# Patient Record
Sex: Female | Born: 1940 | Race: White | Hispanic: No | Smoking: Never smoker
Health system: Southern US, Community
[De-identification: ages and names within clinical notes are randomized; demographics above are authoritative.]

## PROBLEM LIST (undated history)

## (undated) DIAGNOSIS — Z9889 Other specified postprocedural states: Secondary | ICD-10-CM

## (undated) DIAGNOSIS — M1990 Unspecified osteoarthritis, unspecified site: Secondary | ICD-10-CM

## (undated) DIAGNOSIS — G709 Myoneural disorder, unspecified: Secondary | ICD-10-CM

## (undated) DIAGNOSIS — I480 Paroxysmal atrial fibrillation: Secondary | ICD-10-CM

## (undated) DIAGNOSIS — M5136 Other intervertebral disc degeneration, lumbar region: Secondary | ICD-10-CM

## (undated) DIAGNOSIS — M51369 Other intervertebral disc degeneration, lumbar region without mention of lumbar back pain or lower extremity pain: Secondary | ICD-10-CM

## (undated) DIAGNOSIS — I10 Essential (primary) hypertension: Secondary | ICD-10-CM

## (undated) DIAGNOSIS — R112 Nausea with vomiting, unspecified: Secondary | ICD-10-CM

## (undated) DIAGNOSIS — R7989 Other specified abnormal findings of blood chemistry: Secondary | ICD-10-CM

## (undated) HISTORY — DX: Unspecified osteoarthritis, unspecified site: M19.90

## (undated) HISTORY — PX: VEIN LIGATION AND STRIPPING: SHX2653

## (undated) HISTORY — PX: APPENDECTOMY: SHX54

## (undated) HISTORY — PX: TONSILLECTOMY: SHX5217

## (undated) HISTORY — PX: BUNIONECTOMY: SHX129

## (undated) HISTORY — PX: ABDOMINAL HYSTERECTOMY: SHX81

## (undated) HISTORY — DX: Myoneural disorder, unspecified: G70.9

## (undated) HISTORY — PX: TONSILLECTOMY: SUR1361

## (undated) HISTORY — PX: CHOLECYSTECTOMY: SHX55

## (undated) HISTORY — DX: Other specified abnormal findings of blood chemistry: R79.89

## (undated) HISTORY — DX: Essential (primary) hypertension: I10

## (undated) HISTORY — DX: Paroxysmal atrial fibrillation: I48.0

---

## 2000-10-24 ENCOUNTER — Ambulatory Visit (HOSPITAL_COMMUNITY): Admission: RE | Admit: 2000-10-24 | Discharge: 2000-10-24 | Payer: Self-pay | Admitting: Family Medicine

## 2000-10-24 ENCOUNTER — Encounter: Payer: Self-pay | Admitting: Family Medicine

## 2001-02-11 ENCOUNTER — Ambulatory Visit (HOSPITAL_BASED_OUTPATIENT_CLINIC_OR_DEPARTMENT_OTHER): Admission: RE | Admit: 2001-02-11 | Discharge: 2001-02-11 | Payer: Self-pay | Admitting: Podiatry

## 2001-09-08 ENCOUNTER — Encounter: Payer: Self-pay | Admitting: Family Medicine

## 2001-09-08 ENCOUNTER — Ambulatory Visit (HOSPITAL_COMMUNITY): Admission: RE | Admit: 2001-09-08 | Discharge: 2001-09-08 | Payer: Self-pay | Admitting: Family Medicine

## 2001-10-31 ENCOUNTER — Encounter: Payer: Self-pay | Admitting: Family Medicine

## 2001-10-31 ENCOUNTER — Ambulatory Visit (HOSPITAL_COMMUNITY): Admission: RE | Admit: 2001-10-31 | Discharge: 2001-10-31 | Payer: Self-pay | Admitting: Family Medicine

## 2002-01-21 ENCOUNTER — Encounter: Payer: Self-pay | Admitting: Otolaryngology

## 2002-01-21 ENCOUNTER — Ambulatory Visit (HOSPITAL_COMMUNITY): Admission: RE | Admit: 2002-01-21 | Discharge: 2002-01-21 | Payer: Self-pay | Admitting: Otolaryngology

## 2002-05-18 ENCOUNTER — Ambulatory Visit: Admission: RE | Admit: 2002-05-18 | Discharge: 2002-05-18 | Payer: Self-pay | Admitting: *Deleted

## 2002-05-18 ENCOUNTER — Encounter: Payer: Self-pay | Admitting: *Deleted

## 2002-11-03 ENCOUNTER — Encounter: Payer: Self-pay | Admitting: Family Medicine

## 2002-11-03 ENCOUNTER — Ambulatory Visit (HOSPITAL_COMMUNITY): Admission: RE | Admit: 2002-11-03 | Discharge: 2002-11-03 | Payer: Self-pay | Admitting: Family Medicine

## 2002-11-04 ENCOUNTER — Encounter: Payer: Self-pay | Admitting: Family Medicine

## 2002-11-04 ENCOUNTER — Ambulatory Visit (HOSPITAL_COMMUNITY): Admission: RE | Admit: 2002-11-04 | Discharge: 2002-11-04 | Payer: Self-pay | Admitting: Family Medicine

## 2003-11-15 ENCOUNTER — Ambulatory Visit (HOSPITAL_COMMUNITY): Admission: RE | Admit: 2003-11-15 | Discharge: 2003-11-15 | Payer: Self-pay | Admitting: Family Medicine

## 2004-11-15 ENCOUNTER — Ambulatory Visit (HOSPITAL_COMMUNITY): Admission: RE | Admit: 2004-11-15 | Discharge: 2004-11-15 | Payer: Self-pay | Admitting: Family Medicine

## 2005-02-16 ENCOUNTER — Encounter (INDEPENDENT_AMBULATORY_CARE_PROVIDER_SITE_OTHER): Payer: Self-pay | Admitting: Internal Medicine

## 2005-02-16 ENCOUNTER — Ambulatory Visit: Payer: Self-pay | Admitting: Internal Medicine

## 2005-02-16 ENCOUNTER — Ambulatory Visit (HOSPITAL_COMMUNITY): Admission: RE | Admit: 2005-02-16 | Discharge: 2005-02-16 | Payer: Self-pay | Admitting: Internal Medicine

## 2005-11-16 ENCOUNTER — Ambulatory Visit (HOSPITAL_COMMUNITY): Admission: RE | Admit: 2005-11-16 | Discharge: 2005-11-16 | Payer: Self-pay | Admitting: Family Medicine

## 2005-11-27 ENCOUNTER — Encounter: Admission: RE | Admit: 2005-11-27 | Discharge: 2005-11-27 | Payer: Self-pay | Admitting: Family Medicine

## 2006-11-20 ENCOUNTER — Encounter: Admission: RE | Admit: 2006-11-20 | Discharge: 2006-11-20 | Payer: Self-pay | Admitting: Family Medicine

## 2007-07-22 ENCOUNTER — Ambulatory Visit: Payer: Self-pay | Admitting: Orthopedic Surgery

## 2007-07-22 DIAGNOSIS — M722 Plantar fascial fibromatosis: Secondary | ICD-10-CM | POA: Insufficient documentation

## 2007-08-27 ENCOUNTER — Ambulatory Visit: Payer: Self-pay | Admitting: Orthopedic Surgery

## 2007-09-07 ENCOUNTER — Inpatient Hospital Stay (HOSPITAL_COMMUNITY): Admission: EM | Admit: 2007-09-07 | Discharge: 2007-09-08 | Payer: Self-pay | Admitting: Emergency Medicine

## 2007-09-07 ENCOUNTER — Ambulatory Visit: Payer: Self-pay | Admitting: Cardiology

## 2007-09-08 ENCOUNTER — Encounter (INDEPENDENT_AMBULATORY_CARE_PROVIDER_SITE_OTHER): Payer: Self-pay | Admitting: Internal Medicine

## 2007-09-11 ENCOUNTER — Encounter (HOSPITAL_COMMUNITY): Admission: RE | Admit: 2007-09-11 | Discharge: 2007-10-11 | Payer: Self-pay | Admitting: Cardiology

## 2007-09-11 ENCOUNTER — Ambulatory Visit: Payer: Self-pay | Admitting: Internal Medicine

## 2007-09-23 ENCOUNTER — Ambulatory Visit: Payer: Self-pay | Admitting: Cardiology

## 2007-10-14 ENCOUNTER — Ambulatory Visit: Payer: Self-pay | Admitting: Orthopedic Surgery

## 2007-11-21 ENCOUNTER — Encounter: Admission: RE | Admit: 2007-11-21 | Discharge: 2007-11-21 | Payer: Self-pay | Admitting: Family Medicine

## 2008-04-27 ENCOUNTER — Ambulatory Visit: Payer: Self-pay | Admitting: Cardiology

## 2008-08-02 ENCOUNTER — Ambulatory Visit: Payer: Self-pay | Admitting: Orthopedic Surgery

## 2008-10-06 ENCOUNTER — Ambulatory Visit: Payer: Self-pay | Admitting: Orthopedic Surgery

## 2008-10-13 ENCOUNTER — Encounter (HOSPITAL_COMMUNITY): Admission: RE | Admit: 2008-10-13 | Discharge: 2008-11-12 | Payer: Self-pay | Admitting: Orthopedic Surgery

## 2008-10-28 ENCOUNTER — Encounter: Payer: Self-pay | Admitting: Orthopedic Surgery

## 2008-11-08 ENCOUNTER — Ambulatory Visit: Payer: Self-pay | Admitting: Orthopedic Surgery

## 2008-11-30 ENCOUNTER — Encounter: Admission: RE | Admit: 2008-11-30 | Discharge: 2008-11-30 | Payer: Self-pay | Admitting: Family Medicine

## 2008-12-28 ENCOUNTER — Encounter (INDEPENDENT_AMBULATORY_CARE_PROVIDER_SITE_OTHER): Payer: Self-pay | Admitting: *Deleted

## 2008-12-28 LAB — CONVERTED CEMR LAB
CO2: 20 meq/L
Calcium: 9.3 mg/dL
Creatinine, Ser: 0.92 mg/dL
Glucose, Bld: 81 mg/dL
Potassium: 4.1 meq/L
Sodium: 141 meq/L

## 2009-07-15 ENCOUNTER — Encounter: Payer: Self-pay | Admitting: Orthopedic Surgery

## 2009-07-26 ENCOUNTER — Encounter (INDEPENDENT_AMBULATORY_CARE_PROVIDER_SITE_OTHER): Payer: Self-pay | Admitting: *Deleted

## 2009-07-27 DIAGNOSIS — I10 Essential (primary) hypertension: Secondary | ICD-10-CM | POA: Insufficient documentation

## 2009-07-28 ENCOUNTER — Ambulatory Visit: Payer: Self-pay | Admitting: Cardiology

## 2009-07-28 DIAGNOSIS — R609 Edema, unspecified: Secondary | ICD-10-CM | POA: Insufficient documentation

## 2009-08-17 ENCOUNTER — Ambulatory Visit: Payer: Self-pay | Admitting: Orthopedic Surgery

## 2009-08-19 ENCOUNTER — Telehealth: Payer: Self-pay | Admitting: Orthopedic Surgery

## 2009-08-22 ENCOUNTER — Ambulatory Visit (HOSPITAL_COMMUNITY): Admission: RE | Admit: 2009-08-22 | Discharge: 2009-08-22 | Payer: Self-pay | Admitting: Orthopedic Surgery

## 2009-08-23 ENCOUNTER — Telehealth: Payer: Self-pay | Admitting: Orthopedic Surgery

## 2009-08-24 ENCOUNTER — Encounter (INDEPENDENT_AMBULATORY_CARE_PROVIDER_SITE_OTHER): Payer: Self-pay | Admitting: *Deleted

## 2009-08-25 ENCOUNTER — Telehealth: Payer: Self-pay | Admitting: Orthopedic Surgery

## 2009-09-26 ENCOUNTER — Encounter: Payer: Self-pay | Admitting: Orthopedic Surgery

## 2009-10-17 ENCOUNTER — Ambulatory Visit: Payer: Self-pay | Admitting: Orthopedic Surgery

## 2009-10-17 DIAGNOSIS — S93409A Sprain of unspecified ligament of unspecified ankle, initial encounter: Secondary | ICD-10-CM | POA: Insufficient documentation

## 2009-10-17 DIAGNOSIS — S93609A Unspecified sprain of unspecified foot, initial encounter: Secondary | ICD-10-CM | POA: Insufficient documentation

## 2009-10-31 ENCOUNTER — Ambulatory Visit: Payer: Self-pay | Admitting: Orthopedic Surgery

## 2009-12-02 ENCOUNTER — Ambulatory Visit (HOSPITAL_COMMUNITY): Admission: RE | Admit: 2009-12-02 | Discharge: 2009-12-02 | Payer: Self-pay | Admitting: Family Medicine

## 2010-01-17 DIAGNOSIS — R7989 Other specified abnormal findings of blood chemistry: Secondary | ICD-10-CM | POA: Insufficient documentation

## 2010-01-17 HISTORY — DX: Other specified abnormal findings of blood chemistry: R79.89

## 2010-04-09 ENCOUNTER — Encounter: Payer: Self-pay | Admitting: Orthopedic Surgery

## 2010-04-20 NOTE — Miscellaneous (Signed)
Summary: ECHO 09/08/2007  Clinical Lists Changes  Observations: Added new observation of NKA: F (07/26/2009 14:45) Added new observation of ALLERGY REV: Done (07/26/2009 14:45) Added new observation of MEDRECON: current updated (07/26/2009 14:45) Added new observation of ECHOINTERP:  SUMMARY   -  Overall left ventricular systolic function was normal. There were         no left ventricular regional wall motion abnormalities. Left         ventricular wall thickness was mildly increased.   -  The effective orifice of mitral regurgitation by proximal         isovelocity surface area was 0.03 cm^2. The volume of mitral         regurgitation by proximal isovelocity surface area was 5 cc.   -  Left atrial size was at the upper limits of normal.   -  The estimated peak right ventricular systolic pressure was very         mildly increased.     ---------------------------------------------------------------    Prepared and Electronically Authenticated by    Wilton Bing M.D.   Confirmed 08-Sep-2007 11:50:05 (09/08/2007 14:51)      Echocardiogram  Procedure date:  09/08/2007  Findings:       SUMMARY   -  Overall left ventricular systolic function was normal. There were         no left ventricular regional wall motion abnormalities. Left         ventricular wall thickness was mildly increased.   -  The effective orifice of mitral regurgitation by proximal         isovelocity surface area was 0.03 cm^2. The volume of mitral         regurgitation by proximal isovelocity surface area was 5 cc.   -  Left atrial size was at the upper limits of normal.   -  The estimated peak right ventricular systolic pressure was very         mildly increased.     ---------------------------------------------------------------    Prepared and Electronically Authenticated by    Prince Edward Bing M.D.   Confirmed 08-Sep-2007 11:50:05   Current Medications (verified): 1)  Prinzide 10-12.5 Mg  Tabs  (Lisinopril-Hydrochlorothiazide) 2)  Allegra 180 Mg  Tabs (Fexofenadine Hcl) 3)  Flonase 50 Mcg/act  Susp (Fluticasone Propionate) 4)  Metoprolol Tartrate 5 Mg/38ml Soln (Metoprolol Tartrate) 5)  Gabapentin 100 Mg Caps (Gabapentin) .Marland Kitchen.. 1tonight   2 Tomorrow Night Then   3 Each Night 6)  Darvocet-N 100 100-650 Mg Tabs (Propoxyphene N-Apap) .Marland Kitchen.. 1 By Mouth Q 4 As Needed Pain 7)  Lyrica 50 Mg Caps (Pregabalin) .Marland Kitchen.. 1 By Mouth At Bedtime  Allergies (verified): 1)  ! Penicillin 2)  ! Sulfa 3)  ! Steroids

## 2010-04-20 NOTE — Assessment & Plan Note (Signed)
Summary: past due for 1 yr f/u per pt phone call/tg   Visit Type:  1 yr follow up  Primary Provider:  Dr. Wyvonnia Lora   History of Present Illness: [name] returns to the office as scheduled for continuing evaluation of paroxysmal atrial fibrillation and hypertension.  Since her last visit, she has done extremely well.  She denies all cardiopulmonary symptoms except for mild intermittent ankle edema.  She has noted no palpitations.  In retrospect, she wonders if her single episode of atrial fibrillation was related to a course of prednisone that she took at that time.  She has recently taken a second course without any adverse effects.  Current Medications (verified): 1)  Prinzide 10-12.5 Mg  Tabs (Lisinopril-Hydrochlorothiazide) .... .qdtab 2)  Allegra 180 Mg  Tabs (Fexofenadine Hcl) .... Take 1 Tablet By Mouth Once A Day 3)  Metoprolol Tartrate 25 Mg Tabs (Metoprolol Tartrate) .... Take One Tablet By Mouth Twice A Day 4)  Aspirin 81 Mg Tbec (Aspirin) .... Take One Tablet By Mouth Daily 5)  Super Calcium/d 600-125 Mg-Unit Tabs (Calcium-Vitamin D) .... Take 1 Tablet By Mouth Once A Day 6)  Centrum Silver  Tabs (Multiple Vitamins-Minerals) .... Take 1 Tablet By Mouth Once A Day  Allergies (verified): 1)  ! Penicillin 2)  ! Sulfa 3)  ! Steroids  Past History:  PMH, FH, and Social History reviewed and updated.  Review of Systems       The patient complains of peripheral edema.  The patient denies anorexia, weight loss, weight gain, chest pain, syncope, dyspnea on exertion, prolonged cough, headaches, and abdominal pain.    Vital Signs:  Patient profile:   70 year old female Height:      62 inches Weight:      168 pounds BMI:     30.84 O2 Sat:      93 % on Room air  Vitals Entered By: Dreama Saa, CNA (Jul 28, 2009 1:45 PM)  O2 Flow:  Room air  Physical Exam  General:   General-Well developed; no acute distress:   Neck-No JVD; no carotid bruits: Lungs-No  tachypnea, no rales; no rhonchi; no wheezes: Cardiovascular-normal PMI; normal S1 and S2: Abdomen-BS normal; soft and non-tender without masses or organomegaly:  Musculoskeletal-No deformities, no cyanosis or clubbing: Neurologic-Normal cranial nerves; symmetric strength and tone:  Skin-Warm, no significant lesions: Extremities-Nl distal pulses; 1/2 ankle edema:     Impression & Recommendations:  Problem # 1:  ATRIAL FIBRILLATION-PAROXYSMAL (ICD-427.31) No clinical evidence for recurrent arrhythmia.  Thromboembolic prophylaxis with aspirin remains appropriate therapy.  She will call or seek care should palpitations recur.  We discussed possible events, medications and beverages that might contribute to recurrence of atrial arrhythmias.  She will avoid these.  Problem # 2:  HYPERTENSION, BENIGN (ICD-401.1) Blood pressure control is excellent; current medications will be continued.  I will reassess this nice woman in one year.  Problem # 3:  PEDAL EDEMA (ICD-782.3) Patient follows a salt restricted diet for her hypertension.  Approaches to excluding even more sodium chloride from her diet were discussed.  She will also elevate legs when possible.  Use of compression stockings will be deferred.  Patient Instructions: 1)  Your physician recommends that you schedule a follow-up appointment in: 1 yr follow up 2)  Your physician has requested that you limit the intake of sodium (salt) in your diet to two grams daily. Please see [facility] handout. 3)  Leg Elevation as Needed

## 2010-04-20 NOTE — Assessment & Plan Note (Signed)
Summary: LEFT HIP HURTING AGAIN NEEDS XR/UMR/BSF   Visit Type:  Follow-up Primary Provider:  Dr. Wyvonnia Lora  CC:  left hip pain.  History of Present Illness: this 70 year old female previously treated for sciatic nerve impingement LEFT lower extremity presents back for increasing pain with radicular symptoms, reports some mild weakness in the LEFT leg as well  She did not tolerate prednisone and made her heart rate go up his results and she's been off prednisone.  She did take some Neurontin but when her doctor advised her to increase the dose she became wary of the side effects   MEDS previously: Neurontin helped some.  Is now taking Neurontin 100 and it helped for a while.  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 Penicillin 2)  ! Sulfa 3)  ! 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  Past Surgical History: Last updated: 07/27/2009 Venous ligation and resection Appendectomy Cholecystectomy Hysterectomy withoutno oophorectomy Bunionectomy-bilateral  Family History: Last updated: 07/27/2009 Father has conduction system disease requiring pacemaker      implantation Mother died at age 53 with dementia Siblings: 1 sister with a history of cardiac disease and pulmonary embolism  Social History: Last updated: 07/27/2009 Married Employment-secretary Tobacco-none Alcohol-none  Risk Factors: Caffeine Use: 0 (07/22/2007)  Risk  Factors: Smoking Status: never (07/22/2007)  Review of Systems      See HPI  Physical Exam  Skin:  intact without lesions or rashes Psych:  alert and cooperative; normal mood and affect; normal attention span and concentration   Detailed Back/Spine Exam  General:    Well-developed, well-nourished, in no acute distress; alert and oriented x 3.    Gait:    Normal heel-toe gait pattern bilaterally.    Skin:    Intact with no erythema; no scarring.    Inspection:    there is no swelling or deformity.  There is tenderness around the sciatic nerve in the LEFT gluteal region  Vascular:    dorsalis pedis and posterior tibial pulses 2+ and symmetric, capillary refill < 2 seconds, normal hair pattern, no evidence of ischemia.   Lumbosacral Exam:  Inspection-deformity:    Normal Palpation-spinal tenderness:  Normal Lying Straight Leg Raise:    Right:  negative    Left:  negative Sitting Straight Leg Raise:    Right:  negative    Left:  positive Reverse Straight Leg Raise:    Right:  negative    Left:  negative     muscle function is normal in both lower extremities, reflexes are normal.   Impression & Recommendations:  Problem # 1:  DEGENERATIVE JOINT DISEASE-LUMBOSACRAL SPINE (ICD-715.90) Assessment Deteriorated  recommend MRI lumbar spine, milligrams gabapentin at night  Orders: Est. Patient Level III (82956)  Patient Instructions: 1)  MRI L spine 2)  call with results 3)  300mg  gabapentin hs

## 2010-04-20 NOTE — Miscellaneous (Signed)
Summary: NUCLEAR STUDY 09/11/2007  Clinical Lists Changes  Observations: Added new observation of NUCLEAR NOS:   09/12/07 - DUPLICATE COPY for exam association in [facility] - No change from   original report.   Clinical Data: The patient is a 70 year old admitted with    paroxysmal atrial fibrillation and chest pain test to evaluate rule    out ischemia. The patient underwent adenosine stress testing    baseline EKG showed sinus rhythm, 61 beats per minute baseline    blood pressure 110/62.     The patient was infused with adenosine per protocol. EKG showed no    ST changes to suggest ischemia.     Radiopharmaceutical: The study was not 1-day rest stress protocol    the patient was injected with 10 mCi technetium 99 labeled Myoview    rest, 30 mCi thick [place] 99 technetium 99 labeled Myoview at    stress. The initial stress images there was thinning in the    inferior and inferolateral walls (base mid and minimally distal).    In the recovery images there is no significant change in this.     Note on review of the raw data there was breast, diaphragm and gut    activity surrounding the heart.     On gating LVEF was calculated at 77% with normal wall motion. The    inferior/inferolateral changes most likely reflect soft tissue    attenuation and probable normal perfusion, no evidence for    ischemia.     Comparison:     Findings:     IMPRESSION: (09/11/2007 14:44)      Nuclear Study  Procedure date:  09/11/2007  Findings:        09/12/07 - DUPLICATE COPY for exam association in RIS - No change from   original report.   Clinical Data: The patient is a 70 year old admitted with    paroxysmal atrial fibrillation and chest pain test to evaluate rule    out ischemia. The patient underwent adenosine stress testing    baseline EKG showed sinus rhythm, 61 beats per minute baseline    blood pressure 110/62.     The patient was infused with adenosine per protocol. EKG showed no   ST changes to suggest ischemia.     Radiopharmaceutical: The study was not 1-day rest stress protocol    the patient was injected with 10 mCi technetium 99 labeled Myoview    rest, 30 mCi thick Mission 99 technetium 99 labeled Myoview at    stress. The initial stress images there was thinning in the    inferior and inferolateral walls (base mid and minimally distal).    In the recovery images there is no significant change in this.     Note on review of the raw data there was breast, diaphragm and gut    activity surrounding the heart.     On gating LVEF was calculated at 77% with normal wall motion. The    inferior/inferolateral changes most likely reflect soft tissue    attenuation and probable normal perfusion, no evidence for    ischemia.     Comparison:     Findings:     IMPRESSION:

## 2010-04-20 NOTE — Letter (Signed)
Summary: History form  History form   Imported By: Jacklynn Ganong 10/17/2009 15:48:13  _____________________________________________________________________  External Attachment:    Type:   Image     Comment:   External Document

## 2010-04-20 NOTE — Progress Notes (Signed)
Summary: Appointment with Dr. Eduard Clos  Phone Note From Other Clinic   Caller: Referral Coordinator Reason for Call: Schedule Patient Appt Summary of Call: Patient has an appointment with Dr. Eduard Clos on 08-24-09 for evaluation and, schedule injection for next week. Initial call taken by: Waldon Reining,  August 25, 2009 2:40 PM

## 2010-04-20 NOTE — Progress Notes (Signed)
Summary: Referral to Dr. Eduard Clos.  Phone Note Outgoing Call   Call placed by: Waldon Reining,  August 23, 2009 4:23 PM Call placed to: Specialist Action Taken: Information Sent Summary of Call: I faxed a referral for this patient to Dr. Eduard Clos for ESI series at L4-5.

## 2010-04-20 NOTE — Progress Notes (Signed)
Summary: MRI appt., Dr. Romeo Apple to call patient with results.  Phone Note Outgoing Call   Call placed by: Waldon Reining,  August 19, 2009 11:30 AM Call placed to: Patient Action Taken: Appt scheduled Summary of Call: I called to give the patient her MRI appointment at [facility] on 08-22-09 at 4:30. Patient has UMR, no precert is needed. Dr. Romeo Apple will call the patient with her results.

## 2010-04-20 NOTE — Assessment & Plan Note (Signed)
Summary: 2 WK RE-CK FOOT/UMR/CAF   Visit Type:  Follow-up Referring Provider:  self Primary Provider:  Dr. Wyvonnia Lora  CC:  RECHECK right FOOT.  History of Present Illness: this is a 2 week recheck regarding the patient's foot injury.  She was placed in a Cam Walker.  DOI 10/12/09.  Meds: Prinzide, Metoprolol, ASA, Calcium, Vitamins and Ultracet given by our office.  Today is 2 week recheck after boot, ice, med and cane.  Doing alot better, stopped taking pain med a week ago, no ice needed.        Allergies: 1)  ! Penicillin 2)  ! Sulfa 3)  ! Steroids  Physical Exam  Additional Exam:  she is ambulating much better her swelling has gone down there is little tenderness around the foot and ankle primarily at the anterior talofibular ligament.  Her range of motion is normal.  Her strength is good except for eversion mild weakness noted.  Her ankle is stable.  She has normal sensation and pulses   Impression & Recommendations:  Problem # 1:  FOOT SPRAIN, RIGHT (ICD-845.10) Assessment Improved  Orders: Est. Patient Level II (60454)  Patient Instructions: 1)  Remove the brace  2)  gradually return to normal activity  3)  Please schedule a follow-up appointment as needed.

## 2010-04-20 NOTE — Letter (Signed)
Summary: Previous notes brought by the patient  Previous notes brought by the patient   Imported By: Jacklynn Ganong 09/01/2009 14:59:28  _____________________________________________________________________  External Attachment:    Type:   Image     Comment:   External Document

## 2010-04-20 NOTE — Miscellaneous (Signed)
Summary: LABS BMP,12/28/2008  Clinical Lists Changes  Observations: Added new observation of CALCIUM: 9.3 mg/dL (95/28/4132 44:01) Added new observation of CREATININE: 0.92 mg/dL (02/72/5366 44:03) Added new observation of BUN: 21 mg/dL (47/42/5956 38:75) Added new observation of BG RANDOM: 81 mg/dL (64/33/2951 88:41) Added new observation of CO2 PLSM/SER: 20 meq/L (12/28/2008 15:52) Added new observation of CL SERUM: 104 meq/L (12/28/2008 15:52) Added new observation of K SERUM: 4.1 meq/L (12/28/2008 15:52) Added new observation of NA: 141 meq/L (12/28/2008 15:52)

## 2010-04-20 NOTE — Miscellaneous (Signed)
Summary: L4-5 esi referral [place]  Clinical Lists Changes  Orders: Added new Referral order of Misc. Referral (Misc. Ref) - Signed

## 2010-04-20 NOTE — Assessment & Plan Note (Signed)
Summary: NEW PROB/RT FOOT,TWISTED/NEEDS XRAY/UMR/CAF   Vital Signs:  Patient profile:   70 year old female Height:      62 inches Weight:      160 pounds Pulse rate:   64 / minute Resp:     16 per minute  Vitals Entered By: Fuller Canada MD (October 17, 2009 1:55 PM)  Visit Type:  new problem Referring Provider:  self Primary Provider:  Dr. Wyvonnia Lora  CC:  right foot pain.  History of Present Illness: I saw [name] in the office today for a  visit.  She is an ambidextrous 70 years old woman with the complaint of:  right foot pain.  the patient was coming down a step I believe twisted her foot her ankle rolled over and she has noticed increased pain especially over the weekend.  There is also some swelling and inability to weight-bear  The pain is constant associated with throbbing aching and swelling  DOI 10/12/09.  Xrays today in our office.  Meds: Prinzide, Metoprolol, ASA, Calcium, Vitamins.      Allergies: 1)  ! Penicillin 2)  ! Sulfa 3)  ! 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 Immunology:  Complains of seasonal allergies; denies sinus problems and allergic to bee stings.  The review of systems is negative for Constitutional, Cardiovascular, Respiratory, Gastrointestinal, Genitourinary, Neurologic, Endocrine, Psychiatric, Skin, HEENT, and Hemoatologic.  Physical Exam  Additional Exam:  normal development grooming and hygiene  Normal pulses and perfusion no swelling in the vascular tree the RIGHT foot is swollen as is the ankle.  Skin is intact no rashes.  Sensation is normal  He's awake alert and oriented x3  She is walking on her forefoot no pain on the hindfoot  Tenderness and swelling surrounding ankle and foot.  Ankle range of motion passively is normal actively is diminished by 10 each direction  Muscle tone is normal  The ankle is stable.  The point of maximal tenderness is UNDER THE 5TH MTT HEAD AND THE ATFL    Impression & Recommendations:  Problem # 1:  ANKLE SPRAIN (ICD-845.00) Assessment New  data ordered x-ray interpreted x-ray  Diagnosis new problem sprained foot sprain ankle  Risk mild risk recommend bracing and ordered prescription medication Ultracet  FOOT  x-rays  NEGATIVE FOOT INCL FIULA   Orders: Est. Patient Level IV (16109)  Problem # 2:  FOOT SPRAIN, RIGHT (ICD-845.10) Assessment: New  Orders: Est. Patient Level IV (60454) Foot x-ray complete, minimum 3 views (09811)  Medications Added to Medication List This Visit: 1)  Ultracet 37.5-325 Mg Tabs (Tramadol-acetaminophen) .Marland Kitchen.. 1 q 4 as needed  Patient  Instructions: 1)  weight bear as tolerated in the boot  2)  apply ice to foot as needed  3)  take pain medication as needed  4)  should take 2 -3 weeks  5)  use cane x 1 week  6)  DX:  MOST LIKELY A SPRAIN  7)  2 WEEKS F/U  Prescriptions: ULTRACET 37.5-325 MG TABS (TRAMADOL-ACETAMINOPHEN) 1 q 4 as needed  #60 x 1   Entered and Authorized by:   Fuller Canada MD   Signed by:   Fuller Canada MD on 10/17/2009   Method used:   Print then Give to Patient   RxID:   [id number]

## 2010-06-24 ENCOUNTER — Emergency Department (HOSPITAL_COMMUNITY): Payer: 59

## 2010-06-24 ENCOUNTER — Emergency Department (HOSPITAL_COMMUNITY)
Admission: EM | Admit: 2010-06-24 | Discharge: 2010-06-24 | Disposition: A | Discharge status: Home / Self Care | Payer: 59 | Attending: Emergency Medicine | Admitting: Emergency Medicine

## 2010-06-24 DIAGNOSIS — I10 Essential (primary) hypertension: Secondary | ICD-10-CM | POA: Insufficient documentation

## 2010-06-24 DIAGNOSIS — R079 Chest pain, unspecified: Secondary | ICD-10-CM | POA: Insufficient documentation

## 2010-06-24 DIAGNOSIS — Z9889 Other specified postprocedural states: Secondary | ICD-10-CM | POA: Insufficient documentation

## 2010-06-24 DIAGNOSIS — R110 Nausea: Secondary | ICD-10-CM | POA: Insufficient documentation

## 2010-06-24 DIAGNOSIS — R1013 Epigastric pain: Secondary | ICD-10-CM | POA: Insufficient documentation

## 2010-06-24 DIAGNOSIS — Z79899 Other long term (current) drug therapy: Secondary | ICD-10-CM | POA: Insufficient documentation

## 2010-06-24 LAB — BASIC METABOLIC PANEL
CO2: 24 mEq/L (ref 19–32)
Calcium: 9.1 mg/dL (ref 8.4–10.5)
Creatinine, Ser: 0.86 mg/dL (ref 0.4–1.2)
Glucose, Bld: 117 mg/dL — ABNORMAL HIGH (ref 70–99)
Potassium: 3.3 mEq/L — ABNORMAL LOW (ref 3.5–5.1)
Sodium: 138 mEq/L (ref 135–145)

## 2010-06-24 LAB — DIFFERENTIAL
Eosinophils Absolute: 0.1 10*3/uL (ref 0.0–0.7)
Eosinophils Relative: 1 % (ref 0–5)
Lymphocytes Relative: 39 % (ref 12–46)
Monocytes Absolute: 0.8 10*3/uL (ref 0.1–1.0)
Monocytes Relative: 11 % (ref 3–12)
Neutro Abs: 3.4 10*3/uL (ref 1.7–7.7)
Neutrophils Relative %: 48 % (ref 43–77)

## 2010-06-24 LAB — POCT CARDIAC MARKERS
CKMB, poc: <1 ng/mL — ABNORMAL LOW (ref 1.0–8.0)
Myoglobin, poc: 119 ng/mL (ref 12–200)
Troponin i, poc: <0.05 ng/mL (ref 0.00–0.09)
Troponin i, poc: <0.05 ng/mL (ref 0.00–0.09)

## 2010-06-24 LAB — CBC
Hemoglobin: 13.5 g/dL (ref 12.0–15.0)
MCH: 31.2 pg (ref 26.0–34.0)
Platelets: 184 10*3/uL (ref 150–400)

## 2010-07-18 ENCOUNTER — Other Ambulatory Visit: Payer: Self-pay | Admitting: Orthopedic Surgery

## 2010-07-18 DIAGNOSIS — G8929 Other chronic pain: Secondary | ICD-10-CM

## 2010-07-24 ENCOUNTER — Telehealth: Payer: Self-pay | Admitting: Radiology

## 2010-07-24 NOTE — Telephone Encounter (Signed)
I faxed a referral for this patient to Dr. Vear Clock at [facility] for hip pain.

## 2010-08-01 NOTE — Discharge Summary (Signed)
NAMEJILLIENNE, [name]                ACCOUNT NO.:  [id number]   MEDICAL RECORD NO.:  [id number]          PATIENT TYPE:  INP   LOCATION:  A317                          FACILITY:  [facility]   PHYSICIAN:  Osvaldo Shipper, MD     DATE OF BIRTH:  08/10/1940   DATE OF ADMISSION:  09/07/2007  DATE OF DISCHARGE:  06/22/2009LH                               DISCHARGE SUMMARY   Please review H and P dictated by Dr. Luciana Axe for details regarding the  patient's presenting illness.   The patient's PMD is Dr. Margo Common in [place], [place].   DISCHARGE DIAGNOSES:  1. Paroxysmal atrial fibrillation, currently in sinus rhythm.  2. History of hypertension.   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  The patient was in her usual state of health till day before  yesterday night when she started feeling chest discomfort and  palpitations.  The patient came into the ED where she was found to be in  rapid A-Fib with a rate of 150s.  The patient was put on Cardizem drip  and some time overnight she converted to sinus rhythm.  She had to  markers, one of which showed a mildly positive troponin and the other  showed a normal troponin.  CKs were okay.  TSH has come back at 0.207;  followup labs have been added.  Magnesium and was okay.  HbA1c was fine.  Rest of the labs showed mild hypokalemia which was also corrected.  So,  the patient had an EKG which showed study A-Fib with some nonspecific ST  changes.  EKG showed sinus rhythm with no significant abnormalities.  The patient was seen by [facility] today and they have cleared  her for discharge provided her markers today are okay.  She will be set  up for an outpatient stress test sometime this week.  Her Cardizem was  switched over to beta blockers.  She will be put on low-dose aspirin.   Review rest of her medical problems are stable including  hypertension.  She was on prednisone for sciatica and she will finish with that course  this morning and so she must stop taking that at home.   On the day of discharge, she is feeling much better.  She is in better  mood this morning.  Denies any chest pain or shortness of breath.   Vital signs are all stable.  Telemetry reveals sinus rhythm.  LUNGS:  Clear.  CARDIAC:  Exam was unremarkable.  ABDOMEN:  Soft.  So, overall she is stable for discharge.   DISCHARGE MEDICATIONS:  1. Aspirin 81 mg daily.  2. Metoprolol 25 mg twice a day.  3. Continue lisinopril, Allegra, Nasonex, Centrum Silver as before.      Doses are unknown.   FOLLOWUP:  For stress test sometime this week and then follow with  cardiology as thought necessary by cardiology.  Her TSH and free T4 also  pending so that would also require followup.  I have  explained to the  patient that by the time she had the stress test, the results should  come back and then the cardiologist can go over these results with her.   DIET:  Heart healthy diet.   PHYSICAL ACTIVITY:  Avoid straining, but otherwise no restrictions.  The  patient mentioned that she is going on a flight to [facility] this  weekend and she asked me if that will be okay.  I have told her that if  she is going to have the stress test this week then obviously we would  need to make sure that the stress test results are optimal.  Otherwise,  I do not see any problems with her going for this vacation.   Total time on this encounter about less than 30 minutes.      Osvaldo Shipper, MD  Electronically Signed     GK/MEDQ  D:  09/08/2007  T:  09/08/2007  Job:  [id number]   cc:   [name]. Dietrich Pates, MD, [facility]  [address]  [place], [place] [address]   [name]  Fax: [phone]   Gardiner Barefoot, MD

## 2010-08-01 NOTE — H&P (Signed)
[name], [name] NO.:  [id number]   MEDICAL RECORD NO.:  [id number]          PATIENT TYPE:  EMS   LOCATION:  ED                            FACILITY:  [facility]   PHYSICIAN:  Gardiner Barefoot, MD    DATE OF BIRTH:  10/14/1940   DATE OF ADMISSION:  09/07/2007  DATE OF DISCHARGE:  LH                              HISTORY & PHYSICAL   PRIMARY CARE PHYSICIAN:  In [place], unassigned here.   CHIEF COMPLAINTS:  Palpitations.   HISTORY OF PRESENT ILLNESS:  This is a 70 year old female with a history  of hypertension who was lying in bed, noted to have palpitations and  chest pain.  She reports never having had this before with no history of  atrial fibrillation.  She has otherwise been feeling her normal self  with no recent illnesses or fever.  She has been, however, treated for  sciatica recently with prednisone.  The patient denies any cough or  other symptoms.   PAST MEDICAL HISTORY:  1. Hypertension.  2. History of cholecystectomy.  3. Sciatica.  4. Plantar fasciitis.   MEDICATIONS:  1. Lisinopril.  2. Centrum vitamin.  3. Prednisone taper, which she is taking down 10 mg a day now.   ALLERGIES:  SULFA and PENICILLIN.   SOCIAL HISTORY:  She denies any tobacco use, denies alcohol or drugs.   FAMILY HISTORY:  No early cardiac death.  Her father does have a  pacemaker for unknown reason and is alive and 59.   REVIEW OF SYSTEMS:  Negative except as per the history of present  illness.   PHYSICAL EXAMINATION:  VITAL SIGNS:  Temperature is 97.7, pulse is 126-  172, BP 98-115/62-72, and O2 sat is 5%.  GENERAL:  The patient is awake, alert, and oriented x3, appears in no  acute distress.  CARDIOVASCULAR:  Irregularly irregular with no murmurs, rubs, or  gallops.  Lungs:  Clear to auscultation bilaterally.  ABDOMEN:  Soft, nontender, and nondistended.  Positive bowel sounds and  no hepatosplenomegaly.  EXTREMITIES:  No edema.   LABORATORY DATA:  WBC of 10,  hemoglobin 13, platelets 165, sodium 136,  potassium 3.4, chloride 104, bicarb 20, BUN 32, creatinine 0.98, glucose  153, CK total 77, troponin 0.01, chest x-ray with right lower lobe  infiltrate.   ASSESSMENT/PLAN:  New-onset atrial fibrillation.  1. Atrial fibrillation.  We will admit the patient to telemetry unit,      rule out MI, check a TSH and continue with Cardizem drip.  Also, we      will convert to p.o. Cardizem when her arrhythmias is stabilized.  2. Right lower lobe infiltrate.  The patient is having no symptoms      including no increased white count, no fever, no cough or other      symptoms suggestive of pneumonia.  This will need to be a to be      followed as an outpatient.  If she does develop some symptoms, we      will consider treatment for community-acquired pneumonia.  3. Sciatica.  The  patient has been on prednisone taper, which she is      close completing and we will therefore continue with 5 mg      prednisone daily for the next 2 days.  4. Hypokalemia.  We will replete with potassium and IV fluids.      Gardiner Barefoot, MD  Electronically Signed     RWC/MEDQ  D:  09/07/2007  T:  09/07/2007  Job:  [id number]

## 2010-08-01 NOTE — Assessment & Plan Note (Signed)
[facility]                       [place] CARDIOLOGY OFFICE NOTE   Cross, [name]                       MRN:          [id number]  DATE:09/23/2007                            DOB:          12/14/1940    CARDIOLOGIST:  Gerrit Friends. Dietrich Pates, MD, [facility]   PRIMARY CARE PHYSICIAN:  Dr. Wyvonnia Lora.   REASON FOR VISIT:  Post-hospitalization followup.   HISTORY OF PRESENT ILLNESS:  [name] is a 70 year old female patient  whom we saw in [facility] recently with paroxysmal atrial  fibrillation as well as chest pain.  She had one abnormal troponin, but  the rest of her cardiac enzymes were normal.  We set up for an  outpatient stress test.  She had an echocardiogram performed prior to  discharge.  Of note, she converted to a normal sinus rhythm with  diltiazem.  We transitioned her over to metoprolol.   Her echocardiogram revealed normal LV size and function.  Her adenosine  Myoview study was done on September 11, 2007.  This revealed an EF of 77%  with normal wall motion.  She had inferior and inferolateral changes  most likely secondary to soft tissue attenuation.  She had probable  normal perfusion and no evidence of ischemia.   The patient is doing well.  She denies palpitations, chest pain,  shortness of breath.   PHYSICAL EXAMINATION:  GENERAL:  She is a well nourished, well  developed, in no distress.  VITAL SIGNS:  Blood pressure is 104/70, pulse 68, weight 161 pounds.  HEENT:  Normal.  NECK:  Without JVD.  CARDIAC:  S1 and S2.  Regular rate and rhythm.  LUNGS:  Clear to auscultation bilaterally.  ABDOMEN:  Soft, nontender.  EXTREMITIES:  Without edema.  Calves soft, nontender.  SKIN:  Warm and dry.  NEUROLOGIC:  She is alert and oriented x3.  Cranial nerves II-XII are  grossly intact.   IMPRESSION:  1. Paroxysmal atrial fibrillation.      a.     CHADS 2 score of 1.      b.     Aspirin therapy only.  2. Recent nonischemic  Myoview study.  3. Hypertension.  4. Low TSH   PLAN:  1. [name] returns to the office day for followup.  Overall, she is      doing well.  She will continue on metoprolol therapy as well as      aspirin therapy.  2. She had a normal T4 and T3 on follow up blood work.  I have asked      her to follow up with her primary care physician to follow her      TFTs.  I suspect she may of had sick euthyroid.  3. She will be brought back in followup in the next 6 months or sooner      p.r.n.      Tereso Newcomer, PA-C  Electronically Signed      Gerrit Friends. Dietrich Pates, MD, [facility]  Electronically Signed   SW/MedQ  DD: 09/23/2007  DT: 09/24/2007  Job #: [id number]   cc:  Wyvonnia Lora

## 2010-08-01 NOTE — Consult Note (Signed)
[name], [name]                ACCOUNT NO.:  [id number]   MEDICAL RECORD NO.:  [id number]          PATIENT TYPE:  INP   LOCATION:  A317                          FACILITY:  [facility]   PHYSICIAN:  Gerrit Friends. Dietrich Pates, MD, FACCDATE OF BIRTH:  1940-04-28   DATE OF CONSULTATION:  DATE OF DISCHARGE:                                 CONSULTATION   CARDIOLOGIST:  Gerrit Friends. Dietrich Pates, MD, [facility].   PRIMARY CARE PHYSICIAN:  Dr. Wyvonnia Lora in [place].   REFERRING PHYSICIAN:  Dr. Osvaldo Shipper, MD, of [facility] hospital  team.   REASON FOR REFERRAL:  Atrial fibrillation.   HISTORY OF PRESENT ILLNESS:  [name] is a 70 year old female patient  with a history of hypertension who developed sudden onset of  palpitations around 1 o'clock yesterday morning while lying in bed.  She  has associated left chest ache as well as tingling in her left neck and  left arm.  She denies any associated nausea, diaphoresis, or syncope.  She decided to come to the emergency room for further evaluation.  In  the emergency room, she was noted to be in atrial fibrillation with  rapid ventricular rate with a heart rate in the 150s.  She was placed on  a diltiazem drip and eventually converted back to normal sinus rhythm  overnight.  She is currently in normal sinus rhythm now.  She notes she  feels well.  Prior to this episode, she had been somewhat debilitated  with recent bunionectomy of her bilateral feet.  She had previously been  working out on a almost daily basis.  When she was working out, she  denied any exertional chest heaviness or tightness or shortness of  breath.  She has been quite busy recently with her father, who is ill,  and is moving into a nursing home.   PAST MEDICAL HISTORY:  1. Hypertension.  2. Questionable history of hyperlipidemia.  3. Allergic rhinitis.  4. Sciatica right leg - she has recently been placed on prednisone      taper.  5. Status post recent bilateral bunionectomy.  6.  Status post total abdominal hysterectomy.  7. Status post right leg vein stripping.  8. Status post laparoscopic cholecystectomy.  9. Status post tonsillectomy.   MEDICATIONS PRIOR TO ADMISSION:  1. Lisinopril.  2. Allegra.  3. Nasonex.  4. Centrum.  5. Multivitamin.  6. Prednisone taper.   ALLERGIES:  1. SULFA.  2. PENICILLIN.   SOCIAL HISTORY:  The patient lives in [place] with her husband.  Her  husband is actually a patient of Dr. Nona Dell in [place].  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  She  denies tobacco or alcohol abuse.   FAMILY HISTORY:  Insignificant for premature CAD.   REVIEW OF SYSTEMS:  Please see HPI.  She denies any fevers, chills,  weight changes, headaches, sore throat, rash, orthopnea, PND, pedal  edema, cough, syncope, near syncope, dysuria, hematuria, melena, bright  blood per rectum, skin changes or hair changes.  Rest of the review of  systems are  negative.   PHYSICAL EXAMINATION:  GENERAL:  She is a well-nourished, well-developed  female.  VITAL SIGNS:  She has blood pressure  of 116/72, pulse 67, respirations  20, temperature 97.3, oxygen saturation is 96% on room air and weight is  73.48 kg.  HEENT:  Normal.  NECK:  Without JVD.  LYMPHATIC: Without lymphadenopathy.  ENDOCRINE: Without thyromegaly.  CARDIAC:  Normal S1 and S2.  Regular rate and rhythm with a 1/6 systolic  ejection murmur, best heard at the right lower sternal border as well as  the left lower sternal border.  LUNGS:  Clear to auscultation bilaterally.  SKIN:  Without rash.  ABDOMEN:  Soft and nontender with normoactive bowel sounds.  No  organomegaly.  EXTREMITIES:  Without clubbing, cyanosis, or edema.  MUSCULOSKELETAL:  Without joint deformity.  NEUROLOGIC:  She is alert and oriented x3.  Cranial II through XII are  grossly intact.   LABORATORY DATA:  Chest x-ray showed nothing acute.  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  A CT was recommended as  indicated by the radiologist.  EKG initially revealed atrial  fibrillation with a heart rate of 151, left axis deviation, 1 mm of  downsloping ST depression and one in AVL.  Followup EKG revealed normal  sinus rhythm with a heart rate of 75, left axis deviation, and no acute  changes.  Labs; white count 10,100, hemoglobin 13.4, hematocrit 38.5,  platelet count 165,000, sodium 141, potassium 4.2, BUN 12, creatinine  0.89, glucose 108, and TSH 0.207.  Cardiac markers; point-of-care marker  #1 showed CK-MB of 3.1, troponin-I 0.47, regular cardiac enzymes, #1 CK  77, CK-MB 2.2, and troponin-I 0.01   IMPRESSION:  1. Paroxysmal atrial fibrillation with rapid ventricular rate.      a.     Currently maintaining normal sinus rhythm.  2. Chest pain associated with paroxysmal atrial fibrillation with a      singular troponin-I abnormal.  3. Abnormal chest x-ray.  4. Hypertension.  5. Low thyroid-stimulating hormone.   PLAN:  The patient was also interviewed and examined by Dr. [place] Bing.  The patient's CHADS2 score is 1.  She can be treated long-  term with aspirin therapy at this point in time.  Her TSH is depressed  and she needs to follow up with free T4 and Free T3 and repeat TSH.  Her  EKG returned to normal when she returned to normal sinus rhythm.  Her  abnormal cardiac markers are of questionable significance.  A followup  set of markers will be checked.  She should be okay for discharge to  home today.  An echocardiogram has been done and we will  follow up on  that. Her diltiazem, they changed to metoprolol.  She can have a stress  test as an outpatient to rule out ischemia.  We will see her back in  followup as an outpatient after her stress test.   Thank you very much for the consultation. We will be glad to follow up  the patient throughout the remainder of  this admission.      Tereso Newcomer, PA-C      Gerrit Friends. Dietrich Pates, MD, [facility]  Electronically Signed    SW/MEDQ  D:  09/08/2007  T:  09/09/2007  Job:  [id number]   cc:   Wyvonnia Lora  Fax: [phone]   Osvaldo Shipper, MD

## 2010-08-01 NOTE — Letter (Signed)
April 27, 2008    Dr. Wyvonnia Lora  [address]  [place], [place] [address]   RE:  [name], [name]  MRN:  [id number]  /  DOB:  [birth year]   Dear Mindy Hua:   [name] returns to the office for continued assessment and treatment  of paroxysmal atrial fibrillation.  Since her hospitalization  approximately 8 months ago, she has done quite well.  She consumed some  caffeine 1 day and felt some palpitations, but otherwise she has been  asymptomatic from a cardiac standpoint.  She had her annual evaluation  with you around that time of her hospital admission, and was told that  everything looked good.  Blood pressure control has been excellent.  She  has been exercising on a regular basis.   CURRENT MEDICATIONS:  1. Calcium with vitamin D.  2. Aspirin 81 mg daily.  3. Metoprolol 25 mg b.i.d.  4. Lisinopril/HCT 20/12.5 mg daily.  5. Multivitamin.  6. She uses fexofenadine p.r.n., but this does not include a      decongestant.   A chemistry profile in October was normal with a potassium of 3.8.  TSH  remains borderline at 0.37, but free T4 and T3 were normal.   PHYSICAL EXAMINATION:  GENERAL:  Pleasant woman in no acute distress.  VITAL SIGNS:  The weight is 162, unchanged.  Blood pressure 110/75,  heart rate 63 and regular, and respirations 14.  NECK:  No jugular venous distention; no carotid bruits.  LUNGS:  Clear.  CARDIAC:  Distant first and second heart sounds.  ABDOMEN:  Soft and nontender; no organomegaly.  EXTREMITIES:  Normal distal pulses; no edema.   EKG:  Normal sinus rhythm; delayed R-wave progression - cannot exclude  previous anteroseptal myocardial infarction; left anterior fascicular  block.  Compared with a previous tracing of September 07, 2007, axis has  shifted leftwards and poor R-wave progression is now noted.   IMPRESSION:  [name] is doing well overall.  There has been no  definite recurrence of paroxysmal atrial fibrillation.  We reviewed the  events  around her initial presentation.  She was stressed and working  hard in a warm environment.  Hopefully, with excellent control of blood  pressure and increased physical activity, her arrhythmia will not recur.  Treatment with lisinopril can also reduce the risk of atrial  fibrillation.  I will reassess this nice woman in 1 year, sooner if  symptoms recur.    Sincerely,      Gerrit Friends. Dietrich Pates, MD, [facility]  Electronically Signed    RMR/MedQ  DD: 04/27/2008  DT: 04/28/2008  Job #: [id number]

## 2010-08-04 NOTE — Op Note (Signed)
[name], [name]                ACCOUNT NO.:  [id number]   MEDICAL RECORD NO.:  [id number]          PATIENT TYPE:  AMB   LOCATION:  DAY                           FACILITY:  [facility]   PHYSICIAN:  Lionel December, M.D.    DATE OF BIRTH:  06/22/1940   DATE OF PROCEDURE:  02/16/2005  DATE OF DISCHARGE:                                 OPERATIVE REPORT   PROCEDURE:  Colonoscopy.   INDICATION:  The patient is a 70 year old Caucasian female who is here for  screening colonoscopy. Family history is negative for colorectal carcinoma.  Procedure and risks were reviewed with the patient, and informed consent was  obtained.   MEDICATIONS USED FOR CONSCIOUS SEDATION:  Demerol 75 mg IV, Versed 5 mg IV.   FINDINGS:  Procedure performed in endoscopy suite. The patient's vital signs  and O2 saturation were monitored during the procedure and remained stable.  The patient was placed in left lateral position. Rectal examination  performed. No abnormality noted on external or digital exam. Olympus  videoscope was placed in rectum and advanced under vision into sigmoid colon  and beyond. Preparation was satisfactory. Some difficulty encountered  passing the scope across from sigmoid and descending colon but further  intubation of cecum was easy. Cecal landmarks, i.e. appendiceal orifice and  ileocecal valve were well seen. Pictures taken for the record. As the scope  was withdrawn, colonic mucosa was carefully examined. There is a single  small polyp at sigmoid colon which was ablated via cold biopsy. Rectal  mucosa was normal. Scope was retroflexed to examine anorectal junction, and  a prominent anal papilla was noted. Pictures taken for the record. Endoscope  was straighten and withdrawn. The patient tolerated the procedure well.   FINAL DIAGNOSIS:  1.  Small sigmoid polyp which was ablated via cold biopsy.  2.  Prominent anal papilla. Unless she has problems or it protrudes out      often, she will  not need any intervention or banding.   RECOMMENDATIONS:  Standard instructions given. I will be contacting the  patient with biopsy results and further recommendations.      Lionel December, M.D.  Electronically Signed     NR/MEDQ  D:  02/16/2005  T:  02/16/2005  Job:  [id number]   cc:   Wyvonnia Lora  Fax: [phone]

## 2010-08-04 NOTE — Op Note (Signed)
[place]  Patient:    [name], [name] Visit Number: [id number] MRN: [id number]          Service Type: DSU Location: [facility] Attending Physician:  Severiano Gilbert Dictated by:   Eugenio Hoes Petrinitz, D.P.M. Proc. Date: 02/11/01 Admit Date:  02/11/2001                             Operative Report  DATE OF BIRTH:  April 20, 1918.  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  PROCEDURE:  Austin bunionectomy, bilateral feet, with K-wire pin fixation.  ANESTHESIA:  IV sedation with local.  HEMOSTASIS:  By ankle tourniquet.  ESTIMATED BLOOD LOSS:  Minimal.  SURGEON:  Tinnie Gens A. Petrinitz, D.P.M.  DESCRIPTION OF PROCEDURE:  The patient was brought to the OR and placed on the table in supine position.  Ankle tourniquets were placed superior malleoli bilateral feet with fish padding from contusion.  Upon achievement of sedation, local anesthesia administered.  The feet were prepped and draped in the usual aseptic manner.  The left foot was exsanguinated, Esmarch bandage, and ankle tourniquet inflated to 250 mmHg, following procedure performed:  Austin bunionectomy, left foot, with K-wire pin fixation.  Attention was drawn to the dorsum of the left foot.  A longitudinal skin incision was planned. This was centered between the extensor hallucis longus tendon and medial aspect of the foot.  This was executed with 15 blade and further sharp tissue dissection performed to the superficial deep fascial layer down to the joint capsule overlying the MPJ.  Joint capsule was then incised in a longitudinal fashion.  The periosteum was reflected with the [facility]. Further sharp tissue dissection performed on the medial side and all soft tissue dissected free from the hypertrophic osseous prominence.  This was reflected with the malleable  retractor.  Attention was then directed to the intermetatarsal space.  Blunt and sharp tissue dissection were performed down to the level of the base of the proximal phalanx.  The adductor tendon was identified.  This was incised in a coaxial fashion and dissected free from the attachment of the base of the proximal phalanx.  An incision was then performed superior to the fibular sesamoid.  All other attachments left intact.  The intermetatarsal space was flushed with copious amounts of antibiotic solution.  Attention was redirected medially.  The capsule was retracted with the malleable retractor.  The sagittal saw was utilized to resect the hypertrophic osseous prominence.  This was performed in a longitudinal fashion with great care to preserve the sagittal groove.  A V-type Austin osteotomy was then planned on the flat medial eminence.  This was apexed distal with wings exiting proximally.  This was executed from medial to lateral with the sagittal saw.  The metatarsal head was then transposed into a more corrected lateral anatomical position, impacted, and inspected.  The forefoot was loaded, and there was found to be good rectus position of the hallux with no risk of a varus.  The osteotomy was then fixated with two crossing K-wire pins driven from dorsal proximal to plantar distal and did not exit the joint space.  These were bent, cut, and rotated flush with the bone.  The remaining shelf on the medial side proximally was then resected flush with the metatarsal head.  All rough edges were rasped smooth.  The site was  flushed with copious amount of antibiotic solution, found to be free of bony spicules.  The forefoot was once again loaded and inspected, and there was found to be good anatomic position of the hallux with no risk of a varus.  This was further inspected and confirmed with the [facility] mobile x-ray unit.  Further antibiotic flush was performed, followed by capsular closure  with 3-0 Monocryl.  The deep and superficial fascial layers were closed with 4-0 Monocryl, followed by skin closure of 5-0 Monocryl in a subcuticular fashion.  The site was injected with 1 cc of dexamethasone phosphate 4 mg/ml.  Skin was prepped with benzoin and skin closure was further reinforced with Steri-Strips followed by Betadine prep, benzoin, and a dry sterile dressing.  This was reinforced with Ace bandage, and ankle tourniquet was deflated.  Capillary filling time was noted to be instantaneous in all digits of the left foot.  Attention was directed to the right foot.  The right foot was exsanguinated with Esmarch bandage and ankle tourniquet inflated to 250 mmHg.  The following procedure performed.  Procedure #2, Austin bunionectomy with K-wire pin fixation, right foot.  For all intents and purposes, the procedure on the right foot was identical to the left.  Upon completion of both procedures, the dressings were further reinforced with _____ compression, followed by Ace bandage compression dressing, and the patient was transported to the recovery room with all vital signs stable.  It was noted in recovery that capillary filling time was instantaneous in all digits, and I dispensed detailed written postoperative instructions, prescription, and return office visit four to seven days.  I advised patient and spouse to contact myself or emergency beeper service if questions or problems arise. Dictated by:   Eugenio Hoes Petrinitz, D.P.M. Attending Physician:  Severiano Gilbert DD:  02/11/01 TD:  02/11/01 Job: [id number]

## 2010-08-25 ENCOUNTER — Encounter: Payer: Self-pay | Admitting: Cardiology

## 2010-08-29 ENCOUNTER — Ambulatory Visit (INDEPENDENT_AMBULATORY_CARE_PROVIDER_SITE_OTHER): Payer: 59 | Admitting: Cardiology

## 2010-08-29 ENCOUNTER — Encounter: Payer: Self-pay | Admitting: *Deleted

## 2010-08-29 ENCOUNTER — Encounter: Payer: Self-pay | Admitting: Cardiology

## 2010-08-29 DIAGNOSIS — I4891 Unspecified atrial fibrillation: Secondary | ICD-10-CM

## 2010-08-29 DIAGNOSIS — R946 Abnormal results of thyroid function studies: Secondary | ICD-10-CM

## 2010-08-29 DIAGNOSIS — M1990 Unspecified osteoarthritis, unspecified site: Secondary | ICD-10-CM

## 2010-08-29 DIAGNOSIS — I480 Paroxysmal atrial fibrillation: Secondary | ICD-10-CM | POA: Insufficient documentation

## 2010-08-29 DIAGNOSIS — I10 Essential (primary) hypertension: Secondary | ICD-10-CM

## 2010-08-29 DIAGNOSIS — R7989 Other specified abnormal findings of blood chemistry: Secondary | ICD-10-CM

## 2010-08-29 NOTE — Progress Notes (Signed)
HPI : [name] returns to the office as scheduled for continued assessment and treatment of paroxysmal atrial fibrillation.  Since her initial episode, she has noted no palpitations nor other signs or symptoms suggestive of recurrent AF.  She was seen in the emergency department 2 months ago for localized chest discomfort in the mid substernal region.  Symptoms resolved spontaneously over the course of a few hours.  There was associated mild diaphoresis but no dyspnea nor nausea.  EKGs and cardiac markers were negative.  No specific therapy was undertaken, and symptoms have not recurred.   Current Outpatient Prescriptions on File Prior to Visit  Medication Sig Dispense Refill  . aspirin 81 MG EC tablet Take 81 mg by mouth daily.        . Calcium-Vitamin D 600-125 MG-UNIT TABS Take 1 tablet by mouth daily.        . metoprolol tartrate (LOPRESSOR) 25 MG tablet Take 25 mg by mouth 2 (two) times daily.        . Multiple Vitamins-Minerals (CENTRUM SILVER PO) Take by mouth daily.        Marland Kitchen DISCONTD: fexofenadine (ALLEGRA) 180 MG tablet Take 180 mg by mouth daily.        Marland Kitchen DISCONTD: lisinopril-hydrochlorothiazide (PRINZIDE,ZESTORETIC) 10-12.5 MG per tablet Take 1 tablet by mouth daily.        Marland Kitchen DISCONTD: traMADol-acetaminophen (ULTRACET) 37.5-325 MG per tablet Take 1 tablet by mouth every 6 (six) hours as needed.           Allergies  Allergen Reactions  . Penicillins   . Sulfonamide Derivatives       Past medical history, social history, and family history reviewed and updated.  ROS: Denies orthopnea, PND, exertional dyspnea, edema and syncope.  PHYSICAL EXAM: BP 135/81  Pulse 72  Ht 5\' 2"  (1.575 m)  Wt 166 lb (75.297 kg)  BMI 30.36 kg/m2  SpO2 92%  General-Well developed; no acute distress Body habitus-overweight Neck-No JVD; no carotid bruits Lungs-clear lung fields; resonant to percussion Cardiovascular-normal PMI; distant S1 and S2 Abdomen-normal bowel sounds; soft and non-tender  without masses or organomegaly Musculoskeletal-No deformities, no cyanosis or clubbing Neurologic-Normal cranial nerves; symmetric strength and tone Skin-Warm, no significant lesions Extremities-distal pulses intact; no edema  Rhythm Strip: Normal sinus rhythm; borderline first degree AV block  ASSESSMENT AND PLAN:

## 2010-08-29 NOTE — Assessment & Plan Note (Addendum)
No clinical recurrence of atrial fibrillation.  In the absence of any definite precipitant, I would expect her arrhythmia to recur eventually.  At the time of the event, there was concern regarding excessive physical activity, excessive heat in the environment and a course of prednisone, but none of these represent definite causes for her arrhythmia.  Laboratory studies from late 2011 were reviewed.  CBC and chemistry profile were normal.  Lipid profile was near optimal.  TSH was low at 0.10.  Free T3 and T4 were apparently checked a few years ago and verified to be normal, but a repeat study will be obtained.  In the absence of recurrent arrhythmia, annual cardiology followup will not be productive.  [name] will contact me should she have any further cardiology issues.

## 2010-08-29 NOTE — Patient Instructions (Signed)
Your physician recommends that you schedule a follow-up appointment in: as needed  

## 2010-08-29 NOTE — Assessment & Plan Note (Signed)
Blood pressure control has been excellent; current medications will be continued.  

## 2010-09-01 ENCOUNTER — Encounter: Payer: Self-pay | Admitting: Cardiology

## 2010-09-02 LAB — TSH: TSH: 0.345 u[IU]/mL — ABNORMAL LOW (ref 0.350–4.500)

## 2010-09-02 LAB — T4, FREE: Free T4: 1.19 ng/dL (ref 0.80–1.80)

## 2010-09-07 ENCOUNTER — Encounter: Payer: Self-pay | Admitting: *Deleted

## 2010-12-14 LAB — CK TOTAL AND CKMB (NOT AT ARMC)
Relative Index: INVALID
Total CK: 77

## 2010-12-14 LAB — BASIC METABOLIC PANEL
CO2: 26
Calcium: 8.7
Chloride: 104
Chloride: 111
GFR calc Af Amer: >60
GFR calc Af Amer: >60
GFR calc non Af Amer: 56 — ABNORMAL LOW
Glucose, Bld: 108 — ABNORMAL HIGH
Potassium: 3.4 — ABNORMAL LOW
Sodium: 136
Sodium: 141

## 2010-12-14 LAB — TSH
TSH: 0.163 — ABNORMAL LOW
TSH: 0.207 — ABNORMAL LOW

## 2010-12-14 LAB — CBC
HCT: 38.5
MCV: 91.4
RBC: 4.22
WBC: 10.1

## 2010-12-14 LAB — CARDIAC PANEL(CRET KIN+CKTOT+MB+TROPI)
CK, MB: 1.1
Relative Index: INVALID
Total CK: 28

## 2010-12-14 LAB — T4, FREE: Free T4: 1.61

## 2010-12-14 LAB — DIFFERENTIAL
Eosinophils Absolute: 0
Eosinophils Relative: 0
Lymphocytes Relative: 29
Lymphs Abs: 3
Monocytes Relative: 9

## 2010-12-14 LAB — POCT CARDIAC MARKERS
Myoglobin, poc: 91.7
Troponin i, poc: 0.47 — ABNORMAL HIGH

## 2010-12-14 LAB — HEMOGLOBIN A1C: Hgb A1c MFr Bld: 5.9

## 2010-12-14 LAB — TROPONIN I: Troponin I: 0.01

## 2010-12-14 LAB — MAGNESIUM: Magnesium: 1.9

## 2010-12-14 LAB — LIPID PANEL
Cholesterol: 142
HDL: 37 — ABNORMAL LOW

## 2010-12-18 ENCOUNTER — Other Ambulatory Visit (HOSPITAL_COMMUNITY): Payer: Self-pay | Admitting: Family Medicine

## 2010-12-18 DIAGNOSIS — Z139 Encounter for screening, unspecified: Secondary | ICD-10-CM

## 2010-12-26 ENCOUNTER — Ambulatory Visit (HOSPITAL_COMMUNITY)
Admission: RE | Admit: 2010-12-26 | Discharge: 2010-12-26 | Disposition: A | Discharge status: Home / Self Care | Payer: 59 | Source: Ambulatory Visit | Attending: Family Medicine | Admitting: Family Medicine

## 2010-12-26 DIAGNOSIS — Z139 Encounter for screening, unspecified: Secondary | ICD-10-CM

## 2010-12-26 DIAGNOSIS — Z1231 Encounter for screening mammogram for malignant neoplasm of breast: Secondary | ICD-10-CM | POA: Insufficient documentation

## 2010-12-28 ENCOUNTER — Ambulatory Visit (INDEPENDENT_AMBULATORY_CARE_PROVIDER_SITE_OTHER): Payer: 59 | Admitting: Otolaryngology

## 2011-01-11 ENCOUNTER — Ambulatory Visit (INDEPENDENT_AMBULATORY_CARE_PROVIDER_SITE_OTHER): Payer: 59 | Admitting: Otolaryngology

## 2011-01-11 DIAGNOSIS — R490 Dysphonia: Secondary | ICD-10-CM

## 2011-01-11 DIAGNOSIS — K219 Gastro-esophageal reflux disease without esophagitis: Secondary | ICD-10-CM

## 2011-05-15 ENCOUNTER — Ambulatory Visit (HOSPITAL_BASED_OUTPATIENT_CLINIC_OR_DEPARTMENT_OTHER): Payer: 59 | Admitting: Physical Medicine & Rehabilitation

## 2011-05-15 ENCOUNTER — Encounter: Payer: Self-pay | Admitting: Physical Medicine & Rehabilitation

## 2011-05-15 ENCOUNTER — Ambulatory Visit: Payer: 59 | Admitting: Physical Medicine & Rehabilitation

## 2011-05-15 ENCOUNTER — Encounter: Payer: 59 | Attending: Physical Medicine & Rehabilitation

## 2011-05-15 DIAGNOSIS — M545 Low back pain, unspecified: Secondary | ICD-10-CM | POA: Insufficient documentation

## 2011-05-15 DIAGNOSIS — M533 Sacrococcygeal disorders, not elsewhere classified: Secondary | ICD-10-CM

## 2011-05-15 DIAGNOSIS — G8929 Other chronic pain: Secondary | ICD-10-CM | POA: Insufficient documentation

## 2011-05-15 DIAGNOSIS — M48061 Spinal stenosis, lumbar region without neurogenic claudication: Secondary | ICD-10-CM | POA: Insufficient documentation

## 2011-05-15 DIAGNOSIS — M25559 Pain in unspecified hip: Secondary | ICD-10-CM

## 2011-05-15 MED ORDER — TRAMADOL HCL 50 MG PO TABS: 50.0000 mg | ORAL_TABLET | Freq: Three times a day (TID) | ORAL | Status: AC | PRN

## 2011-05-15 NOTE — Progress Notes (Signed)
Subjective:    Patient ID: [name], female    DOB: January 08, 1941, 71 y.o.   MRN: [id number]  HPI Two-year history of left-sided hip pain. 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 Patient has seen his medicine and rehabilitation physician in [place] and underwent 2 lumbar injections which were only partially helpful. She was later evaluated by anesthesiology pain management and underwent medial branch blocks in the lumbar area which helped back pain but did not feel helped hip pain. She has been evaluated by orthopedic surgeon. No hip x-rays have been reported. Physical therapy performed approximately 2 years ago was helpful. No recent exercise of any sort because of pain.  Pain Inventory Average Pain 10 Pain Right Now 5 My pain is sharp, tingling and aching  In the last 24 hours, has pain interfered with the following? General activity 3 Relation with others 2 What TIME of day is your pain at its worst? daytime Sleep (in general) Fair  Pain is worse with: walking and standing Pain improves with: rest Relief from Meds: 5  Mobility walk without assistance ability to climb steps?  yes do you drive?  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 Wyvonnia Lora       Review of Systems  Constitutional:       Night Sweats  HENT: Negative.   Eyes: Negative.   Respiratory: Negative.   Cardiovascular: Negative.   Gastrointestinal: Negative.   Genitourinary: Negative.   Musculoskeletal: Negative.   Skin: Negative.   Neurological: Negative.   Hematological: Negative.   Psychiatric/Behavioral: Negative.        Objective:   Physical Exam  Constitutional: She is oriented to person, place, and time. She appears well-developed and  well-nourished.  Neck: Normal range of motion.  Musculoskeletal:       Right hip: Normal.       Left hip: Normal.       Right knee: Normal.       Left knee: Normal.       Lumbar back: She exhibits decreased range of motion.       Back:       R PSIS tenderness worsens with FABER's SLR neg   Neurological: She is alert and oriented to person, place, and time. She has normal strength. No sensory deficit. Coordination normal.  Reflex Scores:      Patellar reflexes are 2+ on the right side and 2+ on the left side.      Achilles reflexes are 1+ on the right side and 1+ on the left side. Skin: Skin is warm and dry.  Psychiatric: She has a normal mood and affect. Her behavior is normal. Judgment and thought content normal.          Assessment & Plan:  1. Chronic low back, but, hip pain. Symptoms have progressed over time. It used to involve the left side and now the right side is affected even more so. Her MRI scan has been reviewed. It does show stenosis at the L4-L5 foramen left side greater than right side. This MRIs from 2 years ago and symptoms have changed. While the hip range of motion is normal. She does have a positive Faber's but this is involving mainly the right sacroiliac area. The patient has had partial relief  of her back pain with what I believe to be a radiofrequency procedure performed by Dr. Vear Clock. She's had some partial relief with what sounds to be epidural injections with Dr. Eduard Clos.  Given the diagnostic uncertainty would recommend repeat MRI as well as a PA pelvis. I will also do a diagnostic injection of her right sacroiliac area. She has some constant pain that she would like addressed and I will start her on some tramadol. We talked about exercise and I think she is able to go back to its stationary bicycling No formal physical therapy at this time. She can do her usual work activities.  Discussed with patient agree with plan. 45 minute visit half of which was  spent counseling and coordination of care. Consultation performed results 2 referring physician Dr. Margo Common

## 2011-05-15 NOTE — Patient Instructions (Signed)
Sacroiliac Joint Dysfunction The sacroiliac joint connects the lower part of the spine (the sacrum) with the bones of the pelvis. CAUSES  Sometimes, there is no obvious reason for sacroiliac joint dysfunction. Other times, it may occur   During pregnancy.   After injury, such as:   Car accidents.   Sport-related injuries.   Work-related injuries.   Due to one leg being shorter than the other.   Due to other conditions that affect the joints, such as:   Rheumatoid arthritis.   Gout.   Psoriasis.   Joint infection (septic arthritis).  SYMPTOMS  Symptoms may include:  Pain in the:   Lower back.   Buttocks.   Groin.   Thighs and legs.   Difficult sitting, standing, walking, lying, bending or lifting.  DIAGNOSIS  A number of tests may be used to help diagnose the cause of sacroiliac joint dysfunction, including:  Imaging tests to look for other causes of pain, including:   MRI.   CT scan.   Bone scan.   Diagnostic injection: During a special x-ray (called fluoroscopy), a needle is put into the sacroiliac joint. A numbing medicine is injected into the joint. If the pain is improved or stopped, the diagnosis of sacroiliac joint dysfunction is more likely.  TREATMENT  There are a number of types of treatment used for sacroiliac joint dysfunction, including:  Only take over-the-counter or prescription medicines for pain, discomfort, or fever as directed by your caregiver.   Medications to relax muscles.   Rest. Decreasing activity can help cut down on painful muscle spasms and allow the back to heal.   Application of heat or ice to the lower back may improve muscle spasms and soothe pain.   Brace. A special back brace, called a sacroiliac belt, can help support the joint while your back is healing.   Physical therapy can help teach comfortable positions and exercises to strengthen muscles that support the sacroiliac joint.   Cortisone injections. Injections  of steroid medicine into the joint can help decrease swelling and improve pain.   Hyaluronic acid injections. This chemical improves lubrication within the sacroiliac joint, thereby decreasing pain.   Radiofrequency ablation. A special needle is placed into the joint, where it burns away nerves that are carrying pain messages from the joint.   Surgery. Because pain occurs during movement of the joint, screws and plates may be installed in order to limit or prevent joint motion.  HOME CARE INSTRUCTIONS   Take all medications exactly as directed.   Follow instructions regarding both rest and physical activity, to avoid worsening the pain.   Do physical therapy exercises exactly as prescribed.  SEEK IMMEDIATE MEDICAL CARE IF:  You experience increasingly severe pain.   You develop new symptoms, such as numbness or tingling in your legs or feet.   You lose bladder or bowel control.  Document Released: 06/01/2008 Document Revised: 11/15/2010 Document Reviewed: 06/01/2008 Exit[employer] Patient Information 2012 [employer]. 

## 2011-05-16 ENCOUNTER — Telehealth: Payer: Self-pay | Admitting: Physical Medicine & Rehabilitation

## 2011-05-16 NOTE — Telephone Encounter (Signed)
Tramadol making her sick, nausea.  She checked with pharmacy and that is one of the side effects.  Can you prescribe something else?  [facility]

## 2011-05-16 NOTE — Telephone Encounter (Signed)
Please advise.  **Pt aware that it might be tomorrow before I call her back with Dr. Jodean Lima response to this. She agrees.**

## 2011-05-17 MED ORDER — NAPROXEN 375 MG PO TABS: 375.0000 mg | ORAL_TABLET | Freq: Two times a day (BID) | ORAL | Status: DC

## 2011-05-17 NOTE — Telephone Encounter (Signed)
Pt aware.

## 2011-05-17 NOTE — Telephone Encounter (Signed)
Order fpr naproxen to [facility] May hold tramadol

## 2011-05-18 ENCOUNTER — Ambulatory Visit (HOSPITAL_COMMUNITY)
Admission: RE | Admit: 2011-05-18 | Discharge: 2011-05-18 | Disposition: A | Discharge status: Home / Self Care | Payer: 59 | Source: Ambulatory Visit | Attending: Physical Medicine & Rehabilitation | Admitting: Physical Medicine & Rehabilitation

## 2011-05-18 DIAGNOSIS — M25559 Pain in unspecified hip: Secondary | ICD-10-CM | POA: Insufficient documentation

## 2011-05-18 DIAGNOSIS — M51379 Other intervertebral disc degeneration, lumbosacral region without mention of lumbar back pain or lower extremity pain: Secondary | ICD-10-CM | POA: Insufficient documentation

## 2011-05-18 DIAGNOSIS — M545 Low back pain, unspecified: Secondary | ICD-10-CM | POA: Insufficient documentation

## 2011-05-18 DIAGNOSIS — M79609 Pain in unspecified limb: Secondary | ICD-10-CM | POA: Insufficient documentation

## 2011-05-18 DIAGNOSIS — M48061 Spinal stenosis, lumbar region without neurogenic claudication: Secondary | ICD-10-CM

## 2011-05-18 DIAGNOSIS — M5137 Other intervertebral disc degeneration, lumbosacral region: Secondary | ICD-10-CM | POA: Insufficient documentation

## 2011-05-18 DIAGNOSIS — M47817 Spondylosis without myelopathy or radiculopathy, lumbosacral region: Secondary | ICD-10-CM | POA: Insufficient documentation

## 2011-05-18 DIAGNOSIS — M5126 Other intervertebral disc displacement, lumbar region: Secondary | ICD-10-CM | POA: Insufficient documentation

## 2011-05-18 DIAGNOSIS — M533 Sacrococcygeal disorders, not elsewhere classified: Secondary | ICD-10-CM

## 2011-06-04 ENCOUNTER — Ambulatory Visit (HOSPITAL_BASED_OUTPATIENT_CLINIC_OR_DEPARTMENT_OTHER): Payer: 59 | Admitting: Physical Medicine & Rehabilitation

## 2011-06-04 ENCOUNTER — Encounter: Payer: 59 | Attending: Physical Medicine & Rehabilitation

## 2011-06-04 ENCOUNTER — Encounter: Payer: Self-pay | Admitting: Physical Medicine & Rehabilitation

## 2011-06-04 VITALS — BP 127/77 | HR 80 | Resp 16 | Ht 62.0 in | Wt 162.0 lb

## 2011-06-04 DIAGNOSIS — M545 Low back pain, unspecified: Secondary | ICD-10-CM | POA: Insufficient documentation

## 2011-06-04 DIAGNOSIS — M533 Sacrococcygeal disorders, not elsewhere classified: Secondary | ICD-10-CM

## 2011-06-04 DIAGNOSIS — G8929 Other chronic pain: Secondary | ICD-10-CM | POA: Insufficient documentation

## 2011-06-04 DIAGNOSIS — M48061 Spinal stenosis, lumbar region without neurogenic claudication: Secondary | ICD-10-CM | POA: Insufficient documentation

## 2011-06-04 DIAGNOSIS — M25559 Pain in unspecified hip: Secondary | ICD-10-CM | POA: Insufficient documentation

## 2011-06-04 NOTE — Progress Notes (Signed)
Right sacroiliac injection under fluoroscopic guidance  Indication: Right Low back and buttocks pain not relieved by medication management and other conservative care.  Informed consent was obtained after describing risks and benefits of the procedure with the patient, this includes bleeding, bruising, infection, paralysis and medication side effects. The patient wishes to proceed and has given written consent. The patient was placed in a prone position. The lumbar and sacral area was marked and prepped with Betadine. A 25-gauge 1-1/2 inch needle was inserted into the skin and subcutaneous tissue and 1 mL of 1% lidocaine was injected. Then a 25-gauge 3 inch spinal needle was inserted under fluoroscopic guidance into the Right sacroiliac joint. AP and lateral images were utilized. Omnipaque 180x0.5 mL under live fluoroscopy demonstrated no intravascular uptake. Then a solution containing one ML of 40 mg per mL depomedrol and 2 ML of 2% lidocaine MPF was injected x1.5 mL. Patient tolerated the procedure well. Post procedure instructions were given. Please see post procedure form.

## 2011-06-04 NOTE — Patient Instructions (Signed)
Joint Injection Care After Refer to this sheet in the next few days. These instructions provide you with information on caring for yourself after you have had a joint injection. Your caregiver also may give you more specific instructions. Your treatment has been planned according to current medical practices, but problems sometimes occur. Call your caregiver if you have any problems or questions after your procedure. After any type of joint injection, it is not uncommon to experience:  Soreness, swelling, or bruising around the injection site.   Mild numbness, tingling, or weakness around the injection site caused by the numbing medicine used before or with the injection.  It also is possible to experience the following effects associated with the specific agent after injection:  Iodine-based contrast agents:   Allergic reaction (itching, hives, widespread redness, and swelling beyond the injection site).   Corticosteroids (These effects are rare.):   Allergic reaction.   Increased blood sugar levels (If you have diabetes and you notice that your blood sugar levels have increased, notify your caregiver).   Increased blood pressure levels.   Mood swings.   Hyaluronic acid in the use of viscosupplementation.   Temporary heat or redness.   Temporary rash and itching.   Increased fluid accumulation in the injected joint.  These effects all should resolve within a day after your procedure.  HOME CARE INSTRUCTIONS  Limit yourself to light activity the day of your procedure. Avoid lifting heavy objects, bending, stooping, or twisting.   Take prescription or over-the-counter pain medication as directed by your caregiver.   You may apply ice to your injection site to reduce pain and swelling the day of your procedure. Ice may be applied 3 to 4 times:   Put ice in a plastic bag.   Place a towel between your skin and the bag.   Leave the ice on for no longer than 15 to 20 minutes  each time.  SEEK IMMEDIATE MEDICAL CARE IF:   Pain and swelling get worse rather than better or extend beyond the injection site.   Numbness does not go away.   Blood or fluid continues to leak from the injection site.   You have chest pain.   You have swelling of your face or tongue.   You have trouble breathing or you become dizzy.   You develop a fever, chills, or severe tenderness at the injection site that last longer than 1 day.  MAKE SURE YOU:  Understand these instructions.   Watch your condition.   Get help right away if you are not doing well or if you get worse.  Document Released: 11/16/2010 Document Revised: 02/22/2011 Document Reviewed: 11/16/2010 Exit[employer] Patient Information 2012 [employer]. 

## 2011-06-04 NOTE — Progress Notes (Signed)
  PROCEDURE RECORD [facility]   Name: [name] DOB:1940/03/23 MRN: [id number]  Date:06/04/2011  Physician: Claudette Laws, MD    Nurse/CMA: Noralyn Pick, CMA  Allergies:  Allergies  Allergen Reactions  . Penicillins   . Sulfonamide Derivatives     Consent Signed: yes  Is patient diabetic? no    Pregnant: no LMP: No LMP recorded. Patient is not currently having periods (Reason: Other). (age 72-55)  Anticoagulants: no Anti-inflammatory: No Antibiotics: no  Procedure: SI Injection  Position: Prone Start Time: 11:30am  End Time: 11:32am  Fluoro Time: 7 seconds  RN/CMA Noralyn Pick, CMA Carroll, CMA    Time 11:18am 11:34am    BP 111/59 127/77    Pulse 71 80    Respirations 16 16    O2 Sat 98 98    S/S 6 6    Pain Level 5/10 0/10     D/C home with Quincy Sheehan, patient A & O X 3, D/C instructions reviewed, and sits independently.

## 2011-06-11 ENCOUNTER — Encounter: Payer: Self-pay | Admitting: Physical Medicine & Rehabilitation

## 2011-06-26 ENCOUNTER — Encounter: Payer: Self-pay | Admitting: Physical Medicine & Rehabilitation

## 2011-07-05 ENCOUNTER — Ambulatory Visit: Payer: 59 | Admitting: Physical Medicine & Rehabilitation

## 2011-07-09 ENCOUNTER — Ambulatory Visit (HOSPITAL_BASED_OUTPATIENT_CLINIC_OR_DEPARTMENT_OTHER): Payer: 59 | Admitting: Physical Medicine & Rehabilitation

## 2011-07-09 ENCOUNTER — Encounter: Payer: Self-pay | Admitting: Physical Medicine & Rehabilitation

## 2011-07-09 ENCOUNTER — Encounter: Payer: 59 | Attending: Physical Medicine & Rehabilitation

## 2011-07-09 VITALS — BP 130/70 | HR 67 | Resp 16 | Ht 62.0 in | Wt 167.6 lb

## 2011-07-09 DIAGNOSIS — M47816 Spondylosis without myelopathy or radiculopathy, lumbar region: Secondary | ICD-10-CM

## 2011-07-09 DIAGNOSIS — G8929 Other chronic pain: Secondary | ICD-10-CM | POA: Insufficient documentation

## 2011-07-09 DIAGNOSIS — M545 Low back pain, unspecified: Secondary | ICD-10-CM | POA: Insufficient documentation

## 2011-07-09 DIAGNOSIS — M25559 Pain in unspecified hip: Secondary | ICD-10-CM | POA: Insufficient documentation

## 2011-07-09 DIAGNOSIS — M47817 Spondylosis without myelopathy or radiculopathy, lumbosacral region: Secondary | ICD-10-CM

## 2011-07-09 DIAGNOSIS — M48061 Spinal stenosis, lumbar region without neurogenic claudication: Secondary | ICD-10-CM | POA: Insufficient documentation

## 2011-07-09 NOTE — Patient Instructions (Addendum)
Facet Block A facet block is an injection procedure used to numb nerves near a spinal joint (facet). The injection usually includes a medicine like Novacaine (anesthetic) and a steroid medicine (similar to cortisone). The injections are made directly into the facet joint of the back. They are used for patients with several types of neck or back pain problems (such as worsening arthritis or persistent pain after surgery) that have not been helped with anti-inflammatory medications, exercise programs, physical therapy, and other forms of pain management. Multiple injections may be needed depending on how many joints are involved.  A facet block procedure can be helpful with diagnosis as well as providing therapeutic pain relief. One of three things may happen after the procedure:  The pain does not go away. This can mean that the pain is probably not coming from blocked facet joints. This information is helpful with diagnosis.   The pain goes away and stays away for a few hours but the original pain comes back and does not get better again. This information is also helpful with diagnosis. It can mean that pain is probably coming from the joints; but the steroid was not helpful for longer term pain control.   The pain goes away after the block, then returns later that day, and then gets better again over the next few days. This can mean that the block was helpful for pain control and the steroid had a longer lasting effect.  If there is good, lasting benefit from the injections, the block may be repeated from 3 to 5 times. If there is good relief but it is only of short-term benefit, other procedures (such as radiofrequency lesioning) may be considered.  Note: The procedure cannot be performed if you have an active infection, a lesion on or near the area of injection, flu, cold, fever, very high blood pressure or if you are on blood thinners. Please make your doctor aware of any of these conditions. This is  for your safety!  LET YOUR CAREGIVER KNOW ABOUT:   Allergies.   Medications taken including herbs, eye drops, over the counter medications, and creams   Use of steroids (by mouth or creams).   Possible pregnancy, if applicable.   Previous problems with anesthetics or Novocaine.   History of blood clots.   History of bleeding or blood problems.   Previous surgery, particularly of the neck and/or back   Other health problems.  RISKS AND COMPLICATIONS These are very uncommon but include:  Bleeding.   Injury to a nerve near the injection site.   Weakness or numbness in areas controlled by nerves near the injection site.   Infection.   Pain at the site of the injection.   Temporary fluid retention in those who are prone to this problem.   Allergic reaction to anesthetics or medicines used during the procedure.  Diabetics may have a temporary increase in their blood sugar after any surgical procedure, especially if steroids are used. Stinging/burning of the numbing medicine is the most uncomfortable part of the procedure; however every person's response to any procedure is individual.  BEFORE THE PROCEDURE   Your caregiver will provide instructions about stopping any medication before the procedure.   Unless advised otherwise, if the injections are in your neck, you may take your medications as usual with a sip of water but do not eat or drink for 6 hours before the procedure.   Unless advised otherwise, you may eat, drink and take your medications as   usual on the day of the procedure (both before and after) if the injections are to be in your lower back.   There is no other specific preparation necessary unless advised otherwise.  PROCEDURE After checking your blood pressure, the procedure will be done in the x-ray (fluoroscopy) room while lying on your stomach. For procedures in the neck, an intravenous line is usually started. The back is then cleansed with an antiseptic  soap. Sterile drapes are placed in this area. The skin is numbed with a local anesthetic. This is felt as a stinging or burning sensation. Using x-ray guidance, needles are then advanced to the appropriate locations. Once the needles are in the proper location, the anesthetic and steroid is injected through the needles and the needles are removed. The skin is then cleansed and bandages are applied. Blood pressure will be checked again, and you will be discharged to leave with your ride after your caregiver says it is okay to go.  AFTER THE PROCEDURE  You may not drive for the remainder of the day after your procedure. An adult must be present to drive you home or to go with you in a taxi or on public transportation. The procedure will be canceled if you do not have a responsible adult with you! This is for your safety.  HOME CARE INSTRUCTIONS   The bandages noted above can be removed on the morning after the procedure.   Resume medications according to your caregiver's instructions.   No heat is to be used near or over the injected area(s) for the remainder of the day.   No tub bath or soaking in water (such as a pool, jacuzzi, etc.) for the remainder of the day.   Some local tenderness may be experienced for a couple of days after the injection. Using an ice pack three or four times a day will help this.   Keep track of the amount of pain relief as well as how long the pain relief lasted.  SEEK MEDICAL CARE IF:   There is drainage from the injection site.   Pain is not controlled with medications prescribed.   There is significant bleeding or swelling.  SEEK IMMEDIATE MEDICAL CARE IF:   You develop a fever of 101 F (38.3 C) or greater.   Worsening pain, swelling, and/or red streaking develops in the skin around the injection site.   Severe pain develops and cannot be controlled with medications prescribed.   You develop any headache, stiff neck, nausea, vomiting, or your eyes become  very sensitive to light.   Weakness or paralysis develops in arms or legs not present before the procedure.   You develop difficulty urinating or difficulty breathing.  Document Released: 07/25/2006 Document Revised: 02/22/2011 Document Reviewed: 07/15/2008 [employer] Patient Information 2012 [employer]. 

## 2011-07-09 NOTE — Progress Notes (Signed)

## 2011-07-09 NOTE — Progress Notes (Signed)
  PROCEDURE RECORD [facility]   Name: [name] DOB:[birth year] MRN: [id number]  Date:07/09/2011  Physician: Claudette Laws, MD    Nurse/CMA: Shanaye Rief RN  Allergies:  Allergies  Allergen Reactions  . Penicillins   . Sulfonamide Derivatives     Consent Signed: yes  Is patient diabetic? no  CBG today? n/a  Pregnant: no LMP: No LMP recorded. Patient is not currently having periods (Reason: Other). (age 72-55)  Anticoagulants: no Anti-inflammatory: no Antibiotics: no  Procedure: Bilateral Medial Branch Blocks L3-4-5 Position: Prone Start Time: 11:37 End Time: 11:45      Fluoro Time: 23 sec  RN/CMA Levens CMA Levens    Time 11:19 11:51     BP 130/70 153/78    Pulse 67 62    Respirations 16 16    O2 Sat 98% 98%    S/S 6 6    Pain Level 8   0     D/C home with husband, patient A & O X 3, D/C instructions reviewed, and sits independently.

## 2011-08-07 ENCOUNTER — Ambulatory Visit: Payer: 59 | Admitting: Physical Medicine & Rehabilitation

## 2011-08-14 ENCOUNTER — Ambulatory Visit (HOSPITAL_BASED_OUTPATIENT_CLINIC_OR_DEPARTMENT_OTHER): Payer: 59 | Admitting: Physical Medicine & Rehabilitation

## 2011-08-14 ENCOUNTER — Encounter: Payer: 59 | Attending: Physical Medicine & Rehabilitation

## 2011-08-14 ENCOUNTER — Encounter: Payer: Self-pay | Admitting: Physical Medicine & Rehabilitation

## 2011-08-14 VITALS — BP 111/61 | HR 81 | Resp 16 | Ht 62.0 in | Wt 168.0 lb

## 2011-08-14 DIAGNOSIS — G8929 Other chronic pain: Secondary | ICD-10-CM | POA: Insufficient documentation

## 2011-08-14 DIAGNOSIS — M545 Low back pain, unspecified: Secondary | ICD-10-CM | POA: Insufficient documentation

## 2011-08-14 DIAGNOSIS — M47817 Spondylosis without myelopathy or radiculopathy, lumbosacral region: Secondary | ICD-10-CM

## 2011-08-14 DIAGNOSIS — M25559 Pain in unspecified hip: Secondary | ICD-10-CM | POA: Insufficient documentation

## 2011-08-14 DIAGNOSIS — M47816 Spondylosis without myelopathy or radiculopathy, lumbar region: Secondary | ICD-10-CM

## 2011-08-14 DIAGNOSIS — M48061 Spinal stenosis, lumbar region without neurogenic claudication: Secondary | ICD-10-CM

## 2011-08-14 MED ORDER — GABAPENTIN 300 MG PO CAPS: 300.0000 mg | ORAL_CAPSULE | Freq: Every day | ORAL | Status: DC

## 2011-08-14 NOTE — Progress Notes (Signed)
Subjective:    Patient ID: [name], female    DOB: Oct 22, 1940, 71 y.o.   MRN: [id number]  HPI  Back is much better than prior to injection in April. Daytime pain only when doing dishes. Nighttime pain when she turns side to side. She does have left thigh pain in the posterior aspect. No leg weakness. Pain Inventory Average Pain 4 Pain Right Now 0 My pain is aching  In the last 24 hours, has pain interfered with the following? General activity 4 Relation with others 0 Enjoyment of life 0 What TIME of day is your pain at its worst? Evening and Nigh Sleep (in general) Fair  Pain is worse with: bending Pain improves with: rest Relief from Meds: 0  Mobility walk without assistance ability to climb steps?  yes do you drive?  yes  Function employed # of hrs/week 40  Neuro/Psych No problems in this area  Prior Studies Any changes since last visit?  no  Physicians involved in your care Any changes since last visit?  no   Family History  Problem Relation Age of Onset  . Heart disease Father   . Hypertension Father   . Diabetes Mother   . Hypertension Mother    History   Social History  . Marital Status: Married    Spouse Name: N/A    Number of Children: N/A  . Years of Education: N/A   Social History Main Topics  . Smoking status: Never Smoker   . Smokeless tobacco: Never Used   Comment: does not smoke  . Alcohol Use: Yes     social   . Drug Use: No  . Sexually Active: None   Other Topics Concern  . 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 13 yr education    Past Surgical History  Procedure Date  . Vein ligation and stripping   . Appendectomy   . Cholecystectomy   . Abdominal hysterectomy     w/o oophorectomy  . Bunionectomy     bilateral  . Tonsillectomy    Past Medical History  Diagnosis Date  . Paroxysmal atrial fibrillation     normal echiocardiogram and stress nuclear; low TSH with normal T3  and T4   . HTN (hypertension)   . 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 Low TSH level 01/2010  . Neuromuscular disorder    BP 111/61  Pulse 81  Resp 16  Ht 5\' 2"  (1.575 m)  Wt 168 lb (76.204 kg)  BMI 30.73 kg/m2  SpO2 97%      Review of Systems  Constitutional: Negative.   HENT: Negative.   Eyes: Negative.   Respiratory: Negative.   Cardiovascular: Negative.   Gastrointestinal: Negative.   Genitourinary: Negative.   Musculoskeletal: Negative.   Skin: Negative.   Neurological: Negative.   Hematological: Negative.   Psychiatric/Behavioral: Negative.        Objective:   Physical Exam  Constitutional: She is oriented to person, place, and time. She appears well-developed and well-nourished.  Musculoskeletal: She exhibits no edema and no tenderness.       Lumbar back: She exhibits pain. She exhibits normal range of motion and no tenderness.       Pain with leaning toward the left  Neurological: She is alert and oriented to person, place, and time. She displays normal reflexes. She exhibits normal muscle tone. Coordination normal.  Psychiatric: She has a normal mood and affect.          Assessment & Plan:  #1. Lumbar spondylosis improved after medial branch blocks.  2. Left lower extremity sciatic discomfort. 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 Will start gabapentin at night only. If this is not helpful consider epidural injections Return to clinic one month

## 2011-08-14 NOTE — Patient Instructions (Signed)
Please call if back pain increases again because this may be a sign that the injection is worn off We will try gabapentin at night for nerve pain.

## 2011-09-14 ENCOUNTER — Encounter: Payer: Self-pay | Admitting: Physical Medicine & Rehabilitation

## 2011-09-14 ENCOUNTER — Encounter: Payer: 59 | Attending: Physical Medicine & Rehabilitation

## 2011-09-14 ENCOUNTER — Ambulatory Visit (HOSPITAL_BASED_OUTPATIENT_CLINIC_OR_DEPARTMENT_OTHER): Payer: 59 | Admitting: Physical Medicine & Rehabilitation

## 2011-09-14 VITALS — BP 140/112 | HR 79 | Resp 16 | Ht 62.0 in | Wt 166.0 lb

## 2011-09-14 DIAGNOSIS — M48061 Spinal stenosis, lumbar region without neurogenic claudication: Secondary | ICD-10-CM | POA: Insufficient documentation

## 2011-09-14 DIAGNOSIS — M545 Low back pain, unspecified: Secondary | ICD-10-CM | POA: Insufficient documentation

## 2011-09-14 DIAGNOSIS — M47816 Spondylosis without myelopathy or radiculopathy, lumbar region: Secondary | ICD-10-CM

## 2011-09-14 DIAGNOSIS — M25559 Pain in unspecified hip: Secondary | ICD-10-CM | POA: Insufficient documentation

## 2011-09-14 DIAGNOSIS — M47817 Spondylosis without myelopathy or radiculopathy, lumbosacral region: Secondary | ICD-10-CM

## 2011-09-14 DIAGNOSIS — G8929 Other chronic pain: Secondary | ICD-10-CM | POA: Insufficient documentation

## 2011-09-14 NOTE — Progress Notes (Signed)
Subjective:    Patient ID: [name], female    DOB: [birth year], 71 y.o.   MRN: [id number]  HPI  Overall doing very well after medial branch blocks 2 months ago. Oswestry score is down to 12 Fully functional. 1 he can get back into more exercises. Discussed stationary bicycling Pain Inventory Average Pain 5 Pain Right Now 2 My pain is tingling and aching  In the last 24 hours, has pain interfered with the following? General activity 4 Relation with others 0 Enjoyment of life 4 What TIME of day is your pain at its worst? Evening and Night Sleep (in general) Fair  Pain is worse with: standing Pain improves with: rest Relief from Meds: N/A  Mobility walk without assistance ability to climb steps?  yes do you drive?  yes  Function employed # of hrs/week 40  Neuro/Psych No problems in this area  Prior Studies Any changes since last visit?  no  Physicians involved in your care Any changes since last visit?  no   Family History  Problem Relation Age of Onset  . Heart disease Father   . Hypertension Father   . Diabetes Mother   . Hypertension Mother    History   Social History  . Marital Status: Married    Spouse Name: N/A    Number of Children: N/A  . Years of Education: N/A   Social History Main Topics  . Smoking status: Never Smoker   . Smokeless tobacco: Never Used   Comment: does not smoke  . Alcohol Use: Yes     social   . Drug Use: No  . Sexually Active: None   Other Topics Concern  . 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 13 yr education    Past Surgical History  Procedure Date  . Vein ligation and stripping   . Appendectomy   . Cholecystectomy   . Abdominal hysterectomy     w/o oophorectomy  . Bunionectomy     bilateral  . Tonsillectomy    Past Medical History  Diagnosis Date  . Paroxysmal atrial fibrillation     normal echiocardiogram and stress nuclear; low TSH with normal T3 and T4    . HTN (hypertension)   . 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 Low TSH level 01/2010  . Neuromuscular disorder    BP 140/112  Pulse 79  Resp 16  Ht 5\' 2"  (1.575 m)  Wt 166 lb (75.297 kg)  BMI 30.36 kg/m2  SpO2 100%      Review of Systems  Constitutional: Negative.   HENT: Negative.   Eyes: Negative.   Respiratory: Negative.   Cardiovascular: Negative.   Gastrointestinal: Negative.   Genitourinary: Negative.   Musculoskeletal: Positive for back pain.  Skin: Negative.   Neurological: Negative.   Hematological: Negative.   Psychiatric/Behavioral: Negative.        Objective:   Physical Exam  Constitutional: She is oriented to person, place, and time. She appears well-developed and well-nourished.  HENT:  Head: Normocephalic and atraumatic.  Musculoskeletal:       Lumbar back: She exhibits normal range of motion, no tenderness, no swelling, no deformity and no pain.  Neurological: She is alert and oriented to person, place, and time. She has normal strength. Coordination and gait normal.  Psychiatric: She has a normal mood and affect.  Assessment & Plan:  #1. Lumbar spondylosis improved after medial branch blocks.  2. Left lower extremity sciatic discomfort. 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 Will start gabapentin at night only. 3. Return to clinic prn for repeat MBB

## 2011-09-14 NOTE — Patient Instructions (Signed)
Back Exercises These exercises may help you when beginning to rehabilitate your injury. Your symptoms may resolve with or without further involvement from your physician, physical therapist or athletic trainer. While completing these exercises, remember:   Restoring tissue flexibility helps normal motion to return to the joints. This allows healthier, less painful movement and activity.   An effective stretch should be held for at least 30 seconds.   A stretch should never be painful. You should only feel a gentle lengthening or release in the stretched tissue.  STRETCH - Extension, Prone on Elbows   Lie on your stomach on the floor, a bed will be too soft. Place your palms about shoulder width apart and at the height of your head.   Place your elbows under your shoulders. If this is too painful, stack pillows under your chest.   Allow your body to relax so that your hips drop lower and make contact more completely with the floor.   Hold this position for __________ seconds.   Slowly return to lying flat on the floor.  Repeat __________ times. Complete this exercise __________ times per day.  RANGE OF MOTION - Extension, Prone Press Ups   Lie on your stomach on the floor, a bed will be too soft. Place your palms about shoulder width apart and at the height of your head.   Keeping your back as relaxed as possible, slowly straighten your elbows while keeping your hips on the floor. You may adjust the placement of your hands to maximize your comfort. As you gain motion, your hands will come more underneath your shoulders.   Hold this position __________ seconds.   Slowly return to lying flat on the floor.  Repeat __________ times. Complete this exercise __________ times per day.  RANGE OF MOTION- Quadruped, Neutral Spine   Assume a hands and knees position on a firm surface. Keep your hands under your shoulders and your knees under your hips. You may place padding under your knees for  comfort.   Drop your head and point your tail bone toward the ground below you. This will round out your low back like an angry cat. Hold this position for __________ seconds.   Slowly lift your head and release your tail bone so that your back sags into a large arch, like an old horse.   Hold this position for __________ seconds.   Repeat this until you feel limber in your low back.   Now, find your "sweet spot." This will be the most comfortable position somewhere between the two previous positions. This is your neutral spine. Once you have found this position, tense your stomach muscles to support your low back.   Hold this position for __________ seconds.  Repeat __________ times. Complete this exercise __________ times per day.  STRETCH - Flexion, Single Knee to Chest   Lie on a firm bed or floor with both legs extended in front of you.   Keeping one leg in contact with the floor, bring your opposite knee to your chest. Hold your leg in place by either grabbing behind your thigh or at your knee.   Pull until you feel a gentle stretch in your low back. Hold __________ seconds.   Slowly release your grasp and repeat the exercise with the opposite side.  Repeat __________ times. Complete this exercise __________ times per day.  STRETCH - Hamstrings, Standing  Stand or sit and extend your right / left leg, placing your foot on a chair  or foot stool   Keeping a slight arch in your low back and your hips straight forward.   Lead with your chest and lean forward at the waist until you feel a gentle stretch in the back of your right / left knee or thigh. (When done correctly, this exercise requires leaning only a small distance.)   Hold this position for __________ seconds.  Repeat __________ times. Complete this stretch __________ times per day. STRENGTHENING - Deep Abdominals, Pelvic Tilt   Lie on a firm bed or floor. Keeping your legs in front of you, bend your knees so they are  both pointed toward the ceiling and your feet are flat on the floor.   Tense your lower abdominal muscles to press your low back into the floor. This motion will rotate your pelvis so that your tail bone is scooping upwards rather than pointing at your feet or into the floor.   With a gentle tension and even breathing, hold this position for __________ seconds.  Repeat __________ times. Complete this exercise __________ times per day.  STRENGTHENING - Abdominals, Crunches   Lie on a firm bed or floor. Keeping your legs in front of you, bend your knees so they are both pointed toward the ceiling and your feet are flat on the floor. Cross your arms over your chest.   Slightly tip your chin down without bending your neck.   Tense your abdominals and slowly lift your trunk high enough to just clear your shoulder blades. Lifting higher can put excessive stress on the low back and does not further strengthen your abdominal muscles.   Control your return to the starting position.  Repeat __________ times. Complete this exercise __________ times per day.  STRENGTHENING - Quadruped, Opposite UE/LE Lift   Assume a hands and knees position on a firm surface. Keep your hands under your shoulders and your knees under your hips. You may place padding under your knees for comfort.   Find your neutral spine and gently tense your abdominal muscles so that you can maintain this position. Your shoulders and hips should form a rectangle that is parallel with the floor and is not twisted.   Keeping your trunk steady, lift your right hand no higher than your shoulder and then your left leg no higher than your hip. Make sure you are not holding your breath. Hold this position __________ seconds.   Continuing to keep your abdominal muscles tense and your back steady, slowly return to your starting position. Repeat with the opposite arm and leg.  Repeat __________ times. Complete this exercise __________ times per  day. Document Released: 03/23/2005 Document Revised: 02/22/2011 Document Reviewed: 06/17/2008 [facility] Patient Information 2012 [place], [place].

## 2011-10-18 ENCOUNTER — Ambulatory Visit (INDEPENDENT_AMBULATORY_CARE_PROVIDER_SITE_OTHER): Payer: 59 | Admitting: Otolaryngology

## 2011-10-18 DIAGNOSIS — R490 Dysphonia: Secondary | ICD-10-CM

## 2011-10-18 DIAGNOSIS — K121 Other forms of stomatitis: Secondary | ICD-10-CM

## 2011-10-18 DIAGNOSIS — K1230 Oral mucositis (ulcerative), unspecified: Secondary | ICD-10-CM

## 2011-12-05 ENCOUNTER — Encounter (INDEPENDENT_AMBULATORY_CARE_PROVIDER_SITE_OTHER): Payer: Self-pay | Admitting: *Deleted

## 2011-12-05 ENCOUNTER — Ambulatory Visit (INDEPENDENT_AMBULATORY_CARE_PROVIDER_SITE_OTHER): Payer: 59 | Admitting: Internal Medicine

## 2011-12-05 ENCOUNTER — Other Ambulatory Visit (INDEPENDENT_AMBULATORY_CARE_PROVIDER_SITE_OTHER): Payer: Self-pay | Admitting: *Deleted

## 2011-12-05 ENCOUNTER — Encounter (INDEPENDENT_AMBULATORY_CARE_PROVIDER_SITE_OTHER): Payer: Self-pay | Admitting: Internal Medicine

## 2011-12-05 VITALS — BP 114/84 | HR 72 | Temp 97.6°F | Ht 62.0 in | Wt 166.3 lb

## 2011-12-05 DIAGNOSIS — K219 Gastro-esophageal reflux disease without esophagitis: Secondary | ICD-10-CM

## 2011-12-05 MED ORDER — ESOMEPRAZOLE MAGNESIUM 40 MG PO CPDR: 40.0000 mg | DELAYED_RELEASE_CAPSULE | Freq: Every day | ORAL | Status: DC

## 2011-12-05 NOTE — Patient Instructions (Addendum)
EGD. The risks and benefits such as perforation, bleeding, and infection were reviewed with the patient and is agreeable. 

## 2011-12-05 NOTE — Progress Notes (Signed)
Subjective:     Patient ID: [name], female   DOB: 27-Oct-1940, 71 y.o.   MRN: [id number]  HPIPresents today with c/o burning in her esophagus. Saw Dr. Suszanne Conners for ? Burning in her esophagus.  Laryngoscopy was normal.  She says her nose runs all the time.   Acid reflux came up into her mouth a couple of night ago. She has a hx of acid reflux in the past.  She watches she eats especially at night to avoid acid reflux. Taking Omeprazole 20mg  twice a day. Her esophagus stings at night. Appetite is good. No weight loss. No abdominal pain.  Usually has a BM about once a day. No bright red rectal bleeding melena.   Colonoscopy in 2006: FINAL DIAGNOSIS:  1. Small sigmoid polyp which was ablated via cold biopsy.  2. Prominent anal papilla. Unless she has problems or it protrudes out  often, she will not need any intervention or banding.  Review of Systems see hpi Current Outpatient Prescriptions  Medication Sig Dispense Refill  . aspirin 81 MG EC tablet Take 81 mg by mouth daily.        . Biotin 1 MG CAPS Take by mouth.      . Calcium-Vitamin D 600-125 MG-UNIT TABS Take 1 tablet by mouth daily.        Marland Kitchen lisinopril-hydrochlorothiazide (PRINZIDE,ZESTORETIC) 20-25 MG per tablet Take 1 tablet by mouth daily.        Marland Kitchen omeprazole (PRILOSEC) 20 MG capsule Take 20 mg by mouth 2 (two) times daily before a meal.      . gabapentin (NEURONTIN) 300 MG capsule Take 1 capsule (300 mg total) by mouth at bedtime.  30 capsule  1  . metoprolol tartrate (LOPRESSOR) 25 MG tablet Take 25 mg by mouth 2 (two) times daily.        . Multiple Vitamins-Minerals (CENTRUM SILVER PO) Take by mouth daily.        . Multiple Vitamins-Minerals (HAIR/SKIN/NAILS PO) Take 1 tablet by mouth daily.        . naproxen (NAPROSYN) 375 MG tablet Take 1 tablet (375 mg total) by mouth 2 (two) times daily with a meal.  60 tablet  3  . zinc gluconate 50 MG tablet Take 50 mg by mouth daily.              Objective:   Physical  Exam Filed Vitals:   12/05/11 1040  BP: 114/84  Pulse: 72  Temp: 97.6 F (36.4 C)  Height: 5\' 2"  (1.575 m)  Weight: 166 lb 4.8 oz (75.433 kg)   Alert and oriented. Skin warm and dry. Oral mucosa is moist.   . Sclera anicteric, conjunctivae is pink. Thyroid not enlarged. No cervical lymphadenopathy. Lungs clear. Heart regular rate and rhythm.  Abdomen is soft. Bowel sounds are positive. No hepatomegaly. No abdominal masses felt. No tenderness.  No edema to lower extremities       Assessment:    GERD, uncontrolled at this time.  She is presently taking Omeprazole 20mg   BID without relief of her symptoms.     Plan:    EGD. The risks and benefits such as perforation, bleeding, and infection were reviewed with the patient and is agreeable.    Aciphex samples given to patient.

## 2011-12-05 NOTE — Telephone Encounter (Signed)
This encounter was created in error - please disregard.

## 2011-12-11 ENCOUNTER — Other Ambulatory Visit (HOSPITAL_COMMUNITY): Payer: Self-pay | Admitting: Family Medicine

## 2011-12-11 DIAGNOSIS — Z139 Encounter for screening, unspecified: Secondary | ICD-10-CM

## 2011-12-12 ENCOUNTER — Other Ambulatory Visit (INDEPENDENT_AMBULATORY_CARE_PROVIDER_SITE_OTHER): Payer: Self-pay | Admitting: Internal Medicine

## 2011-12-12 NOTE — Progress Notes (Signed)
E PRSCRIBED TO [facility]

## 2011-12-24 ENCOUNTER — Encounter (HOSPITAL_COMMUNITY): Payer: Self-pay | Admitting: Pharmacy Technician

## 2011-12-26 MED ORDER — SODIUM CHLORIDE 0.45 % IV SOLN
INTRAVENOUS | Status: DC
  Administered 2011-12-27: 11:00:00 via INTRAVENOUS

## 2011-12-27 ENCOUNTER — Encounter (HOSPITAL_COMMUNITY): Payer: Self-pay | Admitting: *Deleted

## 2011-12-27 ENCOUNTER — Ambulatory Visit (HOSPITAL_COMMUNITY)
Admission: RE | Admit: 2011-12-27 | Discharge: 2011-12-27 | Disposition: A | Discharge status: Home / Self Care | Payer: 59 | Source: Ambulatory Visit | Attending: Internal Medicine | Admitting: Internal Medicine

## 2011-12-27 ENCOUNTER — Ambulatory Visit (HOSPITAL_COMMUNITY): Payer: 59

## 2011-12-27 ENCOUNTER — Encounter (HOSPITAL_COMMUNITY)
Admission: RE | Disposition: A | Discharge status: Home / Self Care | Payer: Self-pay | Source: Ambulatory Visit | Attending: Internal Medicine

## 2011-12-27 DIAGNOSIS — K219 Gastro-esophageal reflux disease without esophagitis: Secondary | ICD-10-CM | POA: Insufficient documentation

## 2011-12-27 DIAGNOSIS — K449 Diaphragmatic hernia without obstruction or gangrene: Secondary | ICD-10-CM

## 2011-12-27 DIAGNOSIS — I10 Essential (primary) hypertension: Secondary | ICD-10-CM | POA: Insufficient documentation

## 2011-12-27 HISTORY — PX: ESOPHAGOGASTRODUODENOSCOPY: SHX5428

## 2011-12-27 SURGERY — EGD (ESOPHAGOGASTRODUODENOSCOPY)
Anesthesia: Moderate Sedation

## 2011-12-27 MED ORDER — MEPERIDINE HCL 50 MG/ML IJ SOLN
INTRAMUSCULAR | Status: AC
  Filled 2011-12-27: qty 1

## 2011-12-27 MED ORDER — STERILE WATER FOR IRRIGATION IR SOLN
Status: DC | PRN
  Administered 2011-12-27: 12:00:00

## 2011-12-27 MED ORDER — MIDAZOLAM HCL 5 MG/5ML IJ SOLN
INTRAMUSCULAR | Status: DC | PRN
  Administered 2011-12-27: 1 mg via INTRAVENOUS
  Administered 2011-12-27 (×2): 2 mg via INTRAVENOUS

## 2011-12-27 MED ORDER — MEPERIDINE HCL 25 MG/ML IJ SOLN
INTRAMUSCULAR | Status: DC | PRN
  Administered 2011-12-27 (×2): 25 mg via INTRAVENOUS

## 2011-12-27 MED ORDER — MIDAZOLAM HCL 5 MG/5ML IJ SOLN
INTRAMUSCULAR | Status: AC
  Filled 2011-12-27: qty 10

## 2011-12-27 NOTE — Op Note (Signed)
EGD PROCEDURE REPORT  PATIENT:  [name]  MR#:  [id number] Birthdate:  [birth year], 71 y.o., female Endoscopist:  Dr. Malissa Hippo, MD Referred By:  Dr. Janeece Riggers Philomena Doheny, MD  Procedure Date: 12/27/2011  Procedure:   EGD.  Indications:  Patient is 71 year old Caucasian female with recent onset of GERD with atypical symptoms. She was initially evaluated by Dr. Suszanne Conners for burning in her throat but did not respond to omeprazole 20 twice a day but doing better on Esomeprazole 40 mg every morning. She is undergoing diagnostic EGD.            Informed Consent:  The risks, benefits, alternatives & imponderables which include, but are not limited to, bleeding, infection, perforation, drug reaction and potential missed lesion have been reviewed.  The potential for biopsy, lesion removal, esophageal dilation, etc. have also been discussed.  Questions have been answered.  All parties agreeable.  Please see history & physical in medical record for more information.  Medications:  Demerol 50 mg IV Versed 5 mg IV Cetacaine spray topically for oropharyngeal anesthesia  Description of procedure:  The endoscope was introduced through the mouth and advanced to the second portion of the duodenum without difficulty or limitations. The mucosal surfaces were surveyed very carefully during advancement of the scope and upon withdrawal.  Findings:  Esophagus:  Mucosa of the esophagus was normal. GE junction unremarkable. GEJ:  40 cm Hiatus:  42 cm Stomach:  Other than small amount of bile stomach was empty and distended very well with insufflation. Folds in the proximal stomach were normal. Examination mucosa at body, antrum, pyloric channel, fundus and cardia was normal. Duodenum:  Normal bulbar and post bulbar mucosa.  Therapeutic/Diagnostic Maneuvers Performed:  None   Complications:  None  Impression: Small sliding hiatal hernia otherwise normal EGD.  Recommendations:  Antireflux measures  reinforced. Continue Esomeprazole at 40 mg by mouth every morning. Can take OTC Zantac or Pepcid Mondays and she is going to eat late in the evening. Office visit in 6 months.  REHMAN,NAJEEB U  12/27/2011  11:53 AM  CC: Dr. Louie Boston, MD & Dr. Bonnetta Barry ref. provider found         Dr. Karn Pickler, MD

## 2011-12-27 NOTE — H&P (Signed)
[name] is an 71 y.o. female.   Chief Complaint: Patient is in for esophagogastroduodenoscopy. HPI: Patient is 71 year old Caucasian female presents with atypical symptoms of GERD. She's been experiencing burning in her throat. She was seen by Dr. Suszanne Conners but did not have good symptom relief with omeprazole 20 mg twice a day. He is doing better with Nexium. He denies dysphagia. She rarely experiences heartburn. She denies abdominal pain or melena.  Past Medical History  Diagnosis Date  . Paroxysmal atrial fibrillation     normal echiocardiogram and stress nuclear; low TSH with normal T3 and T4   . HTN (hypertension)   . 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 Low TSH level 01/2010  . Neuromuscular disorder     Past Surgical History  Procedure Date  . Vein ligation and stripping   . Appendectomy   . Cholecystectomy   . Abdominal hysterectomy     w/o oophorectomy  . Bunionectomy     bilateral  . Tonsillectomy   . Tonsillectomy     Family History  Problem Relation Age of Onset  . Heart disease Father   . Hypertension Father   . Diabetes Mother   . Hypertension Mother    Social History:  reports that she has never smoked. She has never used smokeless tobacco. She reports that she drinks alcohol. She reports that she does not use illicit drugs.  Allergies:  Allergies  Allergen Reactions  . Penicillins Other (See Comments)    Unknown   . Sulfonamide Derivatives Other (See Comments)    Leg Pain    Medications Prior to Admission  Medication Sig Dispense Refill  . aspirin 81 MG EC tablet Take 81 mg by mouth daily.        . Biotin 1 MG CAPS Take by mouth.      . Calcium-Vitamin D 600-125 MG-UNIT TABS Take 1 tablet by mouth daily.        Marland Kitchen esomeprazole (NEXIUM) 40 MG capsule Take 1 capsule (40 mg total) by mouth daily.  30 capsule  5  . fish oil-omega-3 fatty acids 1000 MG capsule Take 2 g by mouth daily.      Marland Kitchen  lisinopril-hydrochlorothiazide (PRINZIDE,ZESTORETIC) 20-25 MG per tablet Take 1 tablet by mouth daily.      . metoprolol tartrate (LOPRESSOR) 25 MG tablet Take 25 mg by mouth 2 (two) times daily.       . Multiple Vitamins-Minerals (CENTRUM SILVER PO) Take 1 tablet by mouth daily.       . Multiple Vitamins-Minerals (HAIR/SKIN/NAILS PO) Take 1 tablet by mouth daily.      . Multiple Vitamins-Minerals (MACULAR VITAMIN BENEFIT) TABS Take 1 tablet by mouth daily.      Marland Kitchen zinc gluconate 50 MG tablet Take 50 mg by mouth daily.          No results found for this or any previous visit (from the past 48 hour(s)). No results found.  ROS  Blood pressure 165/86, pulse 77, temperature 97.6 F (36.4 C), temperature source Oral, resp. rate 18, height 5\' 2"  (1.575 m), weight 162 lb (73.483 kg), SpO2 98.00%. Physical Exam  Constitutional: She appears well-developed and well-nourished.  HENT:  Mouth/Throat: Oropharynx is clear and moist.  Eyes: Conjunctivae normal are normal. No scleral icterus.  Neck: No thyromegaly present.  Cardiovascular: Normal rate, regular rhythm and normal heart sounds.   No murmur heard. Respiratory: Effort normal.  GI:  Soft. She exhibits no distension and no mass. There is no tenderness.  Musculoskeletal: She exhibits no edema.  Lymphadenopathy:    She has no cervical adenopathy.  Neurological: She is alert.  Skin: Skin is warm and dry.     Assessment/Plan New onset of GERD with atypical symptoms. Diagnostic EGD.  Clemmie Marxen U 12/27/2011, 11:32 AM

## 2011-12-31 ENCOUNTER — Ambulatory Visit (HOSPITAL_COMMUNITY)
Admission: RE | Admit: 2011-12-31 | Discharge: 2011-12-31 | Disposition: A | Discharge status: Home / Self Care | Payer: 59 | Source: Ambulatory Visit | Attending: Family Medicine | Admitting: Family Medicine

## 2011-12-31 DIAGNOSIS — Z139 Encounter for screening, unspecified: Secondary | ICD-10-CM

## 2011-12-31 DIAGNOSIS — Z1231 Encounter for screening mammogram for malignant neoplasm of breast: Secondary | ICD-10-CM | POA: Insufficient documentation

## 2012-01-04 ENCOUNTER — Encounter (HOSPITAL_COMMUNITY): Payer: Self-pay | Admitting: Internal Medicine

## 2012-05-15 ENCOUNTER — Ambulatory Visit (HOSPITAL_BASED_OUTPATIENT_CLINIC_OR_DEPARTMENT_OTHER): Payer: 59 | Admitting: Physical Medicine & Rehabilitation

## 2012-05-15 ENCOUNTER — Encounter: Payer: Self-pay | Admitting: Physical Medicine & Rehabilitation

## 2012-05-15 ENCOUNTER — Encounter: Payer: 59 | Attending: Physical Medicine & Rehabilitation

## 2012-05-15 VITALS — BP 120/62 | HR 68 | Resp 14 | Ht 62.0 in | Wt 164.0 lb

## 2012-05-15 DIAGNOSIS — M47817 Spondylosis without myelopathy or radiculopathy, lumbosacral region: Secondary | ICD-10-CM

## 2012-05-15 DIAGNOSIS — M545 Low back pain, unspecified: Secondary | ICD-10-CM | POA: Insufficient documentation

## 2012-05-15 NOTE — Progress Notes (Signed)
Right lumbar L3, L4 medial branch blocks and L5 dorsal ramus injection under fluoroscopic guidance  Indication: Right Lumbar pain which is not relieved by medication management or other conservative care and interfering with self-care and mobility.  Informed consent was obtained after describing risks and benefits of the procedure with the patient, this includes bleeding, bruising, infection, paralysis and medication side effects. The patient wishes to proceed and has given written consent. The patient was placed in a prone position. The lumbar area was marked and prepped with Betadine. One ML of 1% lidocaine was injected into each of 3 areas into the skin and subcutaneous tissue. Then a 22-gauge 3.5" spinal needle was inserted targeting the junction of the Right S1 superior articular process and sacral ala junction. Needle was advanced under fluoroscopic guidance. Bone contact was made. Omnipaque 180 was injected x0.5 mL demonstrating no intravascular uptake. Then a solution containing one ML of 4 mg per mL dexamethasone and 3 mL of 2% MPF lidocaine was injected x0.5 mL. Then the Right L5 superior articular process in transverse process junction was targeted. Bone contact was made. Omnipaque 180 was injected x0.5 mL demonstrating no intravascular uptake. Then a solution containing one ML of 4 mg per mL dexamethasone and 3 mL of 2% MPF lidocaine was injected x0.5 mL. Then the Right L4 superior articular process in transverse process junction was targeted. Bone contact was made. Omnipaque 180 was injected x0.5 mL demonstrating no intravascular uptake. Then a solution containing one ML of 4 mg per mL dexamethasone and 3 mL of 2% MPF lidocaine was injected x0.5 mL Patient tolerated procedure well. Post procedure instructions were given. Please refer to post procedure form. 

## 2012-05-15 NOTE — Progress Notes (Deleted)

## 2012-05-15 NOTE — Patient Instructions (Signed)
Facet Block A facet block is an injection procedure used to numb nerves near a spinal joint (facet). The injection usually includes a medicine like Novacaine (anesthetic) and a steroid medicine (similar to cortisone). The injections are made directly into the facet joint of the back. They are used for patients with several types of neck or back pain problems (such as worsening arthritis or persistent pain after surgery) that have not been helped with anti-inflammatory medications, exercise programs, physical therapy, and other forms of pain management. Multiple injections may be needed depending on how many joints are involved.  A facet block procedure can be helpful with diagnosis as well as providing therapeutic pain relief. One of three things may happen after the procedure:  The pain does not go away. This can mean that the pain is probably not coming from blocked facet joints. This information is helpful with diagnosis.  The pain goes away and stays away for a few hours but the original pain comes back and does not get better again. This information is also helpful with diagnosis. It can mean that pain is probably coming from the joints; but the steroid was not helpful for longer term pain control.  The pain goes away after the block, then returns later that day, and then gets better again over the next few days. This can mean that the block was helpful for pain control and the steroid had a longer lasting effect. If there is good, lasting benefit from the injections, the block may be repeated from 3 to 5 times. If there is good relief but it is only of short-term benefit, other procedures (such as radiofrequency lesioning) may be considered.  Note: The procedure cannot be performed if you have an active infection, a lesion on or near the area of injection, flu, cold, fever, very high blood pressure or if you are on blood thinners. Please make your doctor aware of any of these conditions. This is for  your safety!  LET YOUR CAREGIVER KNOW ABOUT:   Allergies.  Medications taken including herbs, eye drops, over the counter medications, and creams  Use of steroids (by mouth or creams).  Possible pregnancy, if applicable.  Previous problems with anesthetics or Novocaine.  History of blood clots.  History of bleeding or blood problems.  Previous surgery, particularly of the neck and/or back  Other health problems. RISKS AND COMPLICATIONS These are very uncommon but include:  Bleeding.  Injury to a nerve near the injection site.  Weakness or numbness in areas controlled by nerves near the injection site.  Infection.  Pain at the site of the injection.  Temporary fluid retention in those who are prone to this problem.  Allergic reaction to anesthetics or medicines used during the procedure. Diabetics may have a temporary increase in their blood sugar after any surgical procedure, especially if steroids are used. Stinging/burning of the numbing medicine is the most uncomfortable part of the procedure; however every person's response to any procedure is individual.  BEFORE THE PROCEDURE   Your caregiver will provide instructions about stopping any medication before the procedure.  Unless advised otherwise, if the injections are in your neck, you may take your medications as usual with a sip of water but do not eat or drink for 6 hours before the procedure.  Unless advised otherwise, you may eat, drink and take your medications as usual on the day of the procedure (both before and after) if the injections are to be in your lower back.    There is no other specific preparation necessary unless advised otherwise. PROCEDURE After checking your blood pressure, the procedure will be done in the x-ray (fluoroscopy) room while lying on your stomach. For procedures in the neck, an intravenous line is usually started. The back is then cleansed with an antiseptic soap. Sterile drapes are  placed in this area. The skin is numbed with a local anesthetic. This is felt as a stinging or burning sensation. Using x-ray guidance, needles are then advanced to the appropriate locations. Once the needles are in the proper location, the anesthetic and steroid is injected through the needles and the needles are removed. The skin is then cleansed and bandages are applied. Blood pressure will be checked again, and you will be discharged to leave with your ride after your caregiver says it is okay to go.  AFTER THE PROCEDURE  You may not drive for the remainder of the day after your procedure. An adult must be present to drive you home or to go with you in a taxi or on public transportation. The procedure will be canceled if you do not have a responsible adult with you! This is for your safety.  HOME CARE INSTRUCTIONS   The bandages noted above can be removed on the morning after the procedure.  Resume medications according to your caregiver's instructions.  No heat is to be used near or over the injected area(s) for the remainder of the day.  No tub bath or soaking in water (such as a pool, jacuzzi, etc.) for the remainder of the day.  Some local tenderness may be experienced for a couple of days after the injection. Using an ice pack three or four times a day will help this.  Keep track of the amount of pain relief as well as how long the pain relief lasted. SEEK MEDICAL CARE IF:   There is drainage from the injection site.  Pain is not controlled with medications prescribed.  There is significant bleeding or swelling. SEEK IMMEDIATE MEDICAL CARE IF:   You develop a fever of 101 F (38.3 C) or greater.  Worsening pain, swelling, and/or red streaking develops in the skin around the injection site.  Severe pain develops and cannot be controlled with medications prescribed.  You develop any headache, stiff neck, nausea, vomiting, or your eyes become very sensitive to light.  Weakness  or paralysis develops in arms or legs not present before the procedure.  You develop difficulty urinating or difficulty breathing. Document Released: 07/25/2006 Document Revised: 05/28/2011 Document Reviewed: 07/15/2008 Exit[employer] Patient Information 2013 [employer].  

## 2012-05-15 NOTE — Progress Notes (Signed)
  PROCEDURE RECORD [facility]   Name: [name] DOB:[birth year] MRN: [id number]  Date:05/15/2012  Physician: Claudette Laws, MD    Nurse/CMA: Kelli Churn, CMA/Shumaker, RN  Allergies:  Allergies  Allergen Reactions  . Penicillins Other (See Comments)    Unknown   . Sulfonamide Derivatives Other (See Comments)    Leg Pain    Consent Signed: yes  Is patient diabetic? no   Pregnant: no LMP: No LMP recorded. Patient has had a hysterectomy. (age 26-55)  Anticoagulants: no Anti-inflammatory: no Antibiotics: no  Procedure: right medial branch block  Position: Prone Start Time: 1:38  End Time: 1:43  Fluoro Time: 14 seconds  RN/CMA Levens, CMA Shumaker, RN    Time 112 1:45    BP 120/62 147/77    Pulse 70 71    Respirations 14 14    O2 Sat 96 98    S/S 6 6    Pain Level 0/10 0/10     D/C home with Husband-Arthur, patient A & O X 3, D/C instructions reviewed, and sits independently.

## 2012-06-10 ENCOUNTER — Ambulatory Visit: Payer: 59 | Admitting: Physical Medicine & Rehabilitation

## 2012-06-18 ENCOUNTER — Telehealth (INDEPENDENT_AMBULATORY_CARE_PROVIDER_SITE_OTHER): Payer: Self-pay | Admitting: Internal Medicine

## 2012-06-18 MED ORDER — ESOMEPRAZOLE MAGNESIUM 40 MG PO CPDR: 40.0000 mg | DELAYED_RELEASE_CAPSULE | Freq: Every day | ORAL | Status: DC

## 2012-06-18 NOTE — Telephone Encounter (Signed)
Rx for Nexium eprescribed to her pharmacy

## 2012-06-30 ENCOUNTER — Ambulatory Visit (INDEPENDENT_AMBULATORY_CARE_PROVIDER_SITE_OTHER): Payer: 59 | Admitting: Internal Medicine

## 2012-06-30 ENCOUNTER — Encounter (INDEPENDENT_AMBULATORY_CARE_PROVIDER_SITE_OTHER): Payer: Self-pay | Admitting: Internal Medicine

## 2012-06-30 VITALS — BP 116/80 | HR 74 | Temp 97.6°F | Resp 16 | Ht 62.0 in | Wt 162.1 lb

## 2012-06-30 DIAGNOSIS — K219 Gastro-esophageal reflux disease without esophagitis: Secondary | ICD-10-CM

## 2012-06-30 DIAGNOSIS — K589 Irritable bowel syndrome without diarrhea: Secondary | ICD-10-CM

## 2012-06-30 NOTE — Patient Instructions (Signed)
Hemoccult x1. Stool and symptom diary for one month. Continue high fiber diet. Progress report in one month

## 2012-06-30 NOTE — Progress Notes (Signed)
Presenting complaint;  Abdominal cramps and had regular bowel movements. Followup for GERD.  Subjective:  Take a [name] who is here for scheduled visit. Following her last visit in September 2013 she underwent esophagogastroduodenoscopy and was found to have small sliding hiatal hernia. She was having typical and atypical symptoms unresponsive to omeprazole. She states she has had no problems since she has been on Nexium. She wonders if she could drop the dose at some point. Today she's also complaining of change in her bowel habits. She goes from having normal bowel movements to being constipated and then passing semi-formed or loose stools. When she is constipated she may go 3 days without a bowel movement but when she has diarrhea it generally is one large bowel movement. She also complains of lower abdominal cramps and urgency and at times urgency has not followed by defecation. She tried eating oatmeal in yogurt and she believes it made her diarrhea worse. She denies melena or frank rectal bleeding. She is seen blood on tissue when she passes hard stool. She does not experience abdominal cramps daily. She has good appetite she has lost 5 pounds since her last visit. She has not taken any antibiotics recently. Her last colonoscopy was in December 2006 with removal of small polyp from sigmoid colon which was non-adenomatous. Family history is negative for CRC. She is not sure what is the portal amount of calcium she takes each day.  Current Medications: Current Outpatient Prescriptions  Medication Sig Dispense Refill  . aspirin 81 MG EC tablet Take 81 mg by mouth daily.        . Calcium-Vitamin D 600-125 MG-UNIT TABS Take 1 tablet by mouth daily.        Marland Kitchen esomeprazole (NEXIUM) 40 MG capsule Take 1 capsule (40 mg total) by mouth daily.  30 capsule  11  . fish oil-omega-3 fatty acids 1000 MG capsule Take 2 g by mouth daily.      Marland Kitchen lisinopril-hydrochlorothiazide  (PRINZIDE,ZESTORETIC) 20-25 MG per tablet Take 1 tablet by mouth daily.      . metoprolol tartrate (LOPRESSOR) 25 MG tablet Take 25 mg by mouth 2 (two) times daily.       . Multiple Vitamins-Minerals (CENTRUM SILVER PO) Take 1 tablet by mouth daily.       . Multiple Vitamins-Minerals (HAIR/SKIN/NAILS PO) Take 1 tablet by mouth daily.      . Multiple Vitamins-Minerals (MACULAR VITAMIN BENEFIT) TABS Take 1 tablet by mouth daily.      Marland Kitchen zinc gluconate 50 MG tablet Take 50 mg by mouth daily.         No current facility-administered medications for this visit.     Objective: Blood pressure 116/80, pulse 74, temperature 97.6 F (36.4 C), temperature source Oral, resp. rate 16, height 5\' 2"  (1.575 m), weight 162 lb 1.3 oz (73.519 kg). Patient is alert and in no acute distress. Conjunctiva is pink. Sclera is nonicteric Oropharyngeal mucosa is normal. No neck masses or thyromegaly noted. Cardiac exam with regular rhythm normal S1 and S2. No murmur or gallop noted. Lungs are clear to auscultation. Abdomen is full. Bowel sounds are hyperactive. Abdomen is soft and nontender without organomegaly or masses. No LE edema or clubbing noted.  Assessment:  #1. GERD. Symptoms well controlled with therapy. We'll consider dropping the dose of Nexium to 20 mg a day in future. #2. Change in bowel habits with intermittent abdominal cramps. This symptom complex appears to be typical of IBS. Her symptom  complex may have been triggered because of constipation and may be related to calcium intake. If she does not improve with therapy will need colonoscopy now instead of in 2016.    Plan:  High fiber diet. Keep oral calcium intake to 1 or 1.2 g daily. Keep symptom and stool diary for one month and send [place] a summary for review. Call if symptoms progress. Office visit in 6 months.

## 2012-11-04 ENCOUNTER — Encounter (INDEPENDENT_AMBULATORY_CARE_PROVIDER_SITE_OTHER): Payer: Self-pay

## 2012-12-19 ENCOUNTER — Ambulatory Visit (INDEPENDENT_AMBULATORY_CARE_PROVIDER_SITE_OTHER): Payer: 59 | Admitting: Internal Medicine

## 2012-12-19 ENCOUNTER — Encounter (INDEPENDENT_AMBULATORY_CARE_PROVIDER_SITE_OTHER): Payer: Self-pay | Admitting: Internal Medicine

## 2012-12-19 VITALS — BP 118/74 | HR 76 | Temp 97.4°F | Resp 18 | Ht 62.0 in | Wt 164.1 lb

## 2012-12-19 DIAGNOSIS — K589 Irritable bowel syndrome without diarrhea: Secondary | ICD-10-CM

## 2012-12-19 MED ORDER — HYOSCYAMINE SULFATE 0.125 MG SL SUBL: 0.1250 mg | SUBLINGUAL_TABLET | Freq: Four times a day (QID) | SUBLINGUAL | Status: DC | PRN

## 2012-12-19 MED ORDER — ALIGN PO CAPS: 1.0000 | ORAL_CAPSULE | Freq: Every day | ORAL | Status: DC

## 2012-12-19 NOTE — Patient Instructions (Addendum)
Align or equivalent one capsule by mouth daily. Can stop after two months if no benefit noted. Hyoscyamine sublingual 1 tablet up to 4 times a day as needed

## 2012-12-19 NOTE — Progress Notes (Signed)
Presenting complaint;  Followup for irritable bowel syndrome.  Subjective:  [name] is 72 year old Caucasian female who presents for scheduled visit. She was last seen in April 2014. At that time she presented with irregular bowel movements. She would have diarrhea normal stools as well as constipation. She also experienced lower abdominal cramps. She was felt to have irritable bowel syndrome and advised to increase dietary fiber. Her stool was guaiac negative. She did send [place] a diary for one month and on most days she had normal stools but had no cramps. She states her symptoms have progressed and she is having intermittent lower abdominal cramps. Usually cramps may last for few minutes and generally relieved with passing flatus or stool. At work she has gotten more assignments and she believes stress is making her symptoms worse. She denies melena or rectal bleeding anorexia or weight loss. Her heartburn is well controlled with PPI.  Current Medications: Current Outpatient Prescriptions  Medication Sig Dispense Refill  . aspirin 81 MG EC tablet Take 81 mg by mouth daily.        . Calcium-Vitamin D 600-125 MG-UNIT TABS Take 1 tablet by mouth daily.        Marland Kitchen esomeprazole (NEXIUM) 40 MG capsule Take 1 capsule (40 mg total) by mouth daily.  30 capsule  11  . fish oil-omega-3 fatty acids 1000 MG capsule Take 2 g by mouth daily.      Marland Kitchen lisinopril-hydrochlorothiazide (PRINZIDE,ZESTORETIC) 20-25 MG per tablet Take 1 tablet by mouth daily.      . metoprolol tartrate (LOPRESSOR) 25 MG tablet Take 25 mg by mouth 2 (two) times daily.       . Multiple Vitamins-Minerals (CENTRUM SILVER PO) Take 1 tablet by mouth daily.       . Multiple Vitamins-Minerals (HAIR/SKIN/NAILS PO) Take 1 tablet by mouth daily.      Marland Kitchen zinc gluconate 50 MG tablet Take 50 mg by mouth daily.         No current facility-administered medications for this visit.     Objective: Blood pressure 118/74, pulse 76, temperature 97.4 F  (36.3 C), temperature source Oral, resp. rate 18, height 5\' 2"  (1.575 m), weight 164 lb 1.6 oz (74.435 kg). Patient is alert and in no acute distress. Conjunctiva is pink. Sclera is nonicteric Oropharyngeal mucosa is normal. No neck masses or thyromegaly noted. Cardiac exam with regular rhythm normal S1 and S2. No murmur or gallop noted. Lungs are clear to auscultation. Abdomen is full. Bowel sounds are normal. Abdomen is soft and nontender without organomegaly or masses the  No LE edema or clubbing noted.    Assessment:  #1. Irritable bowel syndrome. She is still having intermittent abdominal cramps. She does not have a Lyme symptoms. Overall she appears to be doing better. #2. GERD. Symptoms well-controlled with therapy. She was not able to find out cost of 20 mg of Nexium daily. Will leave her on the current dose for now.  Plan:  Continue high fiber diet. I assignments sublingual 1 tablet 4 times a day when necessary. Align or equivalent one capsule by mouth daily. She will call if symptoms get worse in which case we'll proceed with colonoscopy otherwise office visit in one year.

## 2012-12-30 ENCOUNTER — Ambulatory Visit (INDEPENDENT_AMBULATORY_CARE_PROVIDER_SITE_OTHER): Payer: 59 | Admitting: Internal Medicine

## 2012-12-30 ENCOUNTER — Other Ambulatory Visit (HOSPITAL_COMMUNITY): Payer: Self-pay | Admitting: Family Medicine

## 2012-12-30 DIAGNOSIS — Z139 Encounter for screening, unspecified: Secondary | ICD-10-CM

## 2013-01-02 ENCOUNTER — Ambulatory Visit (HOSPITAL_COMMUNITY)
Admission: RE | Admit: 2013-01-02 | Discharge: 2013-01-02 | Disposition: A | Discharge status: Home / Self Care | Payer: 59 | Source: Ambulatory Visit | Attending: Family Medicine | Admitting: Family Medicine

## 2013-01-02 DIAGNOSIS — Z1231 Encounter for screening mammogram for malignant neoplasm of breast: Secondary | ICD-10-CM | POA: Insufficient documentation

## 2013-01-02 DIAGNOSIS — Z139 Encounter for screening, unspecified: Secondary | ICD-10-CM

## 2013-04-08 ENCOUNTER — Encounter (HOSPITAL_COMMUNITY): Payer: Self-pay | Admitting: Emergency Medicine

## 2013-04-08 ENCOUNTER — Emergency Department (HOSPITAL_COMMUNITY): Payer: 59

## 2013-04-08 ENCOUNTER — Emergency Department (HOSPITAL_COMMUNITY)
Admission: EM | Admit: 2013-04-08 | Discharge: 2013-04-08 | Disposition: A | Discharge status: Home / Self Care | Payer: 59 | Attending: Emergency Medicine | Admitting: Emergency Medicine

## 2013-04-08 DIAGNOSIS — W010XXA Fall on same level from slipping, tripping and stumbling without subsequent striking against object, initial encounter: Secondary | ICD-10-CM | POA: Insufficient documentation

## 2013-04-08 DIAGNOSIS — W1809XA Striking against other object with subsequent fall, initial encounter: Secondary | ICD-10-CM | POA: Insufficient documentation

## 2013-04-08 DIAGNOSIS — M47817 Spondylosis without myelopathy or radiculopathy, lumbosacral region: Secondary | ICD-10-CM | POA: Insufficient documentation

## 2013-04-08 DIAGNOSIS — I4891 Unspecified atrial fibrillation: Secondary | ICD-10-CM | POA: Insufficient documentation

## 2013-04-08 DIAGNOSIS — Z7982 Long term (current) use of aspirin: Secondary | ICD-10-CM | POA: Insufficient documentation

## 2013-04-08 DIAGNOSIS — IMO0002 Reserved for concepts with insufficient information to code with codable children: Secondary | ICD-10-CM | POA: Insufficient documentation

## 2013-04-08 DIAGNOSIS — Z880 Allergy status to penicillin: Secondary | ICD-10-CM | POA: Insufficient documentation

## 2013-04-08 DIAGNOSIS — Y9389 Activity, other specified: Secondary | ICD-10-CM | POA: Insufficient documentation

## 2013-04-08 DIAGNOSIS — W19XXXA Unspecified fall, initial encounter: Secondary | ICD-10-CM

## 2013-04-08 DIAGNOSIS — Z79899 Other long term (current) drug therapy: Secondary | ICD-10-CM | POA: Insufficient documentation

## 2013-04-08 DIAGNOSIS — I10 Essential (primary) hypertension: Secondary | ICD-10-CM | POA: Insufficient documentation

## 2013-04-08 DIAGNOSIS — Y9289 Other specified places as the place of occurrence of the external cause: Secondary | ICD-10-CM | POA: Insufficient documentation

## 2013-04-08 DIAGNOSIS — S0081XA Abrasion of other part of head, initial encounter: Secondary | ICD-10-CM

## 2013-04-08 DIAGNOSIS — Z8669 Personal history of other diseases of the nervous system and sense organs: Secondary | ICD-10-CM | POA: Insufficient documentation

## 2013-04-08 MED ORDER — BACITRACIN ZINC 500 UNIT/GM EX OINT
TOPICAL_OINTMENT | CUTANEOUS | Status: AC
  Filled 2013-04-08: qty 0.9

## 2013-04-08 NOTE — ED Provider Notes (Signed)
CSN: [id number]     Arrival date & time 04/08/13  9485 History  This chart was scribed for Carmin Muskrat, MD by Rolanda Lundborg, ED Scribe. This patient was seen in room APA18/APA18 and the patient's care was started at 7:43 AM.    Chief Complaint  Patient presents with  . Fall   The history is provided by the patient. No language interpreter was used.   HPI Comments: ARDA DAGGS is a 73 y.o. female who presents to the Emergency Department complaining of headache, right knee pain, and forehead abrasion after slipping and falling in the parking lot on the way in to work here. She reports everything was normal before the fall. She states the knee pain has resolved but the headache is worsening. She is unsure whether she lost consciousness. She denies numbness, weakness, tingling, neck pain, vision changes, CP, SOB. She is otherwise healthy. She takes baby aspirin as a prophylactic.   Past Medical History  Diagnosis Date  . Paroxysmal atrial fibrillation     normal echiocardiogram and stress nuclear; low TSH with normal T3 and T4   . HTN (hypertension)   . 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 Low TSH level 01/2010  . Neuromuscular disorder    Past Surgical History  Procedure Laterality Date  . Vein ligation and stripping    . Appendectomy    . Cholecystectomy    . Abdominal hysterectomy      w/o oophorectomy  . Bunionectomy      bilateral  . Tonsillectomy    . Tonsillectomy    . Esophagogastroduodenoscopy  12/27/2011    Procedure: ESOPHAGOGASTRODUODENOSCOPY (EGD);  Surgeon: Rogene Houston, MD;  Location: [facility];  Service: Endoscopy;  Laterality: N/A;  250   Family History  Problem Relation Age of Onset  . Heart disease Father   . Hypertension Father   . Diabetes Mother   . Hypertension Mother    History  Substance Use Topics  . Smoking status: Never Smoker   . Smokeless tobacco: Never Used     Comment: does not  smoke  . Alcohol Use: Yes     Comment: 1 glass of wine at a time just socially   OB History   Grav Para Term Preterm Abortions TAB SAB Ect Mult Living                 Review of Systems  Constitutional:       Per HPI, otherwise negative  HENT:       Per HPI, otherwise negative  Respiratory:       Per HPI, otherwise negative  Cardiovascular:       Per HPI, otherwise negative  Gastrointestinal: Negative for vomiting.  Endocrine:       Negative aside from HPI  Genitourinary:       Neg aside from HPI   Musculoskeletal:       Per HPI, otherwise negative  Skin: Negative.   Neurological: Syncope: unsure.    Allergies  Penicillins and Sulfonamide derivatives  Home Medications   Current Outpatient Rx  Name  Route  Sig  Dispense  Refill  . aspirin 81 MG EC tablet   Oral   Take 81 mg by mouth daily.           . bifidobacterium infantis (ALIGN) capsule   Oral   Take 1 capsule by mouth daily.   30 capsule  1   . Calcium-Vitamin D 600-125 MG-UNIT TABS   Oral   Take 1 tablet by mouth daily.           Marland Kitchen esomeprazole (NEXIUM) 40 MG capsule   Oral   Take 1 capsule (40 mg total) by mouth daily.   30 capsule   11   . fish oil-omega-3 fatty acids 1000 MG capsule   Oral   Take 2 g by mouth daily.         . hyoscyamine (LEVSIN SL) 0.125 MG SL tablet   Sublingual   Place 1 tablet (0.125 mg total) under the tongue 4 (four) times daily as needed for cramping.   60 tablet   2   . lisinopril-hydrochlorothiazide (PRINZIDE,ZESTORETIC) 20-25 MG per tablet   Oral   Take 1 tablet by mouth daily.         . metoprolol tartrate (LOPRESSOR) 25 MG tablet   Oral   Take 25 mg by mouth 2 (two) times daily.          . Multiple Vitamins-Minerals (CENTRUM SILVER PO)   Oral   Take 1 tablet by mouth daily.          . Multiple Vitamins-Minerals (HAIR/SKIN/NAILS PO)   Oral   Take 1 tablet by mouth daily.         Marland Kitchen zinc gluconate 50 MG tablet   Oral   Take 50 mg by  mouth daily.            BP 155/73  Pulse 71  Temp(Src) 98.4 F (36.9 C) (Oral)  Resp 16  Ht 5\' 2"  (1.575 m)  Wt 164 lb (74.39 kg)  BMI 29.99 kg/m2  SpO2 100% Physical Exam  Nursing note and vitals reviewed. Constitutional: She is oriented to person, place, and time. She appears well-developed and well-nourished. No distress.  HENT:  Head: Normocephalic and atraumatic.  Eyes: Conjunctivae and EOM are normal. Pupils are equal, round, and reactive to light.  Neck: Normal range of motion.  No midline tenderness  Cardiovascular: Normal rate and regular rhythm.   Pulmonary/Chest: Effort normal and breath sounds normal. No stridor. No respiratory distress.  Abdominal: She exhibits no distension.  Musculoskeletal: She exhibits no edema.  Neurological: She is alert and oriented to person, place, and time. No cranial nerve deficit.  Skin: Skin is warm and dry.  Psychiatric: She has a normal mood and affect.    ED Course  Procedures (including critical care time) Medications - No data to display  DIAGNOSTIC STUDIES: Oxygen Saturation is 100% on RA, normal by my interpretation.    COORDINATION OF CARE: 7:49 AM- Discussed treatment plan with pt which includes CT head. Pt agrees to plan.    Labs Review Labs Reviewed - No data to display Imaging Review No results found.  EKG Interpretation   None       MDM   1. Fall   2. Abrasion of forehead     I personally performed the services described in this documentation, which was scribed in my presence. The recorded information has been reviewed and is accurate.   Patient presents with a mechanical fall.  Patient remained hemodynamically stable here, had unremarkable evaluation, was discharged in stable condition.  Carmin Muskrat, MD 04/09/13 [phone]

## 2013-04-08 NOTE — ED Notes (Signed)
Pt fell in employee parking lot. Slipped on a rock. Abrasion to forehead and right knee. Pt denies neck or back pain or tenderness.

## 2013-04-08 NOTE — ED Notes (Signed)
Pt presents to ED after a fall that occurred in hospital parking lot. Pt states she tripped and fell. Pt hit her head on pavement but denies LOC. Pt reports pain in right knee as well. Denies neck and back pain. Pt has abrasion to right side of forehead.

## 2013-04-08 NOTE — Discharge Instructions (Signed)
As discussed, do not hesitate to return here if you have new, or concerning changes in your condition.  Otherwise he may follow up with your physician for further evaluation and management of your wound.

## 2013-04-23 ENCOUNTER — Other Ambulatory Visit (INDEPENDENT_AMBULATORY_CARE_PROVIDER_SITE_OTHER): Payer: Self-pay | Admitting: Internal Medicine

## 2013-04-23 DIAGNOSIS — K219 Gastro-esophageal reflux disease without esophagitis: Secondary | ICD-10-CM

## 2013-04-23 MED ORDER — PANTOPRAZOLE SODIUM 40 MG PO TBEC: 40.0000 mg | DELAYED_RELEASE_TABLET | Freq: Every day | ORAL | Status: DC

## 2013-04-23 NOTE — Telephone Encounter (Signed)
Rx sent 

## 2013-06-11 ENCOUNTER — Ambulatory Visit (INDEPENDENT_AMBULATORY_CARE_PROVIDER_SITE_OTHER): Payer: 59 | Admitting: Otolaryngology

## 2013-06-11 DIAGNOSIS — J310 Chronic rhinitis: Secondary | ICD-10-CM

## 2013-08-13 ENCOUNTER — Ambulatory Visit (INDEPENDENT_AMBULATORY_CARE_PROVIDER_SITE_OTHER): Payer: 59 | Admitting: Otolaryngology

## 2013-08-13 DIAGNOSIS — K219 Gastro-esophageal reflux disease without esophagitis: Secondary | ICD-10-CM

## 2013-08-13 DIAGNOSIS — R490 Dysphonia: Secondary | ICD-10-CM

## 2013-08-13 DIAGNOSIS — H903 Sensorineural hearing loss, bilateral: Secondary | ICD-10-CM

## 2013-09-01 ENCOUNTER — Encounter (INDEPENDENT_AMBULATORY_CARE_PROVIDER_SITE_OTHER): Payer: Self-pay | Admitting: *Deleted

## 2013-09-24 ENCOUNTER — Ambulatory Visit (INDEPENDENT_AMBULATORY_CARE_PROVIDER_SITE_OTHER): Payer: 59 | Admitting: Otolaryngology

## 2013-09-24 DIAGNOSIS — K219 Gastro-esophageal reflux disease without esophagitis: Secondary | ICD-10-CM

## 2013-09-24 DIAGNOSIS — R490 Dysphonia: Secondary | ICD-10-CM

## 2013-11-05 ENCOUNTER — Ambulatory Visit (INDEPENDENT_AMBULATORY_CARE_PROVIDER_SITE_OTHER): Payer: 59 | Admitting: Otolaryngology

## 2013-11-05 DIAGNOSIS — K219 Gastro-esophageal reflux disease without esophagitis: Secondary | ICD-10-CM

## 2013-11-05 DIAGNOSIS — R490 Dysphonia: Secondary | ICD-10-CM

## 2013-12-17 ENCOUNTER — Encounter: Payer: Self-pay | Admitting: Cardiology

## 2013-12-17 ENCOUNTER — Ambulatory Visit (INDEPENDENT_AMBULATORY_CARE_PROVIDER_SITE_OTHER): Payer: 59 | Admitting: Cardiology

## 2013-12-17 VITALS — BP 128/82 | HR 58 | Ht 62.0 in | Wt 165.0 lb

## 2013-12-17 DIAGNOSIS — I10 Essential (primary) hypertension: Secondary | ICD-10-CM

## 2013-12-17 DIAGNOSIS — R0789 Other chest pain: Secondary | ICD-10-CM

## 2013-12-17 NOTE — Progress Notes (Signed)
Clinical Summary Ms. Fackler is a 73 y.o.female seen today as a new patient, she was formerly seen by Dr Lattie Haw but has not seen our clinic in over 3 years. This is our first visit together, she is seen for the following medical problems.    1. Parox afib - from Dr Earline Mayotte notes appears to have been one isolated episode that occurred in the setting of high physical activity, excessive heat, and being on prednisone. She has not had symptomatic recurrence - denies any recent palpitations  2. Chest pain - episode occurred Friday after dinner. Episode while walking to kitchen, felt dizzy. Chest felt tight left chest, 5/10. Felt diaphoretic. No other symptoms. Pain lasted approx 1 hour. Not positional. Different from prior chest pain.  - No DOE. No orthopnea, no PND.   CAD risk factors: HTN, father MI in late 75s, age    40. HTN - compliant with meds  Past Medical History  Diagnosis Date  . Paroxysmal atrial fibrillation     normal echiocardiogram and stress nuclear; low TSH with normal T3 and T4   . HTN (hypertension)   . 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 Low TSH level 01/2010  . Neuromuscular disorder      Allergies  Allergen Reactions  . Penicillins Other (See Comments)    Unknown   . Sulfonamide Derivatives Other (See Comments)    Leg Pain     Current Outpatient Prescriptions  Medication Sig Dispense Refill  . aspirin 81 MG EC tablet Take 81 mg by mouth at bedtime.       . Calcium-Vitamin D 600-125 MG-UNIT TABS Take 1 tablet by mouth daily.        Marland Kitchen esomeprazole (NEXIUM) 40 MG capsule Take 1 capsule (40 mg total) by mouth daily.  30 capsule  11  . fish oil-omega-3 fatty acids 1000 MG capsule Take 2 g by mouth daily.      Marland Kitchen lisinopril-hydrochlorothiazide (PRINZIDE,ZESTORETIC) 20-25 MG per tablet Take 1 tablet by mouth daily.      . metoprolol tartrate (LOPRESSOR) 25 MG tablet Take 25 mg by mouth 2 (two) times  daily.       . Multiple Vitamins-Minerals (HAIR/SKIN/NAILS PO) Take 1 tablet by mouth daily.      . pantoprazole (PROTONIX) 40 MG tablet Take 1 tablet (40 mg total) by mouth daily.  90 tablet  4  . zinc gluconate 50 MG tablet Take 50 mg by mouth daily.         No current facility-administered medications for this visit.     Past Surgical History  Procedure Laterality Date  . Vein ligation and stripping    . Appendectomy    . Cholecystectomy    . Abdominal hysterectomy      w/o oophorectomy  . Bunionectomy      bilateral  . Tonsillectomy    . Tonsillectomy    . Esophagogastroduodenoscopy  12/27/2011    Procedure: ESOPHAGOGASTRODUODENOSCOPY (EGD);  Surgeon: Rogene Houston, MD;  Location: [facility];  Service: Endoscopy;  Laterality: N/A;  250     Allergies  Allergen Reactions  . Penicillins Other (See Comments)    Unknown   . Sulfonamide Derivatives Other (See Comments)    Leg Pain      Family History  Problem Relation Age of Onset  . Heart disease Father   . Hypertension Father   . Diabetes Mother   .  Hypertension Mother      Social History Ms. Middlesworth reports that she has never smoked. She has never used smokeless tobacco. Ms. Rubalcava reports that she drinks alcohol.   Review of Systems CONSTITUTIONAL: No weight loss, fever, chills, weakness or fatigue.  HEENT: Eyes: No visual loss, blurred vision, double vision or yellow sclerae.No hearing loss, sneezing, congestion, runny nose or sore throat.  SKIN: No rash or itching.  CARDIOVASCULAR: per HPI RESPIRATORY: No shortness of breath, cough or sputum.  GASTROINTESTINAL: No anorexia, nausea, vomiting or diarrhea. No abdominal pain or blood.  GENITOURINARY: No burning on urination, no polyuria NEUROLOGICAL: No headache, dizziness, syncope, paralysis, ataxia, numbness or tingling in the extremities. No change in bowel or bladder control.  MUSCULOSKELETAL: No muscle, back pain, joint pain or stiffness.    LYMPHATICS: No enlarged nodes. No history of splenectomy.  PSYCHIATRIC: No history of depression or anxiety.  ENDOCRINOLOGIC: No reports of sweating, cold or heat intolerance. No polyuria or polydipsia.  Marland Kitchen   Physical Examination p 58 bp 128/82 Wt 165 lbs BMI 30 Gen: resting comfortably, no acute distress HEENT: no scleral icterus, pupils equal round and reactive, no palptable cervical adenopathy,  CV: RRR, no m/r/g, no JVD, no carotid bruits Resp: Clear to auscultation bilaterally GI: abdomen is soft, non-tender, non-distended, normal bowel sounds, no hepatosplenomegaly MSK: extremities are warm, no edema.  Skin: warm, no rash Neuro:  no focal deficits Psych: appropriate affect   Diagnostic Studies  12/17/13 EKG NSR, LAFB   Assessment and Plan  1. Parox afib - from notes appears to have been an isolated symptomatic episode with identifiable cause, no recurrence of symptoms - continue to follow clinically  2. Chest pain - unclear etiology, symptoms mixed for possible cardiac cause. She does have CAD risk factors - will obtain exercise cardiolite to further evalute.   3. HTN - at goal, continue current meds    Arnoldo Lenis, M.D.

## 2013-12-17 NOTE — Patient Instructions (Signed)
Your physician recommends that you schedule a follow-up appointment in: to be determined after stress test     Your physician has requested that you have en exercise stress myoview. For further information please visit [email or web address]. Please follow instruction sheet, as given.  PLEASE HOLD METOPROLOL THE AM OF TEST        Thank you for choosing [name] !

## 2013-12-21 ENCOUNTER — Ambulatory Visit (INDEPENDENT_AMBULATORY_CARE_PROVIDER_SITE_OTHER): Payer: 59 | Admitting: Internal Medicine

## 2013-12-21 ENCOUNTER — Encounter (INDEPENDENT_AMBULATORY_CARE_PROVIDER_SITE_OTHER): Payer: Self-pay | Admitting: Internal Medicine

## 2013-12-21 VITALS — BP 124/64 | HR 64 | Temp 98.0°F | Ht 62.0 in | Wt 163.6 lb

## 2013-12-21 DIAGNOSIS — K219 Gastro-esophageal reflux disease without esophagitis: Secondary | ICD-10-CM

## 2013-12-21 NOTE — Patient Instructions (Signed)
Continue to Protonix. OV in 1 yr.

## 2013-12-21 NOTE — Progress Notes (Signed)
Subjective:     Patient ID:  [name], female   DOB: April 13, 1940, 73 y.o.   MRN: [id number]  HPI Here today for her GERD. She said last night she had acid reflux up into her esophagus. She tells me she forgot to take her Protonix yesterday. She does watch what she eats. Appetite is good. No weight loss. Acid reflux is controlled for the most part with Protonix. Working full time. She is trying to exercise daily. She goes to the gym at [facility]   12/27/2011 EGD: Impression:  Small sliding hiatal hernia otherwise normal EGD.     Review of Systems Past Medical History  Diagnosis Date  . Paroxysmal atrial fibrillation     normal echiocardiogram and stress nuclear; low TSH with normal T3 and T4   . HTN (hypertension)   . 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 Low TSH level 01/2010  . Neuromuscular disorder     Past Surgical History  Procedure Laterality Date  . Vein ligation and stripping    . Appendectomy    . Cholecystectomy    . Abdominal hysterectomy      w/o oophorectomy  . Bunionectomy      bilateral  . Tonsillectomy    . Tonsillectomy    . Esophagogastroduodenoscopy  12/27/2011    Procedure: ESOPHAGOGASTRODUODENOSCOPY (EGD);  Surgeon: Rogene Houston, MD;  Location: [facility];  Service: Endoscopy;  Laterality: N/A;  250    Allergies  Allergen Reactions  . Penicillins Other (See Comments)    Unknown   . Sulfonamide Derivatives Other (See Comments)    Leg Pain    Current Outpatient Prescriptions on File Prior to Visit  Medication Sig Dispense Refill  . aspirin 81 MG EC tablet Take 81 mg by mouth at bedtime.       . fish oil-omega-3 fatty acids 1000 MG capsule Take 1 g by mouth daily.       Marland Kitchen lisinopril-hydrochlorothiazide (PRINZIDE,ZESTORETIC) 10-12.5 MG per tablet Take 1 tablet by mouth daily.      . metoprolol tartrate (LOPRESSOR) 25 MG tablet Take 25 mg by mouth 2 (two) times daily.       . Multiple  Vitamins-Minerals (HAIR/SKIN/NAILS PO) Take 1 tablet by mouth daily.      . pantoprazole (PROTONIX) 40 MG tablet Take 1 tablet (40 mg total) by mouth daily.  90 tablet  4  . zinc gluconate 50 MG tablet Take 50 mg by mouth daily.         No current facility-administered medications on file prior to visit.        Objective:   Physical Exam   Filed Vitals:   12/21/13 0916  BP: 124/64  Pulse: 64  Temp: 98 F (36.7 C)  Height: 5\' 2"  (1.575 m)  Weight: 163 lb 9.6 oz (74.208 kg)   Alert and oriented. Skin warm and dry. Oral mucosa is moist.   . Sclera anicteric, conjunctivae is pink. Thyroid not enlarged. No cervical lymphadenopathy. Lungs clear. Heart regular rate and rhythm.  Abdomen is soft. Bowel sounds are positive. No hepatomegaly. No abdominal masses felt. No tenderness.  No edema to lower extremities.        Assessment:     GERD controlled at this time with Protonix. She seems to be doing well as long as she takes the Protonix.    Plan:     OV in 1 yr. Continue the  Protonix.

## 2013-12-22 ENCOUNTER — Encounter (HOSPITAL_COMMUNITY): Payer: 59

## 2013-12-22 ENCOUNTER — Ambulatory Visit (HOSPITAL_COMMUNITY): Payer: 59

## 2013-12-25 ENCOUNTER — Encounter (HOSPITAL_COMMUNITY)
Admission: RE | Admit: 2013-12-25 | Discharge: 2013-12-25 | Disposition: A | Discharge status: Home / Self Care | Payer: 59 | Source: Ambulatory Visit | Attending: Cardiology | Admitting: Cardiology

## 2013-12-25 ENCOUNTER — Ambulatory Visit (HOSPITAL_COMMUNITY)
Admission: RE | Admit: 2013-12-25 | Discharge: 2013-12-25 | Disposition: A | Discharge status: Home / Self Care | Payer: 59 | Source: Ambulatory Visit | Attending: Cardiology | Admitting: Cardiology

## 2013-12-25 ENCOUNTER — Encounter (HOSPITAL_COMMUNITY): Payer: Self-pay

## 2013-12-25 DIAGNOSIS — I10 Essential (primary) hypertension: Secondary | ICD-10-CM | POA: Insufficient documentation

## 2013-12-25 DIAGNOSIS — R0789 Other chest pain: Secondary | ICD-10-CM | POA: Insufficient documentation

## 2013-12-25 DIAGNOSIS — R079 Chest pain, unspecified: Secondary | ICD-10-CM

## 2013-12-25 MED ORDER — TECHNETIUM TC 99M SESTAMIBI - CARDIOLITE
10.0000 | Freq: Once | INTRAVENOUS | Status: AC | PRN
  Administered 2013-12-25: 10 via INTRAVENOUS

## 2013-12-25 MED ORDER — SODIUM CHLORIDE 0.9 % IJ SOLN
10.0000 mL | INTRAMUSCULAR | Status: DC | PRN
  Administered 2013-12-25: 10 mL via INTRAVENOUS

## 2013-12-25 MED ORDER — REGADENOSON 0.4 MG/5ML IV SOLN
INTRAVENOUS | Status: AC
  Filled 2013-12-25: qty 5

## 2013-12-25 MED ORDER — SODIUM CHLORIDE 0.9 % IJ SOLN
INTRAMUSCULAR | Status: AC
  Administered 2013-12-25: 10 mL via INTRAVENOUS
  Filled 2013-12-25: qty 10

## 2013-12-25 MED ORDER — TECHNETIUM TC 99M SESTAMIBI GENERIC - CARDIOLITE
30.0000 | Freq: Once | INTRAVENOUS | Status: AC | PRN
  Administered 2013-12-25: 30 via INTRAVENOUS

## 2013-12-25 NOTE — Progress Notes (Signed)
Stress Lab Nurses Notes - [facility]  [name] 12/25/2013 Reason for doing test: Chest Pain Type of test: Stress Myoview/Cardiolite Nurse performing test: Benay Pillow RN Nuclear Medicine Tech: Melburn Hake Echo Tech: Not Applicable MD performing test: S. McDowell/K lawrence NP Family MD: Dr. Scotty Court Test explained and consent signed: Yes.   IV started: Saline lock started in radiology Symptoms: Chest Tightness Treatment/Intervention: None Reason test stopped: protocol completed After recovery IV was: Discontinued via X-ray tech and No redness or edema Patient to return to [place]. Med at : 1105 Patient discharged: Home Patient's Condition upon discharge was: stable Comments: symptoms resolved in recovery. Peak BP 168/81, HR, 171, recovery bp 128/83, HR 93 [name] M

## 2013-12-28 ENCOUNTER — Other Ambulatory Visit (HOSPITAL_COMMUNITY): Payer: Self-pay | Admitting: Family Medicine

## 2013-12-28 DIAGNOSIS — Z1231 Encounter for screening mammogram for malignant neoplasm of breast: Secondary | ICD-10-CM

## 2013-12-31 ENCOUNTER — Ambulatory Visit (INDEPENDENT_AMBULATORY_CARE_PROVIDER_SITE_OTHER): Payer: 59 | Admitting: Otolaryngology

## 2013-12-31 DIAGNOSIS — R490 Dysphonia: Secondary | ICD-10-CM

## 2013-12-31 DIAGNOSIS — K219 Gastro-esophageal reflux disease without esophagitis: Secondary | ICD-10-CM

## 2014-01-05 ENCOUNTER — Encounter: Payer: Self-pay | Admitting: Adult Health

## 2014-01-05 ENCOUNTER — Ambulatory Visit (INDEPENDENT_AMBULATORY_CARE_PROVIDER_SITE_OTHER): Payer: 59 | Admitting: Adult Health

## 2014-01-05 VITALS — BP 124/72 | HR 75 | Ht 62.0 in | Wt 163.0 lb

## 2014-01-05 DIAGNOSIS — I480 Paroxysmal atrial fibrillation: Secondary | ICD-10-CM

## 2014-01-05 DIAGNOSIS — I10 Essential (primary) hypertension: Secondary | ICD-10-CM

## 2014-01-05 MED ORDER — LISINOPRIL-HYDROCHLOROTHIAZIDE 10-12.5 MG PO TABS: 1.0000 | ORAL_TABLET | Freq: Every day | ORAL | Status: DC

## 2014-01-05 MED ORDER — METOPROLOL TARTRATE 25 MG PO TABS: 25.0000 mg | ORAL_TABLET | Freq: Two times a day (BID) | ORAL | Status: DC

## 2014-01-05 NOTE — Progress Notes (Deleted)
Name: [name]    DOB: [birth year]  Age: 73 y.o.  MR#: [id number]       PCP:  Deloria Lair, MD      Insurance: Payor: [employer] / Plan: [employer] / Product Type: *No Product type* /   CC:    Chief Complaint  Patient presents with  . Atrial Fibrillation  . Hypertension    VS Filed Vitals:   01/05/14 1442  BP: 124/72  Pulse: 75  Height: _0  (1.575 m)  Weight: 163 lb (73.936 kg)  SpO2: 95%    Weights Current Weight  01/05/14 163 lb (73.936 kg)  12/21/13 163 lb 9.6 oz (74.208 kg)  12/17/13 165 lb (74.844 kg)    Blood Pressure  BP Readings from Last 3 Encounters:  01/05/14 124/72  12/21/13 124/64  12/17/13 128/82     Admit date:  (Not on file) Last encounter with RMR:  Visit date not found   Allergy Penicillins and Sulfonamide derivatives  Current Outpatient Prescriptions  Medication Sig Dispense Refill  . aspirin 81 MG EC tablet Take 81 mg by mouth at bedtime.       . fish oil-omega-3 fatty acids 1000 MG capsule Take 1 g by mouth daily.       Marland Kitchen lisinopril-hydrochlorothiazide (PRINZIDE,ZESTORETIC) 10-12.5 MG per tablet Take 1 tablet by mouth daily.      . metoprolol tartrate (LOPRESSOR) 25 MG tablet Take 25 mg by mouth 2 (two) times daily.       . Multiple Vitamins-Minerals (HAIR/SKIN/NAILS PO) Take 1 tablet by mouth daily.      . pantoprazole (PROTONIX) 40 MG tablet Take 1 tablet (40 mg total) by mouth daily.  90 tablet  4  . zinc gluconate 50 MG tablet Take 50 mg by mouth daily.         No current facility-administered medications for this visit.    Discontinued Meds:   There are no discontinued medications.  Patient Active Problem List   Diagnosis Date Noted  . Irritable bowel syndrome 06/30/2012  . GERD (gastroesophageal reflux disease) 12/05/2011  . Lumbar spondylosis 07/09/2011  . 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 Disorders of sacrum 05/15/2011  . Paroxysmal atrial fibrillation   . DJD  (degenerative joint disease)   . Low TSH level 01/17/2010  . Hypertension 07/27/2009  . PLANTAR FACIITIS 07/22/2007    LABS    Component Value Date/Time   NA 138 06/24/2010 0250   NA 141 12/28/2008   NA 141 09/08/2007 0552   K 3.3* 06/24/2010 0250   K 4.1 12/28/2008   K 4.2 DELTA CHECK NOTED RESULT REPEATED AND VERIFIED 09/08/2007 0552   CL 107 06/24/2010 0250   CL 104 12/28/2008   CL 111 09/08/2007 0552   CO2 24 06/24/2010 0250   CO2 20 12/28/2008   CO2 26 09/08/2007 0552   GLUCOSE 117* 06/24/2010 0250   GLUCOSE 81 12/28/2008   GLUCOSE 108* 09/08/2007 0552   BUN 18 06/24/2010 0250   BUN 21 12/28/2008   BUN 12 DELTA CHECK NOTED RESULT REPEATED AND VERIFIED 09/08/2007 0552   CREATININE 0.86 06/24/2010 0250   CREATININE 0.92 12/28/2008   CREATININE 0.89 09/08/2007 0552   CALCIUM 9.1 06/24/2010 0250   CALCIUM 9.3 12/28/2008   CALCIUM 8.7 09/08/2007 0552   GFRNONAA >60 06/24/2010 0250   GFRNONAA >60 09/08/2007 0552   GFRNONAA 56* 09/07/2007 0220   GFRAA  Value: >60  The eGFR has been calculated using the MDRD equation. This calculation has not been validated in all clinical situations. eGFR's persistently <60 mL/min signify possible Chronic Kidney Disease. 06/24/2010 0250   GFRAA  Value: >60        The eGFR has been calculated using the MDRD equation. This calculation has not been validated in all clinical 09/08/2007 0552   GFRAA  Value: >60        The eGFR has been calculated using the MDRD equation. This calculation has not been validated in all clinical 09/07/2007 0220   CMP     Component Value Date/Time   NA 138 06/24/2010 0250   K 3.3* 06/24/2010 0250   CL 107 06/24/2010 0250   CO2 24 06/24/2010 0250   GLUCOSE 117* 06/24/2010 0250   BUN 18 06/24/2010 0250   CREATININE 0.86 06/24/2010 0250   CALCIUM 9.1 06/24/2010 0250   GFRNONAA >60 06/24/2010 0250   GFRAA  Value: >60        The eGFR has been calculated using the MDRD equation. This calculation has not been validated in all clinical situations. eGFR's  persistently <60 mL/min signify possible Chronic Kidney Disease. 06/24/2010 0250       Component Value Date/Time   WBC 7.1 06/24/2010 0250   WBC 10.1 09/07/2007 0220   HGB 13.5 06/24/2010 0250   HGB 13.4 09/07/2007 0220   HCT 38.7 06/24/2010 0250   HCT 38.5 09/07/2007 0220   MCV 89.4 06/24/2010 0250   MCV 91.4 09/07/2007 0220    Lipid Panel     Component Value Date/Time   CHOL  Value: 142        ATP III CLASSIFICATION:  <200     mg/dL   Desirable  200-239  mg/dL   Borderline High  >=240    mg/dL   High 09/08/2007 0552   TRIG 215* 09/08/2007 0552   HDL 37* 09/08/2007 0552   CHOLHDL 3.8 09/08/2007 0552   VLDL 43* 09/08/2007 0552   LDLCALC  Value: 62        Total Cholesterol/HDL:CHD Risk Coronary Heart Disease Risk Table                     Men   Women  1/2 Average Risk   3.4   3.3 09/08/2007 0552    ABG No results found for this basename: phart, pco2, pco2art, po2, po2art, hco3, tco2, acidbasedef, o2sat     Lab Results  Component Value Date   TSH 0.345* 09/01/2010   BNP (last 3 results) No results found for this basename: PROBNP,  in the last 8760 hours Cardiac Panel (last 3 results) No results found for this basename: CKTOTAL, CKMB, TROPONINI, RELINDX,  in the last 72 hours  Iron/TIBC/Ferritin/ %Sat No results found for this basename: iron, tibc, ferritin, ironpctsat     EKG Orders placed in visit on 12/17/13  . EKG 12-LEAD     Prior Assessment and Plan Problem List as of 01/05/2014     Cardiovascular and Mediastinum   Hypertension   Last Assessment & Plan   08/29/2010 Office Visit Written 08/29/2010  4:35 PM by Yehuda Savannah, MD     Blood pressure control has been excellent; current medications will be continued.    Paroxysmal atrial fibrillation   Last Assessment & Plan   08/29/2010 Office Visit Edited 09/03/2010  8:18 PM by Yehuda Savannah, MD     No clinical recurrence of atrial fibrillation.  In the  absence of any definite precipitant, I would expect her arrhythmia to recur  eventually.  At the time of the event, there was concern regarding excessive physical activity, excessive heat in the environment and a course of prednisone, but none of these represent definite causes for her arrhythmia.  Laboratory studies from late 2011 were reviewed.  CBC and chemistry profile were normal.  Lipid profile was near optimal.  TSH was low at 0.10.  Free T3 and T4 were apparently checked a few years ago and verified to be normal, but a repeat study will be obtained.  In the absence of recurrent arrhythmia, annual cardiology followup will not be productive.  Kenitra will contact me should she have any further cardiology issues.      Digestive   GERD (gastroesophageal reflux disease)   Irritable bowel syndrome     Musculoskeletal and Integument   PLANTAR FACIITIS   DJD (degenerative joint disease)   Disorders of sacrum   Lumbar spondylosis     Other   Low TSH level   Spinal stenosis, lumbar region, without neurogenic claudication       Imaging: Nm Myocar Multi W/spect W/wall Motion / Ef  12/25/2013   CLINICAL DATA:  73 year old woman with history of paroxysmal atrial fibrillation, hypertension, and chest pain. This study is requested to evaluate for the presence of ischemia.  EXAM: MYOCARDIAL IMAGING WITH SPECT (REST AND EXERCISE)  GATED LEFT VENTRICULAR WALL MOTION STUDY  LEFT VENTRICULAR EJECTION FRACTION  TECHNIQUE: Standard myocardial SPECT imaging was performed after resting intravenous injection of 10 mCi [id number]. Subsequently, exercise tolerance test was performed by the patient under the supervision of the Cardiology staff. At peak-stress, 30 mCi [id number] was injected intravenously and standard myocardial SPECT imaging was performed. Quantitative gated imaging was also performed to evaluate left ventricular wall motion, and estimate left ventricular ejection fraction.  FINDINGS: Baseline tracing shows normal sinus rhythm at 80 beats per min with left  anterior fascicular block, decreased R wave progression. Patient exercised on Bruce protocol for 5 min and 4 seconds achieving a maximum work load of 7 METS. Heart rate increased from 78 beats per min up to 176 per beats per min which exceed 100 percent of the maximal age predicted heart rate. Blood pressure increased from 145/88 up to 185/105. Non limiting chest pain was reported. Patient went into atrial fibrillation during the study associated with frequent ventricular ectopy versus aberrently conducted complexes. She spontaneously converted to sinus tachycardia. No clearly diagnostic ST segment changes were noted in the setting of lead motion.  Analysis of the overall perfusion data finds adequate radiotracer uptake within the myocardium. There is significant gut uptake of radiotracer near the inferior wall at rest.  Perfusion: Moderate-sized, mild intensity, inferior/inferolateral defect that is fixed, possibly related to soft tissue attenuation.  Wall Motion: Gated imaging reveals overall normal LV wall motion.  Left Ventricular Ejection Fraction: 65 %  End diastolic volume 52 ml  End systolic volume 18 ml  IMPRESSION: 1. Probable soft tissue attenuation affecting the inferior/inferolateral wall without definitive evidence of ischemia.  2. No focal LV wall motion abnormalities.  3. Left ventricular ejection fraction 65%  4. Transient atrial fibrillation noted during exercise, associated with chest discomfort as well as PVCs versus aberrently conducted beats.  5. Low-risk stress test findings*.  *2012 Appropriate Use Criteria for Coronary Revascularization Focused Update: J Am Coll Cardiol. 201287;86(7):[id number]://[email or web address]   Electronically Signed   By: SaRozann Lesches.D.   On: 12/25/2013  15:27

## 2014-01-05 NOTE — Patient Instructions (Signed)
Your physician wants you to follow-up in: 6 months. You will receive a reminder letter in the mail two months in advance. If you don't receive a letter, please call our office to schedule the follow-up appointment.  Your physician recommends that you continue on your current medications as directed. Please refer to the Current Medication list given to you today.  I have refilled your metoprolol and lisinopril   Thank you for choosing [facility]!!

## 2014-01-05 NOTE — Progress Notes (Signed)
HPI: [name] is a 73 year old female patient of Dr. branch. That we are following for ongoing assessment and management of paroxysmal atrial fibrillation, chest pain, and hypertension. She was last seen by Dr. Harl Bowie on 12/17/2013. His meds states that paroxysmal atrial fibrillation, was an isolated event, with no identifiable cause, and no recurrence of symptoms. The patient was a goal concerning, hypertension, and she was without further complaints of chest pain. However, the patient had an exercise Cardiolite to evaluate for ischemia.  1. Probable soft tissue attenuation affecting the  inferior/inferolateral wall without definitive evidence of ischemia.  2. No focal LV wall motion abnormalities.  3. Left ventricular ejection fraction 65%  4. Transient atrial fibrillation noted during exercise, associated  with chest discomfort as well as PVCs versus aberrently conducted  beats.  5. Low-risk stress test findings.  I discussed test results with the patient's. She is asymptomatic. Reassurances given. She will continue current medication regimen. Allergies  Allergen Reactions  . Penicillins Other (See Comments)    Unknown   . Sulfonamide Derivatives Other (See Comments)    Leg Pain    Current Outpatient Prescriptions  Medication Sig Dispense Refill  . aspirin 81 MG EC tablet Take 81 mg by mouth at bedtime.       . fish oil-omega-3 fatty acids 1000 MG capsule Take 1 g by mouth daily.       Marland Kitchen lisinopril-hydrochlorothiazide (PRINZIDE,ZESTORETIC) 10-12.5 MG per tablet Take 1 tablet by mouth daily.  90 tablet  1  . metoprolol tartrate (LOPRESSOR) 25 MG tablet Take 1 tablet (25 mg total) by mouth 2 (two) times daily.  180 tablet  1  . Multiple Vitamins-Minerals (HAIR/SKIN/NAILS PO) Take 1 tablet by mouth daily.      . pantoprazole (PROTONIX) 40 MG tablet Take 1 tablet (40 mg total) by mouth daily.  90 tablet  4  . zinc gluconate 50 MG tablet Take 50 mg by mouth daily.         No  current facility-administered medications for this visit.    Past Medical History  Diagnosis Date  . Paroxysmal atrial fibrillation     normal echiocardiogram and stress nuclear; low TSH with normal T3 and T4   . HTN (hypertension)   . 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 Low TSH level 01/2010  . Neuromuscular disorder     Past Surgical History  Procedure Laterality Date  . Vein ligation and stripping    . Appendectomy    . Cholecystectomy    . Abdominal hysterectomy      w/o oophorectomy  . Bunionectomy      bilateral  . Tonsillectomy    . Tonsillectomy    . Esophagogastroduodenoscopy  12/27/2011    Procedure: ESOPHAGOGASTRODUODENOSCOPY (EGD);  Surgeon: Rogene Houston, MD;  Location: [facility];  Service: Endoscopy;  Laterality: N/A;  250    ROS: Review of systems complete and found to be negative unless listed above  PHYSICAL EXAM BP 124/72  Pulse 75  Ht 5\' 2"  (1.575 m)  Wt 163 lb (73.936 kg)  BMI 29.81 kg/m2  SpO2 95% General: Well developed, well nourished, in no acute distress Head: Eyes PERRLA, No xanthomas.   Normal cephalic and atramatic  Lungs: Clear bilaterally to auscultation and percussion. Heart: HRRR S1 S2, without MRG.  Pulses are 2+ & equal.            No carotid  bruit. No JVD.  No abdominal bruits. No femoral bruits. Abdomen: Bowel sounds are positive, abdomen soft and non-tender without masses or                  Hernia's noted. Msk:  Back normal, normal gait. Normal strength and tone for age. Extremities: No clubbing, cyanosis or edema.  DP +1 Neuro: Alert and oriented X 3. Psych:  Good affect, responds appropriately   ASSESSMENT AND PLAN

## 2014-01-05 NOTE — Assessment & Plan Note (Signed)
Currently very well controlled on medication regimen. She is given refills on lisinopril, metoprolol. We will see her again in 6 months unless she is symptomatic. And then annually after that.

## 2014-01-05 NOTE — Assessment & Plan Note (Signed)
Heart rate is currently very well controlled on metoprolol. It is regular. She is not on anticoagulation at this time. We will not start this as she remains in normal sinus rhythm. If she has recurrent atrial fibrillation, we will consider anticoagulation at that time. Stress test is reassuring.

## 2014-01-06 ENCOUNTER — Ambulatory Visit (HOSPITAL_COMMUNITY)
Admission: RE | Admit: 2014-01-06 | Discharge: 2014-01-06 | Disposition: A | Discharge status: Home / Self Care | Payer: 59 | Source: Ambulatory Visit | Attending: Family Medicine | Admitting: Family Medicine

## 2014-01-06 DIAGNOSIS — Z1231 Encounter for screening mammogram for malignant neoplasm of breast: Secondary | ICD-10-CM | POA: Insufficient documentation

## 2014-03-15 ENCOUNTER — Ambulatory Visit (HOSPITAL_COMMUNITY)
Admission: RE | Admit: 2014-03-15 | Discharge: 2014-03-15 | Disposition: A | Discharge status: Home / Self Care | Payer: 59 | Source: Ambulatory Visit | Attending: Orthopedic Surgery | Admitting: Orthopedic Surgery

## 2014-03-15 ENCOUNTER — Other Ambulatory Visit: Payer: Self-pay | Admitting: Orthopedic Surgery

## 2014-03-15 DIAGNOSIS — M4187 Other forms of scoliosis, lumbosacral region: Secondary | ICD-10-CM | POA: Diagnosis not present

## 2014-03-15 DIAGNOSIS — M25551 Pain in right hip: Secondary | ICD-10-CM | POA: Diagnosis present

## 2014-03-15 DIAGNOSIS — M25552 Pain in left hip: Secondary | ICD-10-CM

## 2014-03-15 DIAGNOSIS — M479 Spondylosis, unspecified: Secondary | ICD-10-CM | POA: Insufficient documentation

## 2014-03-15 DIAGNOSIS — M79604 Pain in right leg: Secondary | ICD-10-CM

## 2014-03-15 DIAGNOSIS — M4186 Other forms of scoliosis, lumbar region: Secondary | ICD-10-CM | POA: Diagnosis not present

## 2014-03-16 ENCOUNTER — Ambulatory Visit (INDEPENDENT_AMBULATORY_CARE_PROVIDER_SITE_OTHER): Payer: 59 | Admitting: Orthopedic Surgery

## 2014-03-16 ENCOUNTER — Encounter: Payer: Self-pay | Admitting: Orthopedic Surgery

## 2014-03-16 VITALS — BP 154/85 | Ht 62.0 in | Wt 163.0 lb

## 2014-03-16 DIAGNOSIS — M25561 Pain in right knee: Secondary | ICD-10-CM

## 2014-03-16 NOTE — Progress Notes (Signed)
Patient ID: [name], female   DOB: 02-23-1941, 73 y.o.   MRN: [id number] Patient ID: [name], female   DOB: July 09, 1940, 73 y.o.   MRN: [id number]  Chief Complaint  Patient presents with  . Hip Pain    bilateral hip pain, no known injury  . Knee Problem    Complains of pain in the front of her knee when she's goes up the steps    HPI [name] is a 73 y.o. female.  The patient has a history of spondylosis of the lumbar spine she's had an MRI in the past which show shows multilevel disc disease with degenerative scoliosis but she presents on this occasion primarily complaining of anterior knee pain and difficulty climbing the steps with pain only when climbing the steps. She does report some catching giving way and stiffness especially when she's trying to negotiate stairs. She describes her pain as stabbing and aching and it is not relieved by blue him you she has 7 out of 10 discomfort. HPI  Past Medical History  Diagnosis Date  . Paroxysmal atrial fibrillation     normal echiocardiogram and stress nuclear; low TSH with normal T3 and T4   . HTN (hypertension)   . 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 Low TSH level 01/2010  . Neuromuscular disorder     Past Surgical History  Procedure Laterality Date  . Vein ligation and stripping    . Appendectomy    . Cholecystectomy    . Abdominal hysterectomy      w/o oophorectomy  . Bunionectomy      bilateral  . Tonsillectomy    . Tonsillectomy    . Esophagogastroduodenoscopy  12/27/2011    Procedure: ESOPHAGOGASTRODUODENOSCOPY (EGD);  Surgeon: Rogene Houston, MD;  Location: [facility];  Service: Endoscopy;  Laterality: N/A;  250    Family History  Problem Relation Age of Onset  . Heart disease Father   . Hypertension Father   . Diabetes Mother   . Hypertension Mother     Social History History  Substance Use Topics  . Smoking status: Never Smoker   .  Smokeless tobacco: Never Used     Comment: does not smoke  . Alcohol Use: Yes     Comment: 1 glass of wine at a time just socially    Allergies  Allergen Reactions  . Penicillins Other (See Comments)    Unknown   . Sulfonamide Derivatives Other (See Comments)    Leg Pain    Current Outpatient Prescriptions  Medication Sig Dispense Refill  . aspirin 81 MG EC tablet Take 81 mg by mouth at bedtime.     . fish oil-omega-3 fatty acids 1000 MG capsule Take 1 g by mouth daily.     Marland Kitchen lisinopril-hydrochlorothiazide (PRINZIDE,ZESTORETIC) 10-12.5 MG per tablet Take 1 tablet by mouth daily. 90 tablet 1  . metoprolol tartrate (LOPRESSOR) 25 MG tablet Take 1 tablet (25 mg total) by mouth 2 (two) times daily. 180 tablet 1  . Multiple Vitamins-Minerals (HAIR/SKIN/NAILS PO) Take 1 tablet by mouth daily.    . pantoprazole (PROTONIX) 40 MG tablet Take 1 tablet (40 mg total) by mouth daily. 90 tablet 4  . zinc gluconate 50 MG tablet Take 50 mg by mouth daily.       No current facility-administered medications for this visit.    Review of Systems Review of Systems  Constitutional: Negative.  Cardiovascular: Positive for leg swelling.  Genitourinary: Positive for frequency.  Musculoskeletal: Positive for back pain and arthralgias.  Neurological: Negative.     Blood pressure 154/85, height 5\' 2"  (1.575 m), weight 163 lb (73.936 kg).  Physical Exam Physical Exam  Constitutional: She is oriented to person, place, and time. She appears well-developed and well-nourished. No distress.  HENT:  Head: Normocephalic.  Cardiovascular: Normal rate and intact distal pulses.   Musculoskeletal:  Right knee evaluation. Crepitance and pain from patellofemoral manipulation. No instability McMurray's negative no joint line tenderness no swelling. Full range of motion. All ligament stable motor exam normal grade 5 strength on extension skin intact. Peripheral edema noted. Pulses good. Sensation normal.   Neurological: She is alert and oriented to person, place, and time. She has normal reflexes. She displays normal reflexes. She exhibits normal muscle tone. Coordination normal.  Skin: Skin is warm and dry. No rash noted. She is not diaphoretic. No erythema. No pallor.  Psychiatric: She has a normal mood and affect. Her behavior is normal. Judgment and thought content normal.  Nursing note and vitals reviewed.  ambulatory status normal  Data Reviewed X-rays were done of her back and hips just to make sure she had no exacerbation of her disc disease her hip arthritis and her lumbar spondylosis is stable and her hip right and left show no arthritis I have independently reviewed the x-rays  Assessment    Patellofemoral pain    Plan    Right knee injection, home exercise program with straight leg raises terminal knee extensions quad sets    Procedure note right knee injection verbal consent was obtained to inject right knee joint  Timeout was completed to confirm the site of injection  The medications used were 40 mg of Depo-Medrol and 1% lidocaine 3 cc  Anesthesia was provided by ethyl chloride and the skin was prepped with alcohol.  After cleaning the skin with alcohol a 20-gauge needle was used to inject the right knee joint. There were no complications. A sterile bandage was applied.        Arther Abbott 03/16/2014, 9:13 AM

## 2014-03-16 NOTE — Patient Instructions (Signed)
Knee exercises  You have received an injection of steroids into the joint. 15% of patients will have increased pain within the 24 hours postinjection.   This is transient and will go away.   We recommend that you use ice packs on the injection site for 20 minutes every 2 hours and extra strength Tylenol 2 tablets every 8 as needed until the pain resolves.  If you continue to have pain after taking the Tylenol and using the ice please call the office for further instructions.

## 2014-05-13 ENCOUNTER — Ambulatory Visit (INDEPENDENT_AMBULATORY_CARE_PROVIDER_SITE_OTHER): Payer: 59 | Admitting: Otolaryngology

## 2014-05-13 DIAGNOSIS — K219 Gastro-esophageal reflux disease without esophagitis: Secondary | ICD-10-CM

## 2014-05-13 DIAGNOSIS — R490 Dysphonia: Secondary | ICD-10-CM

## 2014-07-14 ENCOUNTER — Other Ambulatory Visit (HOSPITAL_COMMUNITY)
Admission: RE | Admit: 2014-07-14 | Discharge: 2014-07-14 | Disposition: A | Discharge status: Home / Self Care | Payer: 59 | Source: Ambulatory Visit | Attending: Family Medicine | Admitting: Family Medicine

## 2014-07-14 DIAGNOSIS — R591 Generalized enlarged lymph nodes: Secondary | ICD-10-CM | POA: Insufficient documentation

## 2014-07-14 LAB — CBC WITH DIFFERENTIAL/PLATELET
BASOS ABS: 0.1 10*3/uL (ref 0.0–0.1)
Basophils Relative: 1 % (ref 0–1)
Eosinophils Absolute: 0 10*3/uL (ref 0.0–0.7)
Eosinophils Relative: 1 % (ref 0–5)
HEMATOCRIT: 39.4 % (ref 36.0–46.0)
HEMOGLOBIN: 13.7 g/dL (ref 12.0–15.0)
Lymphocytes Relative: 30 % (ref 12–46)
Lymphs Abs: 1.8 10*3/uL (ref 0.7–4.0)
MCH: 31 pg (ref 26.0–34.0)
MCHC: 34.8 g/dL (ref 30.0–36.0)
MCV: 89.1 fL (ref 78.0–100.0)
Monocytes Absolute: 0.6 10*3/uL (ref 0.1–1.0)
Monocytes Relative: 10 % (ref 3–12)
NEUTROS PCT: 58 % (ref 43–77)
Neutro Abs: 3.6 10*3/uL (ref 1.7–7.7)
Platelets: 187 10*3/uL (ref 150–400)
RBC: 4.42 MIL/uL (ref 3.87–5.11)
RDW: 12.9 % (ref 11.5–15.5)
WBC: 6.1 10*3/uL (ref 4.0–10.5)

## 2014-07-14 LAB — SEDIMENTATION RATE: SED RATE: 14 mm/h (ref 0–22)

## 2014-08-19 ENCOUNTER — Ambulatory Visit (INDEPENDENT_AMBULATORY_CARE_PROVIDER_SITE_OTHER): Payer: 59 | Admitting: Otolaryngology

## 2014-08-19 DIAGNOSIS — D37030 Neoplasm of uncertain behavior of the parotid salivary glands: Secondary | ICD-10-CM | POA: Diagnosis not present

## 2014-08-20 ENCOUNTER — Other Ambulatory Visit (INDEPENDENT_AMBULATORY_CARE_PROVIDER_SITE_OTHER): Payer: Self-pay | Admitting: Otolaryngology

## 2014-08-20 DIAGNOSIS — R221 Localized swelling, mass and lump, neck: Secondary | ICD-10-CM

## 2014-08-24 ENCOUNTER — Ambulatory Visit (HOSPITAL_COMMUNITY)
Admission: RE | Admit: 2014-08-24 | Discharge: 2014-08-24 | Disposition: A | Discharge status: Home / Self Care | Payer: 59 | Source: Ambulatory Visit | Attending: Otolaryngology | Admitting: Otolaryngology

## 2014-08-24 DIAGNOSIS — R221 Localized swelling, mass and lump, neck: Secondary | ICD-10-CM | POA: Insufficient documentation

## 2014-08-24 LAB — POCT I-STAT CREATININE: CREATININE: 0.8 mg/dL (ref 0.44–1.00)

## 2014-08-24 MED ORDER — IOHEXOL 300 MG/ML  SOLN
75.0000 mL | Freq: Once | INTRAMUSCULAR | Status: AC | PRN
  Administered 2014-08-24: 75 mL via INTRAVENOUS

## 2014-08-25 ENCOUNTER — Other Ambulatory Visit (INDEPENDENT_AMBULATORY_CARE_PROVIDER_SITE_OTHER): Payer: Self-pay | Admitting: Otolaryngology

## 2014-08-25 DIAGNOSIS — J9859 Other diseases of mediastinum, not elsewhere classified: Secondary | ICD-10-CM

## 2014-08-27 ENCOUNTER — Ambulatory Visit (HOSPITAL_COMMUNITY)
Admission: RE | Admit: 2014-08-27 | Discharge: 2014-08-27 | Disposition: A | Discharge status: Home / Self Care | Payer: 59 | Source: Ambulatory Visit | Attending: Otolaryngology | Admitting: Otolaryngology

## 2014-08-27 DIAGNOSIS — R221 Localized swelling, mass and lump, neck: Secondary | ICD-10-CM | POA: Diagnosis present

## 2014-08-27 DIAGNOSIS — J9859 Other diseases of mediastinum, not elsewhere classified: Secondary | ICD-10-CM

## 2014-08-27 MED ORDER — IOHEXOL 300 MG/ML  SOLN
75.0000 mL | Freq: Once | INTRAMUSCULAR | Status: AC | PRN
  Administered 2014-08-27: 75 mL via INTRAVENOUS

## 2014-09-02 ENCOUNTER — Ambulatory Visit (INDEPENDENT_AMBULATORY_CARE_PROVIDER_SITE_OTHER): Payer: 59 | Admitting: Otolaryngology

## 2014-09-02 DIAGNOSIS — D37030 Neoplasm of uncertain behavior of the parotid salivary glands: Secondary | ICD-10-CM | POA: Diagnosis not present

## 2014-09-08 ENCOUNTER — Encounter: Payer: Self-pay | Admitting: Cardiothoracic Surgery

## 2014-09-08 ENCOUNTER — Other Ambulatory Visit: Payer: Self-pay | Admitting: *Deleted

## 2014-09-08 ENCOUNTER — Institutional Professional Consult (permissible substitution) (INDEPENDENT_AMBULATORY_CARE_PROVIDER_SITE_OTHER): Payer: 59 | Admitting: Cardiothoracic Surgery

## 2014-09-08 VITALS — BP 148/90 | HR 75 | Resp 20 | Ht 62.0 in | Wt 160.0 lb

## 2014-09-08 DIAGNOSIS — R222 Localized swelling, mass and lump, trunk: Secondary | ICD-10-CM | POA: Diagnosis not present

## 2014-09-08 DIAGNOSIS — J9859 Other diseases of mediastinum, not elsewhere classified: Secondary | ICD-10-CM

## 2014-09-08 NOTE — Progress Notes (Signed)
[name]       [place] [address]             [phone]                    [facility] Medical Record #[id number] Date of Birth: 1940-08-08  Referring: Leta Baptist, MD Primary Care: Deloria Lair, MD  Chief Complaint:    Chief Complaint  Patient presents with  . Mediastinal Mass    Surgical eval, Chest CT 08/27/2014    History of Present Illness:    [name] 74 y.o. female is seen in the office  today for mediastinal mass. Patient denies any symptoms of breathing or swallowing problems. She noted none painful enlargement of preauricular nodular area on the right. CT of neck was done partially revealing a mediastinal mass. Patient has had no fever or chil, or night sweats. She notes occasional hot flashes. She has had no wt loss or gain.    Patient has had history of intermittent afib in past none recently. She was evaluated for chest pain in oct 2015_ Cardiolite stress test by Dr Harl Bowie Overall impression: positive stress test suggestive of ischemia printed but unsigened results  Current Activity/ Functional Status:  Patient is independent with mobility/ambulation, transfers, ADL's, IADL's.   Zubrod Score: At the time of surgery this patient's most appropriate activity status/level should be described as: [x]     0    Normal activity, no symptoms []     1    Restricted in physical strenuous activity but ambulatory, able to do out light work []     2    Ambulatory and capable of self care, unable to do work activities, up and about               >50 % of waking hours                              []     3    Only limited self care, in bed greater than 50% of waking hours []     4    Completely disabled, no self care, confined to bed or chair []     5    Moribund   Past Medical History  Diagnosis Date  . Paroxysmal atrial fibrillation     normal echiocardiogram and stress nuclear; low TSH with normal T3 and T4   . HTN (hypertension)   .  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 Low TSH level 01/2010  . Neuromuscular disorder     Past Surgical History  Procedure Laterality Date  . Vein ligation and stripping    . Appendectomy    . Cholecystectomy    . Abdominal hysterectomy      w/o oophorectomy  . Bunionectomy      bilateral  . Tonsillectomy    . Tonsillectomy    . Esophagogastroduodenoscopy  12/27/2011    Procedure: ESOPHAGOGASTRODUODENOSCOPY (EGD);  Surgeon: Rogene Houston, MD;  Location: [facility];  Service: Endoscopy;  Laterality: N/A;  250    Family History  Problem Relation Age of Onset  . Heart disease Father   . Hypertension Father   . Diabetes Mother   . Hypertension Mother   father died age 31 with appendicitis , Mother died with hypertension age 23 grandmother had large  goiter , one daughter is healthy  History   Social History  . Marital Status: Married    Spouse Name: N/A  . Number of Children: N/A  . Years of Education: N/A   Occupational History  . 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 Smoking status: Never Smoker   . Smokeless tobacco: Never Used     Comment: does not smoke  . Alcohol Use: Yes     Comment: 1 glass of wine at a time just socially  . Drug Use: No  . Sexual Activity: Not on file    Social History Narrative   Married, employment - Network engineer. No caffeine. 72 yr education     History  Smoking status  . Never Smoker   Smokeless tobacco  . Never Used    Comment: does not smoke    History  Alcohol Use  . Yes    Comment: 1 glass of wine at a time just socially     Allergies  Allergen Reactions  . Penicillins Other (See Comments)    Unknown   . Sulfonamide Derivatives Other (See Comments)    Leg Pain    Current Outpatient Prescriptions  Medication Sig Dispense Refill  . aspirin 81 MG EC tablet Take 81 mg by mouth at bedtime.     . fish oil-omega-3 fatty acids 1000 MG capsule  Take 1 g by mouth daily.     Marland Kitchen lisinopril-hydrochlorothiazide (PRINZIDE,ZESTORETIC) 10-12.5 MG per tablet Take 1 tablet by mouth daily. 90 tablet 1  . metoprolol tartrate (LOPRESSOR) 25 MG tablet Take 1 tablet (25 mg total) by mouth 2 (two) times daily. 180 tablet 1  . Multiple Vitamins-Minerals (HAIR/SKIN/NAILS PO) Take 1 tablet by mouth daily.    . ranitidine (ZANTAC) 150 MG tablet Take 150 mg by mouth at bedtime.    Marland Kitchen zinc gluconate 50 MG tablet Take 50 mg by mouth daily.       No current facility-administered medications for this visit.      Review of Systems:     Cardiac Review of Systems: Y or N  Chest Pain [not now  ]  Resting SOB [ n  ] Exertional SOB  [ n ]  Orthopnea [  ]   Pedal Edema [n   ]    Palpitations [n  ] Syncope  Florencio.Farrier  ]   Presyncope Florencio.Farrier   ]  General Review of Systems: [Y] = yes [  ]=no Constitional: recent weight change [  ];  Wt loss over the last 3 months [   ] anorexia [  ]; fatigue [  ]; nausea [n  ]; night sweats [ n ]; fever [  ]; or chills [  ];          Dental: poor dentition[n  ]; Last Dentist visit:   Eye : blurred vision [  ]; diplopia [   ]; vision changes [  ];  Amaurosis fugax[ n ]; Resp: cough [n  ];  wheezing[n  ];  hemoptysis[ n ]; shortness of breath[n  ]; paroxysmal nocturnal dyspnea[n  ]; dyspnea on exertion[ n ]; or orthopnea[n  ];  GI:  gallstones[  ], vomiting[  ];  dysphagia[  ]; melena[  ];  hematochezia [  ]; heartburn[  ];   Hx of  Colonoscopy[  ]; GU: kidney stones [  ]; hematuria[  ];   dysuria [  ];  nocturia[  ];  history of  obstruction [  ]; urinary frequency [  ]             Skin: rash, swelling[  ];, hair loss[  ];  peripheral edema[  ];  or itching[  ]; Musculosketetal: myalgias[  ];  joint swelling[ y ];  joint erythema[ n ];  joint pain[y  ];  back pain[  ];  Heme/Lymph: bruising[  ];  bleeding[  ];  anemia[  ];  Neuro: TIA[  ];  headaches[n  ];  stroke[ n ];  vertigo[  ];  seizures[n  ];   paresthesias[  ];  difficulty walking[n   ];  Psych:depression[n]; anxiety[ n ];  Endocrine: diabetes[ y ];  thyroid dysfunction[y  ];  Immunizations: Flu up to date [  y]; Pneumococcal up to date Blue.Reese  ];  Other:  Physical Exam: BP 148/90 mmHg  Pulse 75  Resp 20  Ht 5\' 2"  (1.575 m)  Wt 160 lb (72.576 kg)  BMI 29.26 kg/m2  SpO2 93%  PHYSICAL EXAMINATION: General appearance: alert, cooperative and appears stated age Head: Normocephalic, without obvious abnormality, atraumatic Neck: no adenopathy, no carotid bruit, no JVD, supple, symmetrical, trachea midline and thyroid not enlarged, symmetric, no tenderness/mass/nodules Lymph nodes: Cervical, supraclavicular, and axillary nodes normal. Resp: clear to auscultation bilaterally Back: symmetric, no curvature. ROM normal. No CVA tenderness. Cardio: regular rate and rhythm, S1, S2 normal, no murmur, click, rub or gallop and no rub GI: soft, non-tender; bowel sounds normal; no masses,  no organomegaly Extremities: extremities normal, atraumatic, no cyanosis or edema and Homans sign is negative, no sign of DVT Neurologic: Grossly normal Palpable dp and pt pulses  Diagnostic Studies & Laboratory data:     Recent Radiology Findings:   Ct Soft Tissue Neck W Contrast  08/24/2014   CLINICAL DATA:  Right-sided neck nodules since March 2016.  EXAM: CT NECK WITH CONTRAST  TECHNIQUE: Multidetector CT imaging of the neck was performed using the standard protocol following the bolus administration of intravenous contrast.  CONTRAST:  75 cc Omnipaque 300 intravenous  COMPARISON:  None.  FINDINGS: Pharynx and larynx: No evidence of mass or inflammatory enhancement.  Salivary glands: There is a lobulated mass within the superficial tail of the right parotid gland measuring up to 19 mm in maximal diameter. No additional salivary gland nodule. Appearance is nonspecific, but not typical of a lymph node. Skullbase foramina are symmetric.  Thyroid: Unremarkable.  Lymph nodes: No adenopathy.  Vascular:  Major cervical vessels are patent.  Limited intracranial: Negative  Visualized orbits: Unremarkable  Mastoids and visualized paranasal sinuses: Clear.  Skeleton: Unremarkable for age.  Upper chest: Lobulated mass in the superior mediastinum from the thoracic inlet extending beyond the lower margin of the scan. The mass measures at least 7 x 3.8 x 5.5 cm. Coarse calcifications are intermittently noted. The appearance suggests a thyroid nodule, although there is no continuity with the otherwise normal-appearing thyroid.  The apical lungs are clear.  IMPRESSION: 1. 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 2. Partly visualized anterior mediastinal mass, at least 7 cm. Although the appearance suggests a thyroid nodule, there is no thyroid continuity. Recommend chest CT follow-up for complete visualization.   Electronically Signed   By: Monte Fantasia M.D.   On: 08/24/2014 18:45   Ct Chest W Contrast  08/27/2014   CLINICAL DATA:  Mediastinal mass on neck CT  EXAM: CT CHEST WITH CONTRAST  TECHNIQUE: Multidetector CT imaging of the chest was  performed during intravenous contrast administration.  CONTRAST:  54mL OMNIPAQUE IOHEXOL 300 MG/ML  SOLN  COMPARISON:  CT neck dated 08/24/2014  FINDINGS: Mediastinum/Nodes: 9.6 x 3.8 x 6.1 cm lobulated, enhancing anterior mediastinal mass with scattered calcifications (series 2/ image 22; sagittal image 66). Possible low-density/macroscopic fat within the lesion (series 2/image 15). Although equivocal, a distinct fat plane appears to exits between the mass and the thyroid gland (series 4/ image 38).  Primary differential consideration is a germ cell tumor. Less likely, benign/malignant thymic neoplasm remains possible.  Exophytic thyroid nodule/neoplasm is considered unlikely. Malignant adenopathy/lymphoma is also considered unlikely.  Visualized thyroid is otherwise unremarkable.  Heart is normal in size.  No pericardial effusion.  Mild atherosclerotic  calcifications of the aortic arch.  Small mediastinal lymph nodes, including a 7 mm short axis [facility] window node and a 7 mm short axis subcarinal node. No suspicious hilar or axillary lymphadenopathy.  Lungs/Pleura: Lungs are essentially clear.  Mild centrilobular and paraseptal emphysematous changes.  No focal consolidation.  No pleural effusion or pneumothorax.  Upper abdomen: Visualized upper abdomen is notable for cholecystectomy clips.  Musculoskeletal: Mild degenerative changes of the visualized thoracolumbar spine.  IMPRESSION: 9.6 x 3.8 x 6.1 cm lobulated, enhancing anterior mediastinal mass, as described above. Primary differential consideration is a mediastinal germ cell tumor. Surgical consultation is suggested.   Electronically Signed   By: Julian Hy M.D.   On: 08/27/2014 17:55     I have independently reviewed the above radiology studies  and reviewed the findings with the patient.   Recent Lab Findings: Lab Results  Component Value Date   WBC 6.1 07/14/2014   HGB 13.7 07/14/2014   HCT 39.4 07/14/2014   PLT 187 07/14/2014   GLUCOSE 117* 06/24/2010   CHOL  09/08/2007    142        ATP III CLASSIFICATION:  <200     mg/dL   Desirable  200-239  mg/dL   Borderline High  >=240    mg/dL   High   TRIG 215* 09/08/2007   HDL 37* 09/08/2007   LDLCALC  09/08/2007    62        Total Cholesterol/HDL:CHD Risk Coronary Heart Disease Risk Table                     Men   Women  1/2 Average Risk   3.4   3.3   NA 138 06/24/2010   K 3.3* 06/24/2010   CL 107 06/24/2010   CREATININE 0.80 08/24/2014   BUN 18 06/24/2010   CO2 24 06/24/2010   TSH 0.345* 09/01/2010   HGBA1C  09/07/2007    5.9 (NOTE)   The ADA recommends the following therapeutic goals for glycemic   control related to Hgb A1C measurement:   Goal of Therapy:   < 7.0% Hgb A1C   Action Suggested:  > 8.0% Hgb A1C   Ref:  Diabetes Care, 22, Suppl. 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 Abnormal stress test- will have Dr Harl Bowie see patient for pre op cardiology clearance      I have reviewed the scans with the patient and her husband and have recommended proceeding with  surgical resection both for treatment and DX. Patient is agreeable. Will plan surgery via sternotomy after crdiac clearance completed.  I  spent 40 minutes counseling the patient face to face and 50% or more the  time was spent in counseling and coordination of care. The total time spent in the appointment was 60 minutes.  Grace Isaac MD      [address],[place] [address] Office [phone]   Beeper [phone]  09/08/2014 1:06 PM

## 2014-09-09 ENCOUNTER — Other Ambulatory Visit (HOSPITAL_COMMUNITY)
Admission: RE | Admit: 2014-09-09 | Discharge: 2014-09-09 | Disposition: A | Discharge status: Home / Self Care | Payer: 59 | Source: Ambulatory Visit | Attending: Cardiothoracic Surgery | Admitting: Cardiothoracic Surgery

## 2014-09-09 DIAGNOSIS — R222 Localized swelling, mass and lump, trunk: Secondary | ICD-10-CM | POA: Diagnosis present

## 2014-09-09 LAB — HCG, QUANTITATIVE, PREGNANCY: hCG, Beta Chain, Quant, S: 4 m[IU]/mL (ref <5)

## 2014-09-10 LAB — AFP TUMOR MARKER: AFP-Tumor Marker: 3.9 ng/mL (ref 0.0–8.3)

## 2014-09-13 ENCOUNTER — Other Ambulatory Visit: Payer: Self-pay

## 2014-09-16 ENCOUNTER — Encounter: Payer: 59 | Admitting: Cardiology

## 2014-09-17 ENCOUNTER — Ambulatory Visit (INDEPENDENT_AMBULATORY_CARE_PROVIDER_SITE_OTHER): Payer: 59 | Admitting: Cardiology

## 2014-09-17 ENCOUNTER — Encounter: Payer: Self-pay | Admitting: Cardiology

## 2014-09-17 VITALS — BP 124/78 | HR 80 | Ht 62.0 in | Wt 163.0 lb

## 2014-09-17 DIAGNOSIS — Z01810 Encounter for preprocedural cardiovascular examination: Secondary | ICD-10-CM | POA: Diagnosis not present

## 2014-09-17 DIAGNOSIS — I480 Paroxysmal atrial fibrillation: Secondary | ICD-10-CM

## 2014-09-17 NOTE — Progress Notes (Signed)
Clinical Summary [name] is a 74 y.o.female seen today for follow up of the following medical problems.   1. Parox afib - from Dr Earline Mayotte notes though to have been one isolated episode that occurred in the setting of high physical activity, excessive heat, and being on prednisone several years ago. Was followed clinically with no clear evidence of recurrence - during stress test 12/2013 episode of afib during exercise stress test - no recent palpitations  2. Mediastinal mass - followed by CT surgery - plans for surgical resection pending cardiac clearance.  - tolerates greater 4 MET easily, 4 flights of stairs regularly Past Medical History  Diagnosis Date  . Paroxysmal atrial fibrillation     normal echiocardiogram and stress nuclear; low TSH with normal T3 and T4   . HTN (hypertension)   . 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 Low TSH level 01/2010  . Neuromuscular disorder      Allergies  Allergen Reactions  . Penicillins Other (See Comments)    Unknown   . Sulfonamide Derivatives Other (See Comments)    Leg Pain     Current Outpatient Prescriptions  Medication Sig Dispense Refill  . aspirin 81 MG EC tablet Take 81 mg by mouth at bedtime.     . fish oil-omega-3 fatty acids 1000 MG capsule Take 1 g by mouth daily.     Marland Kitchen lisinopril-hydrochlorothiazide (PRINZIDE,ZESTORETIC) 10-12.5 MG per tablet Take 1 tablet by mouth daily. 90 tablet 1  . metoprolol tartrate (LOPRESSOR) 25 MG tablet Take 1 tablet (25 mg total) by mouth 2 (two) times daily. 180 tablet 1  . Multiple Vitamins-Minerals (HAIR/SKIN/NAILS PO) Take 1 tablet by mouth daily.    . ranitidine (ZANTAC) 150 MG tablet Take 150 mg by mouth at bedtime.    Marland Kitchen zinc gluconate 50 MG tablet Take 50 mg by mouth daily.       No current facility-administered medications for this visit.     Past Surgical History  Procedure Laterality Date  . Vein ligation and  stripping    . Appendectomy    . Cholecystectomy    . Abdominal hysterectomy      w/o oophorectomy  . Bunionectomy      bilateral  . Tonsillectomy    . Tonsillectomy    . Esophagogastroduodenoscopy  12/27/2011    Procedure: ESOPHAGOGASTRODUODENOSCOPY (EGD);  Surgeon: Rogene Houston, MD;  Location: [facility];  Service: Endoscopy;  Laterality: N/A;  250     Allergies  Allergen Reactions  . Penicillins Other (See Comments)    Unknown   . Sulfonamide Derivatives Other (See Comments)    Leg Pain      Family History  Problem Relation Age of Onset  . Heart disease Father   . Hypertension Father   . Diabetes Mother   . Hypertension Mother      Social History [name] reports that she has never smoked. She has never used smokeless tobacco. [name] reports that she drinks alcohol.   Review of Systems CONSTITUTIONAL: No weight loss, fever, chills, weakness or fatigue.  HEENT: Eyes: No visual loss, blurred vision, double vision or yellow sclerae.No hearing loss, sneezing, congestion, runny nose or sore throat.  SKIN: No rash or itching.  CARDIOVASCULAR: per HPI RESPIRATORY: No shortness of breath, cough or sputum.  GASTROINTESTINAL: No anorexia, nausea, vomiting or diarrhea. No abdominal pain or blood.  GENITOURINARY: No burning on  urination, no polyuria NEUROLOGICAL: No headache, dizziness, syncope, paralysis, ataxia, numbness or tingling in the extremities. No change in bowel or bladder control.  MUSCULOSKELETAL: No muscle, back pain, joint pain or stiffness.  LYMPHATICS: No enlarged nodes. No history of splenectomy.  PSYCHIATRIC: No history of depression or anxiety.  ENDOCRINOLOGIC: No reports of sweating, cold or heat intolerance. No polyuria or polydipsia.  Marland Kitchen   Physical Examination Filed Vitals:   09/17/14 1258  BP: 124/78  Pulse: 80   Filed Vitals:   09/17/14 1258  Height: 5' 2" (1.575 m)  Weight: 163 lb (73.936 kg)    Gen: resting comfortably, no  acute distress HEENT: no scleral icterus, pupils equal round and reactive, no palptable cervical adenopathy,  CV: RRR, no m/r/g, no JVD Resp: Clear to auscultation bilaterally GI: abdomen is soft, non-tender, non-distended, normal bowel sounds, no hepatosplenomegaly MSK: extremities are warm, no edema.  Skin: warm, no rash Neuro:  no focal deficits Psych: appropriate affect   Diagnostic Studies 12/2013 MPI IMPRESSION: 1. Probable soft tissue attenuation affecting the inferior/inferolateral wall without definitive evidence of ischemia.  2. No focal LV wall motion abnormalities.  3. Left ventricular ejection fraction 65%  4. Transient atrial fibrillation noted during exercise, associated with chest discomfort as well as PVCs versus aberrently conducted beats.  5. Low-risk stress test findings*.   Assessment and Plan   1. Parox afib - her afib appears to be recurrent, we discussed her indications for anticoagulation. CHADS2Vasc score is 70 (age x2, gender, HTN). Will plan on starting after her upcoming chest surgery - continue metoprolol  2. Preoperative cardiac evaluation - she is being considered for chest surgery for mediastinal mass - she tolerates greater than 4 METs on a regular basis without significant limitation - stress test 12/2013 official reports shows no ischemia - recommend proceeding with surgery as planned   F/u 4 weeks. At that time readdress anticoagulation   Arnoldo Lenis, M.D.

## 2014-09-17 NOTE — Patient Instructions (Signed)
Your physician recommends that you schedule a follow-up appointment in: 4 weeks with Dr. Harl Bowie.  Your physician recommends that you continue on your current medications as directed. Please refer to the Current Medication list given to you today.  Thank you for choosing [facility]!

## 2014-09-22 ENCOUNTER — Other Ambulatory Visit: Payer: Self-pay | Admitting: *Deleted

## 2014-09-22 DIAGNOSIS — J9859 Other diseases of mediastinum, not elsewhere classified: Secondary | ICD-10-CM

## 2014-09-28 ENCOUNTER — Encounter (INDEPENDENT_AMBULATORY_CARE_PROVIDER_SITE_OTHER): Payer: Self-pay | Admitting: *Deleted

## 2014-09-29 ENCOUNTER — Encounter (HOSPITAL_COMMUNITY)
Admission: RE | Admit: 2014-09-29 | Discharge: 2014-09-29 | Disposition: A | Discharge status: Home / Self Care | Payer: 59 | Source: Ambulatory Visit | Attending: Cardiothoracic Surgery | Admitting: Cardiothoracic Surgery

## 2014-09-29 ENCOUNTER — Encounter (HOSPITAL_COMMUNITY): Payer: Self-pay

## 2014-09-29 ENCOUNTER — Ambulatory Visit (HOSPITAL_COMMUNITY)
Admission: RE | Admit: 2014-09-29 | Discharge: 2014-09-29 | Disposition: A | Discharge status: Home / Self Care | Payer: 59 | Source: Ambulatory Visit | Attending: Cardiothoracic Surgery | Admitting: Cardiothoracic Surgery

## 2014-09-29 VITALS — BP 134/68 | HR 74 | Temp 97.9°F | Resp 20 | Ht 61.0 in | Wt 163.5 lb

## 2014-09-29 DIAGNOSIS — Z01818 Encounter for other preprocedural examination: Secondary | ICD-10-CM

## 2014-09-29 DIAGNOSIS — Z01810 Encounter for preprocedural cardiovascular examination: Secondary | ICD-10-CM

## 2014-09-29 DIAGNOSIS — J9859 Other diseases of mediastinum, not elsewhere classified: Secondary | ICD-10-CM

## 2014-09-29 DIAGNOSIS — R222 Localized swelling, mass and lump, trunk: Secondary | ICD-10-CM | POA: Insufficient documentation

## 2014-09-29 DIAGNOSIS — Z01812 Encounter for preprocedural laboratory examination: Secondary | ICD-10-CM | POA: Insufficient documentation

## 2014-09-29 HISTORY — DX: Other specified postprocedural states: Z98.890

## 2014-09-29 HISTORY — DX: Other specified postprocedural states: R11.2

## 2014-09-29 HISTORY — DX: Nausea with vomiting, unspecified: R11.2

## 2014-09-29 LAB — CBC
HCT: 38.4 % (ref 36.0–46.0)
Hemoglobin: 13.5 g/dL (ref 12.0–15.0)
MCH: 30.7 pg (ref 26.0–34.0)
MCHC: 35.2 g/dL (ref 30.0–36.0)
MCV: 87.3 fL (ref 78.0–100.0)
Platelets: 213 10*3/uL (ref 150–400)
RBC: 4.4 MIL/uL (ref 3.87–5.11)
RDW: 12.5 % (ref 11.5–15.5)
WBC: 6.1 10*3/uL (ref 4.0–10.5)

## 2014-09-29 LAB — COMPREHENSIVE METABOLIC PANEL
ALT: 21 U/L (ref 14–54)
AST: 20 U/L (ref 15–41)
Albumin: 3.6 g/dL (ref 3.5–5.0)
Alkaline Phosphatase: 62 U/L (ref 38–126)
Anion gap: 12 (ref 5–15)
BUN: 11 mg/dL (ref 6–20)
CO2: 20 mmol/L — ABNORMAL LOW (ref 22–32)
Calcium: 9.3 mg/dL (ref 8.9–10.3)
Chloride: 100 mmol/L — ABNORMAL LOW (ref 101–111)
Creatinine, Ser: 0.71 mg/dL (ref 0.44–1.00)
GFR calc Af Amer: >60 mL/min (ref >60)
GFR calc non Af Amer: >60 mL/min (ref >60)
Glucose, Bld: 101 mg/dL — ABNORMAL HIGH (ref 65–99)
Potassium: 3.4 mmol/L — ABNORMAL LOW (ref 3.5–5.1)
Sodium: 132 mmol/L — ABNORMAL LOW (ref 135–145)
Total Bilirubin: 0.7 mg/dL (ref 0.3–1.2)
Total Protein: 7.2 g/dL (ref 6.5–8.1)

## 2014-09-29 LAB — URINALYSIS, ROUTINE W REFLEX MICROSCOPIC
Bilirubin Urine: NEGATIVE
Glucose, UA: NEGATIVE mg/dL
Hgb urine dipstick: NEGATIVE
Ketones, ur: NEGATIVE mg/dL
Nitrite: NEGATIVE
Protein, ur: NEGATIVE mg/dL
Specific Gravity, Urine: 1.005 (ref 1.005–1.030)
Urobilinogen, UA: 0.2 mg/dL (ref 0.0–1.0)
pH: 6 (ref 5.0–8.0)

## 2014-09-29 LAB — PROTIME-INR
INR: 1.03 (ref 0.00–1.49)
Prothrombin Time: 13.7 seconds (ref 11.6–15.2)

## 2014-09-29 LAB — SURGICAL PCR SCREEN
MRSA, PCR: NEGATIVE
Staphylococcus aureus: NEGATIVE

## 2014-09-29 LAB — TYPE AND SCREEN
ABO/RH(D): O POS
Antibody Screen: NEGATIVE

## 2014-09-29 LAB — BLOOD GAS, ARTERIAL
Acid-base deficit: 0.8 mmol/L (ref 0.0–2.0)
Bicarbonate: 22.6 mEq/L (ref 20.0–24.0)
Drawn by: 206361
FIO2: 0.21 %
O2 Saturation: 97 %
Patient temperature: 98.6
TCO2: 23.6 mmol/L (ref 0–100)
pCO2 arterial: 32.6 mmHg — ABNORMAL LOW (ref 35.0–45.0)
pH, Arterial: 7.455 — ABNORMAL HIGH (ref 7.350–7.450)
pO2, Arterial: 88.1 mmHg (ref 80.0–100.0)

## 2014-09-29 LAB — URINE MICROSCOPIC-ADD ON

## 2014-09-29 LAB — ABO/RH: ABO/RH(D): O POS

## 2014-09-29 LAB — APTT: aPTT: 35 seconds (ref 24–37)

## 2014-09-29 NOTE — Pre-Procedure Instructions (Signed)
    [name]  09/29/2014      [facility] - [place], [place] - [place] [address] Phone: [phone] Fax: [phone]  [place], [place] - [address] [place] [address] Phone: [phone] Fax: [phone]    Your procedure is scheduled on October 01, 2014.  Report to [facility] Admitting at 5:30 A.M.  Call this number if you have problems the morning of surgery:  [phone]    [facility] 8A-4P [phone] FOR QUESTIONS PRIOR TO DAYS OF SURGERY   Remember:  Do not eat food or drink liquids after midnight.  Take these medicines the morning of surgery with A SIP OF WATER :metoprolol tartrate (LOPRESSOR)   STOP ASPIRIN, FISH OIL, HERBAL SUPPLEMENTS, ADVIL, IBUPROFEN AS OF TODAY    Do not wear jewelry, make-up or nail polish.  Do not wear lotions, powders, or perfumes.  You may wear deodorant.  Do not shave 48 hours prior to surgery.    Do not bring valuables to the hospital.  [facility] is not responsible for any belongings or valuables.  Contacts, dentures or bridgework may not be worn into surgery.  Leave your suitcase in the car.  After surgery it may be brought to your room.  For patients admitted to the hospital, discharge time will be determined by your treatment team.  Patients discharged the day of surgery will not be allowed to drive home.   Name and phone number of your driver:    Special instructions:  "PREPARING FOR SURGERY"  Please read over the following fact sheets that you were given. Pain Booklet, Coughing and Deep Breathing, Blood Transfusion Information and Surgical Site Infection Prevention

## 2014-09-30 MED ORDER — VANCOMYCIN HCL IN DEXTROSE 1-5 GM/200ML-% IV SOLN
1000.0000 mg | INTRAVENOUS | Status: AC
  Administered 2014-10-01: 1000 mg via INTRAVENOUS
  Filled 2014-09-30 (×2): qty 200

## 2014-10-01 ENCOUNTER — Inpatient Hospital Stay (HOSPITAL_COMMUNITY): Payer: 59

## 2014-10-01 ENCOUNTER — Inpatient Hospital Stay (HOSPITAL_COMMUNITY): Payer: 59 | Admitting: Certified Registered Nurse Anesthetist

## 2014-10-01 ENCOUNTER — Encounter (HOSPITAL_COMMUNITY): Payer: Self-pay | Admitting: Anesthesiology

## 2014-10-01 ENCOUNTER — Inpatient Hospital Stay (HOSPITAL_COMMUNITY)
Admission: RE | Admit: 2014-10-01 | Discharge: 2014-10-04 | DRG: 168 | Disposition: A | Discharge status: Home / Self Care | Payer: 59 | Source: Ambulatory Visit | Attending: Cardiothoracic Surgery | Admitting: Cardiothoracic Surgery

## 2014-10-01 ENCOUNTER — Encounter (HOSPITAL_COMMUNITY)
Admission: RE | Disposition: A | Discharge status: Home / Self Care | Payer: Self-pay | Source: Ambulatory Visit | Attending: Cardiothoracic Surgery

## 2014-10-01 DIAGNOSIS — I10 Essential (primary) hypertension: Secondary | ICD-10-CM | POA: Diagnosis present

## 2014-10-01 DIAGNOSIS — Z880 Allergy status to penicillin: Secondary | ICD-10-CM | POA: Diagnosis not present

## 2014-10-01 DIAGNOSIS — J9811 Atelectasis: Secondary | ICD-10-CM | POA: Diagnosis not present

## 2014-10-01 DIAGNOSIS — Z01810 Encounter for preprocedural cardiovascular examination: Secondary | ICD-10-CM

## 2014-10-01 DIAGNOSIS — R222 Localized swelling, mass and lump, trunk: Secondary | ICD-10-CM | POA: Diagnosis present

## 2014-10-01 DIAGNOSIS — Z01811 Encounter for preprocedural respiratory examination: Secondary | ICD-10-CM | POA: Diagnosis not present

## 2014-10-01 DIAGNOSIS — J90 Pleural effusion, not elsewhere classified: Secondary | ICD-10-CM | POA: Diagnosis not present

## 2014-10-01 DIAGNOSIS — Z882 Allergy status to sulfonamides status: Secondary | ICD-10-CM | POA: Diagnosis not present

## 2014-10-01 DIAGNOSIS — Z8249 Family history of ischemic heart disease and other diseases of the circulatory system: Secondary | ICD-10-CM | POA: Diagnosis not present

## 2014-10-01 DIAGNOSIS — Z452 Encounter for adjustment and management of vascular access device: Secondary | ICD-10-CM | POA: Diagnosis not present

## 2014-10-01 DIAGNOSIS — Z9889 Other specified postprocedural states: Secondary | ICD-10-CM | POA: Diagnosis not present

## 2014-10-01 DIAGNOSIS — J9859 Other diseases of mediastinum, not elsewhere classified: Secondary | ICD-10-CM | POA: Diagnosis present

## 2014-10-01 DIAGNOSIS — D383 Neoplasm of uncertain behavior of mediastinum: Secondary | ICD-10-CM | POA: Diagnosis not present

## 2014-10-01 DIAGNOSIS — I480 Paroxysmal atrial fibrillation: Secondary | ICD-10-CM | POA: Diagnosis present

## 2014-10-01 DIAGNOSIS — Z951 Presence of aortocoronary bypass graft: Secondary | ICD-10-CM | POA: Diagnosis not present

## 2014-10-01 DIAGNOSIS — I517 Cardiomegaly: Secondary | ICD-10-CM | POA: Diagnosis not present

## 2014-10-01 DIAGNOSIS — Z01812 Encounter for preprocedural laboratory examination: Secondary | ICD-10-CM

## 2014-10-01 DIAGNOSIS — J985 Diseases of mediastinum, not elsewhere classified: Secondary | ICD-10-CM | POA: Diagnosis not present

## 2014-10-01 DIAGNOSIS — Z4889 Encounter for other specified surgical aftercare: Secondary | ICD-10-CM | POA: Diagnosis not present

## 2014-10-01 HISTORY — PX: RESECTION OF MEDIASTINAL MASS: SHX6497

## 2014-10-01 HISTORY — PX: STERNOTOMY: SHX1057

## 2014-10-01 LAB — GLUCOSE, CAPILLARY
Glucose-Capillary: 190 mg/dL — ABNORMAL HIGH (ref 65–99)
Glucose-Capillary: 183 mg/dL — ABNORMAL HIGH (ref 65–99)

## 2014-10-01 SURGERY — EXCISION, MASS, MEDIASTINUM
Anesthesia: General | Site: Chest

## 2014-10-01 MED ORDER — TRAMADOL HCL 50 MG PO TABS: 50.0000 mg | ORAL_TABLET | Freq: Four times a day (QID) | ORAL | Status: DC | PRN

## 2014-10-01 MED ORDER — HYDROMORPHONE HCL 1 MG/ML IJ SOLN
0.5000 mg | INTRAMUSCULAR | Status: DC | PRN
  Administered 2014-10-01 (×2): 0.5 mg via INTRAVENOUS

## 2014-10-01 MED ORDER — METOPROLOL TARTRATE 25 MG PO TABS
25.0000 mg | ORAL_TABLET | Freq: Two times a day (BID) | ORAL | Status: DC
  Administered 2014-10-01 – 2014-10-04 (×6): 25 mg via ORAL
  Filled 2014-10-01 (×7): qty 1

## 2014-10-01 MED ORDER — DEXAMETHASONE SODIUM PHOSPHATE 4 MG/ML IJ SOLN
INTRAMUSCULAR | Status: DC | PRN
  Administered 2014-10-01: 4 mg via INTRAVENOUS

## 2014-10-01 MED ORDER — SODIUM CHLORIDE 0.9 % IJ SOLN
INTRAMUSCULAR | Status: AC
  Filled 2014-10-01: qty 10

## 2014-10-01 MED ORDER — VANCOMYCIN HCL IN DEXTROSE 1-5 GM/200ML-% IV SOLN
1000.0000 mg | Freq: Two times a day (BID) | INTRAVENOUS | Status: AC
  Administered 2014-10-01: 1000 mg via INTRAVENOUS
  Filled 2014-10-01: qty 200

## 2014-10-01 MED ORDER — 0.9 % SODIUM CHLORIDE (POUR BTL) OPTIME
TOPICAL | Status: DC | PRN
  Administered 2014-10-01: 1000 mL

## 2014-10-01 MED ORDER — PROPOFOL 10 MG/ML IV BOLUS
INTRAVENOUS | Status: AC
  Filled 2014-10-01: qty 20

## 2014-10-01 MED ORDER — ONDANSETRON HCL 4 MG/2ML IJ SOLN
4.0000 mg | Freq: Four times a day (QID) | INTRAMUSCULAR | Status: DC | PRN
  Administered 2014-10-02 – 2014-10-04 (×3): 4 mg via INTRAVENOUS
  Filled 2014-10-01 (×4): qty 2

## 2014-10-01 MED ORDER — HYDROCHLOROTHIAZIDE 12.5 MG PO CAPS
12.5000 mg | ORAL_CAPSULE | Freq: Every day | ORAL | Status: DC
  Administered 2014-10-02 – 2014-10-04 (×3): 12.5 mg via ORAL
  Filled 2014-10-01 (×3): qty 1

## 2014-10-01 MED ORDER — OXYCODONE HCL 5 MG PO TABS: 5.0000 mg | ORAL_TABLET | ORAL | Status: DC | PRN

## 2014-10-01 MED ORDER — LEVALBUTEROL HCL 0.63 MG/3ML IN NEBU
0.6300 mg | INHALATION_SOLUTION | Freq: Four times a day (QID) | RESPIRATORY_TRACT | Status: DC
  Administered 2014-10-01 – 2014-10-02 (×3): 0.63 mg via RESPIRATORY_TRACT
  Filled 2014-10-01 (×7): qty 3

## 2014-10-01 MED ORDER — NEOSTIGMINE METHYLSULFATE 10 MG/10ML IV SOLN
INTRAVENOUS | Status: DC | PRN
  Administered 2014-10-01: 4 mg via INTRAVENOUS

## 2014-10-01 MED ORDER — FENTANYL CITRATE (PF) 250 MCG/5ML IJ SOLN
INTRAMUSCULAR | Status: AC
  Filled 2014-10-01: qty 5

## 2014-10-01 MED ORDER — ETOMIDATE 2 MG/ML IV SOLN
INTRAVENOUS | Status: AC
  Filled 2014-10-01: qty 10

## 2014-10-01 MED ORDER — FENTANYL CITRATE (PF) 250 MCG/5ML IJ SOLN
INTRAMUSCULAR | Status: DC | PRN
  Administered 2014-10-01: 100 ug via INTRAVENOUS
  Administered 2014-10-01 (×4): 50 ug via INTRAVENOUS
  Administered 2014-10-01: 100 ug via INTRAVENOUS
  Administered 2014-10-01 (×2): 50 ug via INTRAVENOUS

## 2014-10-01 MED ORDER — EPHEDRINE SULFATE 50 MG/ML IJ SOLN
INTRAMUSCULAR | Status: AC
  Filled 2014-10-01: qty 1

## 2014-10-01 MED ORDER — DEXTROSE-NACL 5-0.45 % IV SOLN
INTRAVENOUS | Status: DC
  Administered 2014-10-01: 13:00:00 via INTRAVENOUS

## 2014-10-01 MED ORDER — ACETAMINOPHEN 160 MG/5ML PO SOLN
1000.0000 mg | Freq: Four times a day (QID) | ORAL | Status: DC
  Filled 2014-10-01: qty 40

## 2014-10-01 MED ORDER — ONDANSETRON HCL 4 MG/2ML IJ SOLN: 4.0000 mg | Freq: Once | INTRAMUSCULAR | Status: DC | PRN

## 2014-10-01 MED ORDER — INSULIN ASPART 100 UNIT/ML ~~LOC~~ SOLN
0.0000 [IU] | Freq: Three times a day (TID) | SUBCUTANEOUS | Status: DC
  Administered 2014-10-01 (×2): 4 [IU] via SUBCUTANEOUS
  Administered 2014-10-02 (×4): 2 [IU] via SUBCUTANEOUS

## 2014-10-01 MED ORDER — ACETAMINOPHEN 500 MG PO TABS
1000.0000 mg | ORAL_TABLET | Freq: Four times a day (QID) | ORAL | Status: DC
  Administered 2014-10-02 – 2014-10-04 (×8): 1000 mg via ORAL
  Filled 2014-10-01 (×16): qty 2

## 2014-10-01 MED ORDER — LISINOPRIL 10 MG PO TABS
10.0000 mg | ORAL_TABLET | Freq: Every day | ORAL | Status: DC
  Administered 2014-10-02 – 2014-10-04 (×3): 10 mg via ORAL
  Filled 2014-10-01 (×3): qty 1

## 2014-10-01 MED ORDER — PHENYLEPHRINE HCL 10 MG/ML IJ SOLN
10.0000 mg | INTRAVENOUS | Status: DC | PRN
  Administered 2014-10-01: 10 ug/min via INTRAVENOUS

## 2014-10-01 MED ORDER — GLYCOPYRROLATE 0.2 MG/ML IJ SOLN
INTRAMUSCULAR | Status: DC | PRN
  Administered 2014-10-01: 0.6 mg via INTRAVENOUS

## 2014-10-01 MED ORDER — FENTANYL 10 MCG/ML IV SOLN
INTRAVENOUS | Status: DC
  Administered 2014-10-01: 60 ug via INTRAVENOUS
  Administered 2014-10-01: 11:00:00 via INTRAVENOUS
  Administered 2014-10-01: 20 ug via INTRAVENOUS
  Administered 2014-10-02: 0 ug via INTRAVENOUS
  Administered 2014-10-02: 40 ug via INTRAVENOUS
  Administered 2014-10-02: 10 ug via INTRAVENOUS
  Administered 2014-10-02: 60 ug via INTRAVENOUS
  Administered 2014-10-03: 0 ug via INTRAVENOUS
  Filled 2014-10-01: qty 50

## 2014-10-01 MED ORDER — LISINOPRIL-HYDROCHLOROTHIAZIDE 10-12.5 MG PO TABS: 1.0000 | ORAL_TABLET | Freq: Every day | ORAL | Status: DC

## 2014-10-01 MED ORDER — FAMOTIDINE 20 MG PO TABS
20.0000 mg | ORAL_TABLET | Freq: Every day | ORAL | Status: DC
  Administered 2014-10-01 – 2014-10-03 (×3): 20 mg via ORAL
  Filled 2014-10-01 (×4): qty 1

## 2014-10-01 MED ORDER — GLYCOPYRROLATE 0.2 MG/ML IJ SOLN
INTRAMUSCULAR | Status: AC
  Filled 2014-10-01: qty 3

## 2014-10-01 MED ORDER — STERILE WATER FOR INJECTION IJ SOLN
INTRAMUSCULAR | Status: AC
  Filled 2014-10-01: qty 10

## 2014-10-01 MED ORDER — LIDOCAINE HCL (CARDIAC) 20 MG/ML IV SOLN
INTRAVENOUS | Status: AC
  Filled 2014-10-01: qty 10

## 2014-10-01 MED ORDER — SODIUM CHLORIDE 0.9 % IJ SOLN
OROMUCOSAL | Status: DC | PRN
  Administered 2014-10-01 (×3): 4 mL via TOPICAL

## 2014-10-01 MED ORDER — MIDAZOLAM HCL 2 MG/2ML IJ SOLN
INTRAMUSCULAR | Status: AC
  Filled 2014-10-01: qty 2

## 2014-10-01 MED ORDER — BISACODYL 5 MG PO TBEC
10.0000 mg | DELAYED_RELEASE_TABLET | Freq: Every day | ORAL | Status: DC
  Administered 2014-10-02 – 2014-10-04 (×3): 10 mg via ORAL
  Filled 2014-10-01 (×3): qty 2

## 2014-10-01 MED ORDER — MIDAZOLAM HCL 5 MG/5ML IJ SOLN
INTRAMUSCULAR | Status: DC | PRN
  Administered 2014-10-01 (×2): 1 mg via INTRAVENOUS

## 2014-10-01 MED ORDER — LIDOCAINE HCL (CARDIAC) 20 MG/ML IV SOLN
INTRAVENOUS | Status: DC | PRN
  Administered 2014-10-01: 50 mg via INTRAVENOUS

## 2014-10-01 MED ORDER — HYDROMORPHONE HCL 1 MG/ML IJ SOLN
INTRAMUSCULAR | Status: AC
  Filled 2014-10-01: qty 1

## 2014-10-01 MED ORDER — NEOSTIGMINE METHYLSULFATE 10 MG/10ML IV SOLN
INTRAVENOUS | Status: AC
  Filled 2014-10-01: qty 1

## 2014-10-01 MED ORDER — ROCURONIUM BROMIDE 50 MG/5ML IV SOLN
INTRAVENOUS | Status: AC
  Filled 2014-10-01: qty 2

## 2014-10-01 MED ORDER — ONDANSETRON HCL 4 MG/2ML IJ SOLN
INTRAMUSCULAR | Status: DC | PRN
  Administered 2014-10-01: 4 mg via INTRAVENOUS

## 2014-10-01 MED ORDER — PROPOFOL 10 MG/ML IV BOLUS
INTRAVENOUS | Status: DC | PRN
  Administered 2014-10-01: 200 mg via INTRAVENOUS

## 2014-10-01 MED ORDER — ROCURONIUM BROMIDE 100 MG/10ML IV SOLN
INTRAVENOUS | Status: DC | PRN
  Administered 2014-10-01: 30 mg via INTRAVENOUS
  Administered 2014-10-01: 20 mg via INTRAVENOUS

## 2014-10-01 MED ORDER — SODIUM CHLORIDE 0.9 % IJ SOLN: 9.0000 mL | INTRAMUSCULAR | Status: DC | PRN

## 2014-10-01 MED ORDER — PHENYLEPHRINE 40 MCG/ML (10ML) SYRINGE FOR IV PUSH (FOR BLOOD PRESSURE SUPPORT)
PREFILLED_SYRINGE | INTRAVENOUS | Status: AC
  Filled 2014-10-01: qty 10

## 2014-10-01 MED ORDER — DIPHENHYDRAMINE HCL 12.5 MG/5ML PO ELIX
12.5000 mg | ORAL_SOLUTION | Freq: Four times a day (QID) | ORAL | Status: DC | PRN
  Filled 2014-10-01: qty 5

## 2014-10-01 MED ORDER — DIPHENHYDRAMINE HCL 50 MG/ML IJ SOLN: 12.5000 mg | Freq: Four times a day (QID) | INTRAMUSCULAR | Status: DC | PRN

## 2014-10-01 MED ORDER — ONDANSETRON HCL 4 MG/2ML IJ SOLN: 4.0000 mg | Freq: Four times a day (QID) | INTRAMUSCULAR | Status: DC | PRN

## 2014-10-01 MED ORDER — POTASSIUM CHLORIDE 10 MEQ/50ML IV SOLN
10.0000 meq | Freq: Every day | INTRAVENOUS | Status: DC | PRN
  Filled 2014-10-01: qty 50

## 2014-10-01 MED ORDER — LACTATED RINGERS IV SOLN
INTRAVENOUS | Status: DC | PRN
  Administered 2014-10-01: 08:00:00 via INTRAVENOUS

## 2014-10-01 MED ORDER — NALOXONE HCL 0.4 MG/ML IJ SOLN: 0.4000 mg | INTRAMUSCULAR | Status: DC | PRN

## 2014-10-01 MED ORDER — HYDRALAZINE HCL 20 MG/ML IJ SOLN
5.0000 mg | INTRAMUSCULAR | Status: DC | PRN
  Administered 2014-10-01: 5 mg via INTRAVENOUS
  Filled 2014-10-01: qty 1

## 2014-10-01 MED ORDER — ZINC SULFATE 220 (50 ZN) MG PO CAPS
220.0000 mg | ORAL_CAPSULE | Freq: Every day | ORAL | Status: DC
  Administered 2014-10-02 – 2014-10-04 (×3): 220 mg via ORAL
  Filled 2014-10-01 (×4): qty 1

## 2014-10-01 MED ORDER — ONDANSETRON HCL 4 MG/2ML IJ SOLN
INTRAMUSCULAR | Status: AC
  Filled 2014-10-01: qty 2

## 2014-10-01 MED ORDER — SENNOSIDES-DOCUSATE SODIUM 8.6-50 MG PO TABS
1.0000 | ORAL_TABLET | Freq: Every day | ORAL | Status: DC
  Administered 2014-10-01: 1 via ORAL
  Filled 2014-10-01 (×4): qty 1

## 2014-10-01 MED ORDER — ASPIRIN EC 81 MG PO TBEC
81.0000 mg | DELAYED_RELEASE_TABLET | Freq: Every day | ORAL | Status: DC
Start: 2014-10-02 — End: 2014-10-04
  Administered 2014-10-02 – 2014-10-03 (×2): 81 mg via ORAL
  Filled 2014-10-01 (×3): qty 1

## 2014-10-01 MED ORDER — METOCLOPRAMIDE HCL 5 MG/ML IJ SOLN
10.0000 mg | Freq: Four times a day (QID) | INTRAMUSCULAR | Status: AC
  Administered 2014-10-01 – 2014-10-02 (×4): 10 mg via INTRAVENOUS
  Filled 2014-10-01 (×4): qty 2

## 2014-10-01 MED ORDER — SUCCINYLCHOLINE CHLORIDE 20 MG/ML IJ SOLN
INTRAMUSCULAR | Status: DC | PRN
  Administered 2014-10-01: 80 mg via INTRAVENOUS

## 2014-10-01 SURGICAL SUPPLY — 61 items
BAG URIMETER BARDEX IC 350 (UROLOGICAL SUPPLIES) ×3 IMPLANT
BATTERY PACK STR FOR DRIVER (MISCELLANEOUS) ×1 IMPLANT
BLADE OSCILLATING /SAGITTAL (BLADE) ×1 IMPLANT
BLADE SURG 11 STRL SS (BLADE) IMPLANT
CANISTER SUCTION 2500CC (MISCELLANEOUS) ×3 IMPLANT
CATH FOLEY 2WAY SLVR  5CC 16FR (CATHETERS) ×1
CATH FOLEY 2WAY SLVR 5CC 16FR (CATHETERS) ×2 IMPLANT
CATH THORACIC 28FR (CATHETERS) IMPLANT
CATH THORACIC 28FR RT ANG (CATHETERS) IMPLANT
CLIP TI MEDIUM 24 (CLIP) ×1 IMPLANT
CLIP TI WIDE RED SMALL 24 (CLIP) ×1 IMPLANT
CONT SPEC 4OZ CLIKSEAL STRL BL (MISCELLANEOUS) ×3 IMPLANT
COVER SURGICAL LIGHT HANDLE (MISCELLANEOUS) ×6 IMPLANT
DRAIN CHANNEL 10M FLAT 3/4 FLT (DRAIN) ×1 IMPLANT
DRAPE LAPAROSCOPIC ABDOMINAL (DRAPES) ×3 IMPLANT
DRILL STERNAL 1.5X10MM (DRILL) ×1 IMPLANT
DRSG AQUACEL AG ADV 3.5X10 (GAUZE/BANDAGES/DRESSINGS) ×1 IMPLANT
DRSG AQUACEL AG ADV 3.5X14 (GAUZE/BANDAGES/DRESSINGS) ×3 IMPLANT
ELECT BLADE 4.0 EZ CLEAN MEGAD (MISCELLANEOUS) ×3
ELECT REM PT RETURN 9FT ADLT (ELECTROSURGICAL) ×3
ELECTRODE BLDE 4.0 EZ CLN MEGD (MISCELLANEOUS) ×2 IMPLANT
ELECTRODE REM PT RTRN 9FT ADLT (ELECTROSURGICAL) ×2 IMPLANT
EVACUATOR SILICONE 100CC (DRAIN) ×1 IMPLANT
GAUZE SPONGE 4X4 12PLY STRL (GAUZE/BANDAGES/DRESSINGS) ×3 IMPLANT
GEL ULTRASOUND 20GR AQUASONIC (MISCELLANEOUS) ×3 IMPLANT
GLOVE BIO SURGEON STRL SZ 6.5 (GLOVE) ×8 IMPLANT
GLOVE BIOGEL PI IND STRL 6.5 (GLOVE) IMPLANT
GLOVE BIOGEL PI IND STRL 7.0 (GLOVE) IMPLANT
GLOVE BIOGEL PI INDICATOR 6.5 (GLOVE) ×5
GLOVE BIOGEL PI INDICATOR 7.0 (GLOVE) ×3
GOWN STRL REUS W/ TWL LRG LVL3 (GOWN DISPOSABLE) ×4 IMPLANT
GOWN STRL REUS W/TWL LRG LVL3 (GOWN DISPOSABLE) ×6
HEMOSTAT POWDER SURGIFOAM 1G (HEMOSTASIS) ×9 IMPLANT
KIT BASIN OR (CUSTOM PROCEDURE TRAY) ×3 IMPLANT
KIT ROOM TURNOVER OR (KITS) ×3 IMPLANT
KIT SUCTION CATH 14FR (SUCTIONS) IMPLANT
NS IRRIG 1000ML POUR BTL (IV SOLUTION) ×6 IMPLANT
PACK CHEST (CUSTOM PROCEDURE TRAY) ×3 IMPLANT
PAD ARMBOARD 7.5X6 YLW CONV (MISCELLANEOUS) ×6 IMPLANT
PLATE 10H STERNAL LOCK WIDE (Plate) ×1 IMPLANT
PLATE LOCKING STUNAL 8HOLE (Plate) ×2 IMPLANT
SCREW SELF DRILL 12MM 3.MM (Screw) ×16 IMPLANT
SPONGE GAUZE 4X4 12PLY STER LF (GAUZE/BANDAGES/DRESSINGS) ×1 IMPLANT
SUT PROLENE 4 0 RB 1 (SUTURE)
SUT PROLENE 4-0 RB1 .5 CRCL 36 (SUTURE) IMPLANT
SUT SILK  1 MH (SUTURE) ×2
SUT SILK 1 MH (SUTURE) IMPLANT
SUT SILK 2 0 SH CR/8 (SUTURE) ×3 IMPLANT
SUT SILK 3 0 (SUTURE) ×3
SUT SILK 3-0 18XBRD TIE 12 (SUTURE) IMPLANT
SUT STEEL 6MS V (SUTURE) ×4 IMPLANT
SUT VIC AB 1 CT1 18XCR BRD 8 (SUTURE) IMPLANT
SUT VIC AB 1 CT1 8-18 (SUTURE)
SUT VIC AB 1 CTX 18 (SUTURE) ×6 IMPLANT
SUT VIC AB 2-0 CTX 36 (SUTURE) ×6 IMPLANT
SUT VIC AB 3-0 X1 27 (SUTURE) ×6 IMPLANT
SYSTEM SAHARA CHEST DRAIN RE-I (WOUND CARE) ×3 IMPLANT
TAPE CLOTH SURG 4X10 WHT LF (GAUZE/BANDAGES/DRESSINGS) ×1 IMPLANT
TOWEL OR 17X24 6PK STRL BLUE (TOWEL DISPOSABLE) ×3 IMPLANT
TOWEL OR 17X26 10 PK STRL BLUE (TOWEL DISPOSABLE) ×6 IMPLANT
WATER STERILE IRR 1000ML POUR (IV SOLUTION) ×3 IMPLANT

## 2014-10-01 NOTE — Progress Notes (Signed)
TCTS BRIEF SICU PROGRESS NOTE  Day of Surgery  S/P Procedure(s) (LRB): RESECTION OF MEDIASTINAL MASS (N/A) STERNOTOMY (N/A)   Resting comfortably Mild soreness in chest NSR w/ stable vitals UOP adequate  Plan: Continue routine early postop  Rexene Alberts 10/01/2014 9:37 PM

## 2014-10-01 NOTE — Anesthesia Procedure Notes (Signed)
Procedure Name: Intubation Date/Time: 10/01/2014 8:07 AM Performed by: Maude Leriche D Pre-anesthesia Checklist: Patient identified, Emergency Drugs available, Suction available, Patient being monitored and Timeout performed Patient Re-evaluated:Patient Re-evaluated prior to inductionOxygen Delivery Method: Circle system utilized Preoxygenation: Pre-oxygenation with 100% oxygen Intubation Type: IV induction Ventilation: Mask ventilation without difficulty Laryngoscope Size: Mac and 3 Grade View: Grade III Tube type: Subglottic suction tube Tube size: 7.5 mm Number of attempts: 2 (One attempt by CRNa with esophageal intubation recognized quickly. Second attempt by NMDa with MAc 3 and blue stylet- 7.5 ETT successfully placed.) Airway Equipment and Method: Bougie stylet and Fiberoptic brochoscope (Disposable broch used for surgeon for surgical (nonairway) assessment of mass) Placement Confirmation: ETT inserted through vocal cords under direct vision,  positive ETCO2 and breath sounds checked- equal and bilateral Secured at: 21 cm Tube secured with: Tape Dental Injury: Teeth and Oropharynx as per pre-operative assessment

## 2014-10-01 NOTE — H&P (Signed)
[address]       [place] [address]             [phone]                    [name] [facility] Medical Record #[id number] Date of Birth: 1940/08/27   Referring: Leta Baptist, MD Primary Care: Deloria Lair, MD  Chief Complaint:  Chief Complaint  Patient presents with  . Mediastinal Mass    Surgical eval, Chest CT 08/27/2014        History of Present Illness:    [name] 73 y.o. female is seen in the office for mediastinal mass. Patient denies any symptoms of breathing or swallowing problems. She noted none painful enlargement of preauricular nodular area on the right. CT of neck was done partially revealing a mediastinal mass. Patient has had no fever or chil, or night sweats. She notes occasional hot flashes. She has had no wt loss or gain.    Patient has had history of intermittent afib in past none recently. She was evaluated for chest pain in oct 2015_ Cardiolite stress test by Dr Harl Bowie Overall impression: positive stress test suggestive of ischemia printed but unsigened results. Since has been seen by cardiology and cleared  Current Activity/ Functional Status:  Patient is independent with mobility/ambulation, transfers, ADL's, IADL's.   Zubrod Score: At the time of surgery this patient's most appropriate activity status/level should be described as: [x]     0    Normal activity, no symptoms []     1    Restricted in physical strenuous activity but ambulatory, able to do out light work []     2    Ambulatory and capable of self care, unable to do work activities, up and about               >50 % of waking hours                              []     3    Only limited self care, in bed greater than 50% of waking hours []     4    Completely disabled, no self care, confined to bed or chair []     5    Moribund   Past Medical History  Diagnosis Date  . Paroxysmal atrial fibrillation     normal echiocardiogram and stress nuclear; low  TSH with normal T3 and T4   . HTN (hypertension)   . 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 Low TSH level 01/2010  . Neuromuscular disorder   . PONV (postoperative nausea and vomiting)     Past Surgical History  Procedure Laterality Date  . Vein ligation and stripping    . Appendectomy    . Cholecystectomy    . Abdominal hysterectomy      w/o oophorectomy  . Bunionectomy      bilateral  . Tonsillectomy    . Tonsillectomy    . Esophagogastroduodenoscopy  12/27/2011    Procedure: ESOPHAGOGASTRODUODENOSCOPY (EGD);  Surgeon: Rogene Houston, MD;  Location: [facility];  Service: Endoscopy;  Laterality: N/A;  250    Family History  Problem Relation Age of Onset  . Heart disease Father   . Hypertension Father   . Diabetes Mother   . Hypertension Mother  father died age 54 with appendicitis , Mother died with hypertension age 80 grandmother had large goiter , one daughter is healthy  History   Social History  . Marital Status: Married    Spouse Name: N/A  . Number of Children: N/A  . Years of Education: N/A   Occupational History  . 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 Smoking status: Never Smoker   . Smokeless tobacco: Never Used     Comment: does not smoke  . Alcohol Use: Yes     Comment: 1 glass of wine at a time just socially  . Drug Use: No  . Sexual Activity: Not on file    Social History Narrative   Married, employment - Network engineer. No caffeine. 8 yr education     History  Smoking status  . Never Smoker   Smokeless tobacco  . Never Used    Comment: does not smoke    History  Alcohol Use  . Yes    Comment: 1 glass of wine at a time just socially     Allergies  Allergen Reactions  . Penicillins Other (See Comments)    Unknown   . Sulfonamide Derivatives Other (See Comments)    Leg Pain    Current Facility-Administered Medications  Medication Dose Route Frequency  Provider Last Rate Last Dose  . vancomycin (VANCOCIN) IVPB 1000 mg/200 mL premix  1,000 mg Intravenous To SS-Surg Grace Isaac, MD          Review of Systems:     Cardiac Review of Systems: Y or N  Chest Pain [not now  ]  Resting SOB [ n  ] Exertional SOB  [ n ]  Orthopnea [  ]   Pedal Edema [n   ]    Palpitations [n  ] Syncope  Florencio.Farrier  ]   Presyncope Florencio.Farrier   ]  General Review of Systems: [Y] = yes [  ]=no Constitional: recent weight change [  ];  Wt loss over the last 3 months [   ] anorexia [  ]; fatigue [  ]; nausea [n  ]; night sweats [ n ]; fever [  ]; or chills [  ];          Dental: poor dentition[n  ]; Last Dentist visit:   Eye : blurred vision [  ]; diplopia [   ]; vision changes [  ];  Amaurosis fugax[ n ]; Resp: cough [n  ];  wheezing[n  ];  hemoptysis[ n ]; shortness of breath[n  ]; paroxysmal nocturnal dyspnea[n  ]; dyspnea on exertion[ n ]; or orthopnea[n  ];  GI:  gallstones[  ], vomiting[  ];  dysphagia[  ]; melena[  ];  hematochezia [  ]; heartburn[  ];   Hx of  Colonoscopy[  ]; GU: kidney stones [  ]; hematuria[  ];   dysuria [  ];  nocturia[  ];  history of     obstruction [  ]; urinary frequency [  ]             Skin: rash, swelling[  ];, hair loss[  ];  peripheral edema[  ];  or itching[  ]; Musculosketetal: myalgias[  ];  joint swelling[ y ];  joint erythema[ n ];  joint pain[y  ];  back pain[  ];  Heme/Lymph: bruising[  ];  bleeding[  ];  anemia[  ];  Neuro: TIA[  ];  headaches[n  ];  stroke[ n ];  vertigo[  ];  seizures[n  ];   paresthesias[  ];  difficulty walking[n  ];  Psych:depression[n]; anxiety[ n ];  Endocrine: diabetes[ y ];  thyroid dysfunction[y  ];  Immunizations: Flu up to date [  y]; Pneumococcal up to date Blue.Reese  ];  Other:  Physical Exam: BP 161/84 mmHg  Pulse 68  Temp(Src) 97.6 F (36.4 C)  Resp 16  SpO2 100%  PHYSICAL EXAMINATION: General appearance: alert, cooperative and appears stated age Head: Normocephalic, without obvious abnormality,  atraumatic Neck: no adenopathy, no carotid bruit, no JVD, supple, symmetrical, trachea midline and thyroid not enlarged, symmetric, no tenderness/mass/nodules Lymph nodes: Cervical, supraclavicular, and axillary nodes normal. Resp: clear to auscultation bilaterally Back: symmetric, no curvature. ROM normal. No CVA tenderness. Cardio: regular rate and rhythm, S1, S2 normal, no murmur, click, rub or gallop and no rub GI: soft, non-tender; bowel sounds normal; no masses,  no organomegaly Extremities: extremities normal, atraumatic, no cyanosis or edema and Homans sign is negative, no sign of DVT Neurologic: Grossly normal Palpable dp and pt pulses  Diagnostic Studies & Laboratory data:     Recent Radiology Findings:  Dg Chest 2 View  09/29/2014   CLINICAL DATA:  Mediastinal mass, preoperative study prior to surgery on July 15th  EXAM: CHEST  2 VIEW  COMPARISON:  CT scan of the chest August 27, 2014 and thoracic spine series dated April 08, 2013  FINDINGS: There is soft tissue fullness in the paratracheal regions bilaterally greater on the left than on the right. This corresponds to the known anterior mediastinal mass from the CT scan of August 27, 2014. The lungs are well-expanded and clear. The heart and pulmonary vascularity are normal. The trachea is midline. There is no pleural effusion or pneumothorax. The thoracic vertebral bodies are preserved in height.  IMPRESSION: 1. Known anterior mediastinal mass. 2. There is no other active cardiopulmonary disease.   Electronically Signed   By: David  Martinique M.D.   On: 09/29/2014 15:57    Ct Soft Tissue Neck W Contrast  08/24/2014   CLINICAL DATA:  Right-sided neck nodules since March 2016.  EXAM: CT NECK WITH CONTRAST  TECHNIQUE: Multidetector CT imaging of the neck was performed using the standard protocol following the bolus administration of intravenous contrast.  CONTRAST:  75 cc Omnipaque 300 intravenous  COMPARISON:  None.  FINDINGS: Pharynx and  larynx: No evidence of mass or inflammatory enhancement.  Salivary glands: There is a lobulated mass within the superficial tail of the right parotid gland measuring up to 19 mm in maximal diameter. No additional salivary gland nodule. Appearance is nonspecific, but not typical of a lymph node. Skullbase foramina are symmetric.  Thyroid: Unremarkable.  Lymph nodes: No adenopathy.  Vascular: Major cervical vessels are patent.  Limited intracranial: Negative  Visualized orbits: Unremarkable  Mastoids and visualized paranasal sinuses: Clear.  Skeleton: Unremarkable for age.  Upper chest: Lobulated mass in the superior mediastinum from the thoracic inlet extending beyond the lower margin of the scan. The mass measures at least 7 x 3.8 x 5.5 cm. Coarse calcifications are intermittently noted. The appearance suggests a thyroid nodule, although there is no continuity with the otherwise normal-appearing thyroid.  The apical lungs are clear.  IMPRESSION: 1. 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 2. Partly visualized anterior mediastinal mass, at least 7 cm. Although the appearance suggests a thyroid nodule, there is no thyroid continuity. Recommend chest  CT follow-up for complete visualization.   Electronically Signed   By: Monte Fantasia M.D.   On: 08/24/2014 18:45   Ct Chest W Contrast  08/27/2014   CLINICAL DATA:  Mediastinal mass on neck CT  EXAM: CT CHEST WITH CONTRAST  TECHNIQUE: Multidetector CT imaging of the chest was performed during intravenous contrast administration.  CONTRAST:  51mL OMNIPAQUE IOHEXOL 300 MG/ML  SOLN  COMPARISON:  CT neck dated 08/24/2014  FINDINGS: Mediastinum/Nodes: 9.6 x 3.8 x 6.1 cm lobulated, enhancing anterior mediastinal mass with scattered calcifications (series 2/ image 22; sagittal image 66). Possible low-density/macroscopic fat within the lesion (series 2/image 15). Although equivocal, a distinct fat plane appears to exits between the mass and the  thyroid gland (series 4/ image 38).  Primary differential consideration is a germ cell tumor. Less likely, benign/malignant thymic neoplasm remains possible.  Exophytic thyroid nodule/neoplasm is considered unlikely. Malignant adenopathy/lymphoma is also considered unlikely.  Visualized thyroid is otherwise unremarkable.  Heart is normal in size.  No pericardial effusion.  Mild atherosclerotic calcifications of the aortic arch.  Small mediastinal lymph nodes, including a 7 mm short axis AP window node and a 7 mm short axis subcarinal node. No suspicious hilar or axillary lymphadenopathy.  Lungs/Pleura: Lungs are essentially clear.  Mild centrilobular and paraseptal emphysematous changes.  No focal consolidation.  No pleural effusion or pneumothorax.  Upper abdomen: Visualized upper abdomen is notable for cholecystectomy clips.  Musculoskeletal: Mild degenerative changes of the visualized thoracolumbar spine.  IMPRESSION: 9.6 x 3.8 x 6.1 cm lobulated, enhancing anterior mediastinal mass, as described above. Primary differential consideration is a mediastinal germ cell tumor. Surgical consultation is suggested.   Electronically Signed   By: Julian Hy M.D.   On: 08/27/2014 17:55     I have independently reviewed the above radiology studies  and reviewed the findings with the patient.   Recent Lab Findings: Lab Results  Component Value Date   WBC 6.1 09/29/2014   HGB 13.5 09/29/2014   HCT 38.4 09/29/2014   PLT 213 09/29/2014   GLUCOSE 101* 09/29/2014   CHOL  09/08/2007    142        ATP III CLASSIFICATION:  <200     mg/dL   Desirable  200-239  mg/dL   Borderline High  >=240    mg/dL   High   TRIG 215* 09/08/2007   HDL 37* 09/08/2007   LDLCALC  09/08/2007    62        Total Cholesterol/HDL:CHD Risk Coronary Heart Disease Risk Table                     Men   Women  1/2 Average Risk   3.4   3.3   ALT 21 09/29/2014   AST 20 09/29/2014   NA 132* 09/29/2014   K 3.4* 09/29/2014   CL 100*  09/29/2014   CREATININE 0.71 09/29/2014   BUN 11 09/29/2014   CO2 20* 09/29/2014   TSH 0.345* 09/01/2010   INR 1.03 09/29/2014   HGBA1C  09/07/2007    5.9 (NOTE)   The ADA recommends the following therapeutic goals for glycemic   control related to Hgb A1C measurement:   Goal of Therapy:   < 7.0% Hgb A1C   Action Suggested:  > 8.0% Hgb A1C   Ref:  Diabetes Care, 22, Suppl. 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 Abnormal stress test- Seen by  Dr Harl Bowie  for pre op cardiology clearance      I have reviewed the scans with the patient and her husband and have recommended proceeding with surgical resection both for treatment and DX. Patient is agreeable.   The goals risks and alternatives of the planned surgical procedure sternotomy and resection of mediastinal mass have been discussed with the patient in detail. The risks of the procedure including death, infection, stroke, myocardial infarction, bleeding, blood transfusion have all been discussed specifically.  I have quoted [name] a 3 % of perioperative mortality and a complication rate as high as 25 %. The patient's questions have been answered.[name] is willing  to proceed with the planned procedure.  Grace Isaac MD      [address] [place] [address] Office [phone]   Beeper [phone]  10/01/2014 7:09 AM

## 2014-10-01 NOTE — Progress Notes (Signed)
Waiting on xray. They have been called twice.

## 2014-10-01 NOTE — Transfer of Care (Signed)
Immediate Anesthesia Transfer of Care Note  Patient: [name]  Procedure(s) Performed: Procedure(s): RESECTION OF MEDIASTINAL MASS (N/A) STERNOTOMY (N/A)  Patient Location: PACU  Anesthesia Type:General  Level of Consciousness: sedated  Airway & Oxygen Therapy: Patient Spontanous Breathing and Patient connected to face mask oxygen  Post-op Assessment: Report given to RN and Post -op Vital signs reviewed and stable  Post vital signs: Reviewed and stable  Last Vitals:  Filed Vitals:   10/01/14 0550  BP: 161/84  Pulse: 68  Temp: 36.4 C  Resp: 16    Complications: No apparent anesthesia complications

## 2014-10-01 NOTE — Progress Notes (Signed)
Dr.Smith and Dr.Gerhardt at bedside and aware. No new orders

## 2014-10-01 NOTE — Brief Op Note (Addendum)
10/01/2014  10:19 AM  PATIENT:  [name]  74 y.o. female  PRE-OPERATIVE DIAGNOSIS:  MEDIASTINAL MASS  POST-OPERATIVE DIAGNOSIS:  MEDIASTINAL MASS- thyroid   PROCEDURE:  Procedure(s):  RESECTION OF MEDIASTINAL MASS (N/A)   PARTIAL STERNOTOMY (N/A) with plating of sternum  SURGEON:  Surgeon(s) and Role:    * Grace Isaac, MD - Primary  PHYSICIAN ASSISTANT: Erin [name] PA-C  ANESTHESIA:   general  EBL:  Total I/O In: 1000 [I.V.:1000] Out: 550 [Urine:550]  BLOOD ADMINISTERED:none  DRAINS: Flat drain in Mediastinum   LOCAL MEDICATIONS USED:  NONE  SPECIMEN:  Source of Specimen:  mediastinal mass  DISPOSITION OF SPECIMEN:  PATHOLOGY  COUNTS:  YES   DICTATION: .Dragon Dictation  PLAN OF CARE: Admit to inpatient   PATIENT DISPOSITION:  ICU - extubated and stable.   Delay start of Pharmacological VTE agent (>24hrs) due to surgical blood loss or risk of bleeding: yes

## 2014-10-01 NOTE — Anesthesia Postprocedure Evaluation (Signed)
  Anesthesia Post-op Note  Patient: [name]  Procedure(s) Performed: Procedure(s): RESECTION OF MEDIASTINAL MASS (N/A) STERNOTOMY (N/A)  Patient Location: PACU  Anesthesia Type:General  Level of Consciousness: awake, oriented, sedated and patient cooperative  Airway and Oxygen Therapy: Patient Spontanous Breathing  Post-op Pain: mild, moderate  Post-op Assessment: Post-op Vital signs reviewed, Patient's Cardiovascular Status Stable, Respiratory Function Stable, Patent Airway, No signs of Nausea or vomiting and Pain level controlled              Post-op Vital Signs: stable  Last Vitals:  Filed Vitals:   10/01/14 0550  BP: 161/84  Pulse: 68  Temp: 36.4 C  Resp: 16    Complications: No apparent anesthesia complications

## 2014-10-01 NOTE — Anesthesia Preprocedure Evaluation (Signed)
Anesthesia Evaluation  Patient identified by MRN, date of birth, ID band Patient awake    Reviewed: Allergy & Precautions, NPO status , Patient's Chart, lab work & pertinent test results  History of Anesthesia Complications (+) PONV  Airway Mallampati: I       Dental   Pulmonary    Pulmonary exam normal       Cardiovascular hypertension, + dysrhythmias Atrial Fibrillation Rhythm:Irregular Rate:Abnormal     Neuro/Psych  Neuromuscular disease    GI/Hepatic GERD-  ,  Endo/Other    Renal/GU      Musculoskeletal  (+) Arthritis -,   Abdominal   Peds  Hematology   Anesthesia Other Findings   Reproductive/Obstetrics                             Anesthesia Physical Anesthesia Plan  ASA: III  Anesthesia Plan: General   Post-op Pain Management:    Induction: Intravenous  Airway Management Planned: Oral ETT  Additional Equipment:   Intra-op Plan:   Post-operative Plan: Possible Post-op intubation/ventilation  Informed Consent: I have reviewed the patients History and Physical, chart, labs and discussed the procedure including the risks, benefits and alternatives for the proposed anesthesia with the patient or authorized representative who has indicated his/her understanding and acceptance.     Plan Discussed with: CRNA, Anesthesiologist and Surgeon  Anesthesia Plan Comments:         Anesthesia Quick Evaluation

## 2014-10-02 ENCOUNTER — Inpatient Hospital Stay (HOSPITAL_COMMUNITY): Payer: 59

## 2014-10-02 LAB — CBC
HEMATOCRIT: 32.9 % — AB (ref 36.0–46.0)
Hemoglobin: 11.6 g/dL — ABNORMAL LOW (ref 12.0–15.0)
MCH: 31.3 pg (ref 26.0–34.0)
MCHC: 35.3 g/dL (ref 30.0–36.0)
MCV: 88.7 fL (ref 78.0–100.0)
PLATELETS: 175 10*3/uL (ref 150–400)
RBC: 3.71 MIL/uL — AB (ref 3.87–5.11)
RDW: 12.9 % (ref 11.5–15.5)
WBC: 13.7 10*3/uL — ABNORMAL HIGH (ref 4.0–10.5)

## 2014-10-02 LAB — GLUCOSE, CAPILLARY
Glucose-Capillary: 122 mg/dL — ABNORMAL HIGH (ref 65–99)
Glucose-Capillary: 137 mg/dL — ABNORMAL HIGH (ref 65–99)
Glucose-Capillary: 120 mg/dL — ABNORMAL HIGH (ref 65–99)
Glucose-Capillary: 134 mg/dL — ABNORMAL HIGH (ref 65–99)

## 2014-10-02 LAB — POCT I-STAT 3, ART BLOOD GAS (G3+)
Acid-base deficit: 1 mmol/L (ref 0.0–2.0)
Bicarbonate: 22.6 mEq/L (ref 20.0–24.0)
O2 Saturation: 94 %
Patient temperature: 99
TCO2: 24 mmol/L (ref 0–100)
pCO2 arterial: 33 mmHg — ABNORMAL LOW (ref 35.0–45.0)
pH, Arterial: 7.445 (ref 7.350–7.450)
pO2, Arterial: 67 mmHg — ABNORMAL LOW (ref 80.0–100.0)

## 2014-10-02 LAB — BASIC METABOLIC PANEL
ANION GAP: 9 (ref 5–15)
BUN: 10 mg/dL (ref 6–20)
CO2: 23 mmol/L (ref 22–32)
CREATININE: 0.68 mg/dL (ref 0.44–1.00)
Calcium: 8 mg/dL — ABNORMAL LOW (ref 8.9–10.3)
Chloride: 97 mmol/L — ABNORMAL LOW (ref 101–111)
GFR calc Af Amer: >60 mL/min (ref >60)
GFR calc non Af Amer: >60 mL/min (ref >60)
Glucose, Bld: 155 mg/dL — ABNORMAL HIGH (ref 65–99)
POTASSIUM: 2.9 mmol/L — AB (ref 3.5–5.1)
SODIUM: 129 mmol/L — AB (ref 135–145)

## 2014-10-02 MED ORDER — LEVALBUTEROL HCL 0.63 MG/3ML IN NEBU: 0.6300 mg | INHALATION_SOLUTION | Freq: Four times a day (QID) | RESPIRATORY_TRACT | Status: DC | PRN

## 2014-10-02 MED ORDER — POTASSIUM CHLORIDE 10 MEQ/50ML IV SOLN
10.0000 meq | INTRAVENOUS | Status: AC
  Administered 2014-10-02 (×3): 10 meq via INTRAVENOUS
  Filled 2014-10-02 (×2): qty 50

## 2014-10-02 MED ORDER — POTASSIUM CHLORIDE 10 MEQ/50ML IV SOLN
10.0000 meq | INTRAVENOUS | Status: AC
  Administered 2014-10-02 (×3): 10 meq via INTRAVENOUS
  Filled 2014-10-02 (×3): qty 50

## 2014-10-02 NOTE — Progress Notes (Signed)
TCTS BRIEF SICU PROGRESS NOTE  1 Day Post-Op  S/P Procedure(s) (LRB): RESECTION OF MEDIASTINAL MASS (N/A) STERNOTOMY (N/A)   Stable day  Plan: Continue current plan.  Waiting for bed on stepdown unit  [name] 10/02/2014 6:28 PM

## 2014-10-02 NOTE — Progress Notes (Signed)
Utilization Review Completed.  

## 2014-10-02 NOTE — Progress Notes (Signed)
Pt arrived to [place], vitals stable. Pt complaining of nausea; pt given peppermint aromatherapy. Will continue to monitor.

## 2014-10-02 NOTE — Progress Notes (Addendum)
      [address]       [place] [address]             [phone]        CARDIOTHORACIC SURGERY PROGRESS NOTE   R1 Day Post-Op Procedure(s) (LRB): RESECTION OF MEDIASTINAL MASS (N/A) STERNOTOMY (N/A)  Subjective: Looks good.  Mild soreness in chest but o/w feels well  Objective: Vital signs: BP Readings from Last 1 Encounters:  10/02/14 124/74   Pulse Readings from Last 1 Encounters:  10/02/14 87   Resp Readings from Last 1 Encounters:  10/02/14 15   Temp Readings from Last 1 Encounters:  10/02/14 97.7 F (36.5 C) Oral    Hemodynamics:    Physical Exam:  Rhythm:   sinus  Breath sounds: clear  Heart sounds:  RRR  Incisions:  Dressing dry, intact  Abdomen:  Soft, non-distended, non-tender  Extremities:  Warm, well-perfused    Intake/Output from previous day: 07/15 0701 - 07/16 0700 In: 3400 [P.O.:200; I.V.:2950; IV Piggyback:250] Out: 1975 [FBPZW:2585; Drains:130] Intake/Output this shift: Total I/O In: 580 [P.O.:480; IV Piggyback:100] Out: -   Lab Results:  CBC: Recent Labs  09/29/14 1434 10/02/14 0419  WBC 6.1 13.7*  HGB 13.5 11.6*  HCT 38.4 32.9*  PLT 213 175    BMET:  Recent Labs  09/29/14 1434 10/02/14 0419  NA 132* 129*  K 3.4* 2.9*  CL 100* 97*  CO2 20* 23  GLUCOSE 101* 155*  BUN 11 10  CREATININE 0.71 0.68  CALCIUM 9.3 8.0*     PT/INR:   Recent Labs  09/29/14 1434  LABPROT 13.7  INR 1.03    CBG (last 3)   Recent Labs  10/01/14 1701 10/01/14 2155 10/02/14 0803  GLUCAP 190* 183* 122*    ABG    Component Value Date/Time   PHART 7.445 10/02/2014 0423   PCO2ART 33.0* 10/02/2014 0423   PO2ART 67.0* 10/02/2014 0423   HCO3 22.6 10/02/2014 0423   TCO2 24 10/02/2014 0423   ACIDBASEDEF 1.0 10/02/2014 0423   O2SAT 94.0 10/02/2014 0423    CXR: PORTABLE CHEST - 1 VIEW  COMPARISON: 10/01/2014  FINDINGS: Stable surgical drain noted along the central mediastinum. There is no mediastinal  widening. Cardiac silhouette is normal in size. Minor linear subsegmental atelectasis at the lung bases. Lungs otherwise clear. No pleural effusion or pneumothorax.  Right internal jugular central venous line is stable and well positioned.  IMPRESSION: 1. No acute findings. No evidence of an operative complication. 2. Mild subsegmental atelectasis at the lung bases. No pneumothorax or mediastinal widening.   Electronically Signed  By: Lajean Manes M.D.  On: 10/02/2014 07:28  Assessment/Plan: S/P Procedure(s) (LRB): RESECTION OF MEDIASTINAL MASS (N/A) STERNOTOMY (N/A)  Doing well POD1 Expected post op acute blood loss anemia, mild Expected post op atelectasis, mild H/O paroxysmal Afib pre-op   Mobilize  D/C drain  Decrease IV fluids  Transfer stepdown   Rexene Alberts 10/02/2014 10:43 AM

## 2014-10-03 LAB — CBC
HEMATOCRIT: 33.7 % — AB (ref 36.0–46.0)
Hemoglobin: 11.9 g/dL — ABNORMAL LOW (ref 12.0–15.0)
MCH: 31.2 pg (ref 26.0–34.0)
MCHC: 35.3 g/dL (ref 30.0–36.0)
MCV: 88.5 fL (ref 78.0–100.0)
Platelets: 161 10*3/uL (ref 150–400)
RBC: 3.81 MIL/uL — ABNORMAL LOW (ref 3.87–5.11)
RDW: 12.5 % (ref 11.5–15.5)
WBC: 13.4 10*3/uL — ABNORMAL HIGH (ref 4.0–10.5)

## 2014-10-03 LAB — COMPREHENSIVE METABOLIC PANEL
ALT: 44 U/L (ref 14–54)
AST: 27 U/L (ref 15–41)
Albumin: 2.7 g/dL — ABNORMAL LOW (ref 3.5–5.0)
Alkaline Phosphatase: 47 U/L (ref 38–126)
Anion gap: 5 (ref 5–15)
BUN: 8 mg/dL (ref 6–20)
CO2: 27 mmol/L (ref 22–32)
Calcium: 8.3 mg/dL — ABNORMAL LOW (ref 8.9–10.3)
Chloride: 95 mmol/L — ABNORMAL LOW (ref 101–111)
Creatinine, Ser: 0.66 mg/dL (ref 0.44–1.00)
GFR calc Af Amer: >60 mL/min (ref >60)
GFR calc non Af Amer: >60 mL/min (ref >60)
Glucose, Bld: 117 mg/dL — ABNORMAL HIGH (ref 65–99)
Potassium: 3.5 mmol/L (ref 3.5–5.1)
Sodium: 127 mmol/L — ABNORMAL LOW (ref 135–145)
Total Bilirubin: 1.4 mg/dL — ABNORMAL HIGH (ref 0.3–1.2)
Total Protein: 6.1 g/dL — ABNORMAL LOW (ref 6.5–8.1)

## 2014-10-03 LAB — GLUCOSE, CAPILLARY: Glucose-Capillary: 117 mg/dL — ABNORMAL HIGH (ref 65–99)

## 2014-10-03 MED ORDER — PROMETHAZINE HCL 25 MG/ML IJ SOLN: 6.2500 mg | Freq: Four times a day (QID) | INTRAMUSCULAR | Status: DC | PRN

## 2014-10-03 NOTE — Discharge Summary (Signed)
Physician Discharge Summary  Patient ID: [name] MRN: [id number] DOB/AGE: 1941-01-26 74 y.o.  Admit date: 10/01/2014 Discharge date: 10/04/2014  Admission Diagnoses:  Patient Active Problem List   Diagnosis Date Noted  . 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 Irritable bowel syndrome 06/30/2012  . GERD (gastroesophageal reflux disease) 12/05/2011  . Lumbar spondylosis 07/09/2011  . 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 Disorders of sacrum 05/15/2011  . Paroxysmal atrial fibrillation   . DJD (degenerative joint disease)   . Low TSH level 01/17/2010  . Hypertension 07/27/2009  . PLANTAR FACIITIS 07/22/2007   Discharge Diagnoses:   Patient Active Problem List   Diagnosis Date Noted  . 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 Irritable bowel syndrome 06/30/2012  . GERD (gastroesophageal reflux disease) 12/05/2011  . Lumbar spondylosis 07/09/2011  . 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 Disorders of sacrum 05/15/2011  . Paroxysmal atrial fibrillation   . DJD (degenerative joint disease)   . Low TSH level 01/17/2010  . Hypertension 07/27/2009  . PLANTAR FACIITIS 07/22/2007  UTI  Discharged Condition: good  History of Present Illness:  [name] is a 74 yo white female referred to [facility] for evaluation of a Mediastinal mass.  She was undergoing workup for a known neck mass, when CT scan revealed the mass in the mediastinum.  The patient denies any symptoms incluiding shortness of breath, dysphagia, lymph node enlargement, fevers, chills or night sweats.  The patient has also not suffered and weight loss or gain.  She has a history of intermittent Atrial Fibrillation, but no occurrences in recent months.  She was evaluated by Cardiology in 2015 for chest pain.  Stress test was suggestive of ischemia.  She has since been seen and cleared by Cardiology.  She was evaluated by Dr. Servando Snare, at which time he felt patient  should undergo surgical resection for treatment and diagnostic purposes.  The risks and benefits of the procedure were explained to the patient and her husband and she was agreeable to proceed.     Hospital Course:   She presented to [facility] on 10/11/2014.  She was taken to the operating room and underwent Partial Sternotomy with resection of mediastinal mass.  She tolerated the procedure without difficulty, was extubated and taken to the SICU in stable condition.  During her stay in the SICU the patient progressed without issues.  Her mediastinal drain was removed without difficulty.  She was felt stable for transfer to the step down unit in stable condition.  The patient has developed nausea which responded to anti emetics.  Despite this she is tolerating a regular diet.  She has been maintaining NSR and her home antihypertensives and beta blockers have been resumed.  She is ambulating without difficulty.  She has had intermittent nausea (no abdominal pain or emesis). Oxy has been stopped. She feels better with less nausea today.She did have foul smelling urine and blood in her urine on 07/18. Microscopic UA showed  Few bacteria but TNTC WBC and RBC. UA showed large amount of leukocytes. She was put on Cipro. She was felt surgically stable for discharge today.  Significant Diagnostic Studies: radiology: CT scan:   9.6 x 3.8 x 6.1 cm lobulated, enhancing anterior mediastinal mass, as described above. Primary differential consideration is a mediastinal germ cell tumor. Surgical consultation is suggested.  Treatments: surgery:   RESECTION OF MEDIASTINAL MASS (N/A)  PARTIAL STERNOTOMY (N/A) with plating of sternum  Disposition: 01-Home or  Self Care   Discharge Medications:       Medication List    TAKE these medications        aspirin 81 MG EC tablet  Take 81 mg by mouth at bedtime.     ciprofloxacin 500 MG tablet  Commonly known as:  CIPRO  Take 1 tablet (500 mg total)  by mouth 2 (two) times daily.     fish oil-omega-3 fatty acids 1000 MG capsule  Take 1 g by mouth daily.     HAIR/SKIN/NAILS PO  Take 1 tablet by mouth daily.     lisinopril-hydrochlorothiazide 10-12.5 MG per tablet  Commonly known as:  PRINZIDE,ZESTORETIC  Take 1 tablet by mouth daily.     metoprolol tartrate 25 MG tablet  Commonly known as:  LOPRESSOR  Take 1 tablet (25 mg total) by mouth 2 (two) times daily.     ranitidine 150 MG tablet  Commonly known as:  ZANTAC  Take 150 mg by mouth at bedtime.     traMADol 50 MG tablet  Commonly known as:  ULTRAM  Take 1 tablet (50 mg total) by mouth every 6 (six) hours as needed (mild pain).     zinc gluconate 50 MG tablet  Take 50 mg by mouth daily.       Follow-up Information    Follow up with Grace Isaac, MD.   Specialty:  Cardiothoracic Surgery   Why:  PA/LAT CXR to be taken (at [facility] which is in the same building as Dr. Everrett Coombe office) one hour prior to office appointment. Office will mail appointment date and time   Contact information:   [address] [place] [phone]       Signed: Nani Skillern PA-C 10/04/2014, 9:37 AM

## 2014-10-03 NOTE — Discharge Instructions (Signed)
1. Patient may shower 2. Avoid heavy lifting over 8 lbs with upper extremities for 6 weeks 3. No Driving 4. Resume home diet

## 2014-10-03 NOTE — Progress Notes (Addendum)
      [address]       ,[place] [address]             [phone]      2 Days Post-Op Procedure(s) (LRB): RESECTION OF MEDIASTINAL MASS (N/A) STERNOTOMY (N/A)   Subjective:  [name] has some nausea this morning.  She states that she thinks if she had food in her stomach this wouldn't be bothering her.  She has not vomited.  Objective: Vital signs in last 24 hours: Temp:  [97.4 F (36.3 C)-98.7 F (37.1 C)] 98.1 F (36.7 C) (07/17 0319) Pulse Rate:  [71-90] 81 (07/17 0805) Cardiac Rhythm:  [-] Normal sinus rhythm (07/17 0809) Resp:  [13-25] 13 (07/17 0805) BP: (131-158)/(71-88) 150/79 mmHg (07/17 0805) SpO2:  [95 %-100 %] 97 % (07/17 0805)  Intake/Output from previous day: 07/16 0701 - 07/17 0700 In: 1710 [P.O.:960; I.V.:500; IV Piggyback:250] Out: 1640 [Urine:1630; Drains:10] Intake/Output this shift: Total I/O In: 130 [P.O.:120; I.V.:10] Out: -   General appearance: alert, cooperative and no distress Heart: regular rate and rhythm Lungs: clear to auscultation bilaterally Abdomen: soft, non-tender; bowel sounds normal; no masses,  no organomegaly Wound: clean and dry  Lab Results:  Recent Labs  10/02/14 0419 10/03/14 0420  WBC 13.7* 13.4*  HGB 11.6* 11.9*  HCT 32.9* 33.7*  PLT 175 161   BMET:  Recent Labs  10/02/14 0419 10/03/14 0420  NA 129* 127*  K 2.9* 3.5  CL 97* 95*  CO2 23 27  GLUCOSE 155* 117*  BUN 10 8  CREATININE 0.68 0.66  CALCIUM 8.0* 8.3*    PT/INR: No results for input(s): LABPROT, INR in the last 72 hours. ABG    Component Value Date/Time   PHART 7.445 10/02/2014 0423   HCO3 22.6 10/02/2014 0423   TCO2 24 10/02/2014 0423   ACIDBASEDEF 1.0 10/02/2014 0423   O2SAT 94.0 10/02/2014 0423   CBG (last 3)   Recent Labs  10/02/14 1631 10/02/14 2233 10/03/14 0834  GLUCAP 120* 134* 117*    Assessment/Plan: S/P Procedure(s) (LRB): RESECTION OF MEDIASTINAL MASS (N/A) STERNOTOMY (N/A)  1. CV-  +hypertension- continue Lisinopril, HCTZ, lopressor 2. Pulm- not on oxygen, continue IS 3. GI- + nausea, diet advanced per patient's request, has moved bowels x 2 since surgery 4. Dispo- patient doing well, diet as tolerated, will d/c PCA, possibly home in AM if nausea resolves   LOS: 2 days    [name] 10/03/2014  I have seen and examined the patient and agree with the assessment and plan as outlined.  Will try to advance diet.  Possible d/c home 1-2 days  Rexene Alberts 10/03/2014 11:27 AM

## 2014-10-03 NOTE — Progress Notes (Signed)
24 ml's of fentanyl wasted with Pecolia Ades as second RN.

## 2014-10-04 ENCOUNTER — Inpatient Hospital Stay (HOSPITAL_COMMUNITY): Payer: 59

## 2014-10-04 ENCOUNTER — Encounter (HOSPITAL_COMMUNITY): Payer: Self-pay | Admitting: Cardiothoracic Surgery

## 2014-10-04 LAB — URINALYSIS, ROUTINE W REFLEX MICROSCOPIC
Glucose, UA: NEGATIVE mg/dL
Ketones, ur: 40 mg/dL — AB
Nitrite: POSITIVE — AB
Protein, ur: 100 mg/dL — AB
Specific Gravity, Urine: 1.015 (ref 1.005–1.030)
Urobilinogen, UA: 1 mg/dL (ref 0.0–1.0)
pH: 6.5 (ref 5.0–8.0)

## 2014-10-04 LAB — URINE MICROSCOPIC-ADD ON

## 2014-10-04 MED ORDER — CIPROFLOXACIN HCL 500 MG PO TABS
500.0000 mg | ORAL_TABLET | Freq: Two times a day (BID) | ORAL | Status: DC
  Administered 2014-10-04: 500 mg via ORAL
  Filled 2014-10-04 (×3): qty 1

## 2014-10-04 MED ORDER — TRAMADOL HCL 50 MG PO TABS: 50.0000 mg | ORAL_TABLET | Freq: Four times a day (QID) | ORAL | Status: DC | PRN

## 2014-10-04 MED ORDER — CIPROFLOXACIN HCL 500 MG PO TABS: 500.0000 mg | ORAL_TABLET | Freq: Two times a day (BID) | ORAL | Status: DC

## 2014-10-04 MED ORDER — OXYCODONE HCL 5 MG PO TABS: 5.0000 mg | ORAL_TABLET | ORAL | Status: DC | PRN

## 2014-10-04 NOTE — Progress Notes (Signed)
Pt c/o urgency with urination, denies burning.  Urine noted to be hematuric.  Will send clean catch urine for UA with next void.

## 2014-10-04 NOTE — Progress Notes (Addendum)
      [address]       [place] [address]             [phone]       3 Days Post-Op Procedure(s) (LRB): RESECTION OF MEDIASTINAL MASS (N/A) STERNOTOMY (N/A)  Subjective: Patient eating breakfast. Only complaint is foul smelling bloody like urine and occasional nausea.  Objective: Vital signs in last 24 hours: Temp:  [98.2 F (36.8 C)-99 F (37.2 C)] 98.2 F (36.8 C) (07/18 0320) Pulse Rate:  [78-105] 81 (07/18 0320) Cardiac Rhythm:  [-] Normal sinus rhythm (07/18 0320) Resp:  [10-20] 15 (07/18 0320) BP: (115-150)/(66-89) 135/76 mmHg (07/18 0320) SpO2:  [92 %-95 %] 95 % (07/18 0320)     Intake/Output from previous day: 07/17 0701 - 07/18 0700 In: 610 [P.O.:600; I.V.:10] Out: 2280 [Urine:2280]   Physical Exam:  Cardiovascular: RRR Pulmonary: Clear to auscultation bilaterally; no rales, wheezes, or rhonchi. Abdomen: Soft, non tender, bowel sounds present. Wounds: Clean and dry.  No erythema or signs of infection.   Lab Results: CBC: Recent Labs  10/02/14 0419 10/03/14 0420  WBC 13.7* 13.4*  HGB 11.6* 11.9*  HCT 32.9* 33.7*  PLT 175 161   BMET:  Recent Labs  10/02/14 0419 10/03/14 0420  NA 129* 127*  K 2.9* 3.5  CL 97* 95*  CO2 23 27  GLUCOSE 155* 117*  BUN 10 8  CREATININE 0.68 0.66  CALCIUM 8.0* 8.3*    PT/INR: No results for input(s): LABPROT, INR in the last 72 hours. ABG:  INR: Will add last result for INR, ABG once components are confirmed Will add last 4 CBG results once components are confirmed  Assessment/Plan:  1. CV - SR in the 90's. On Toprol 25 mg bid, Lisinopril 10 mg daily, and HCTZ 12.5 mg daily.  2.  Pulmonary - On room air. CXR this am shows no pneumothorax, mild bibasilar atelectasis. Await final path of mediastinal mass 3. Anemia-last H and H stable at 11.9 and 33.7 4. Await UA as had blood and foul smelling urine 5. Remove central line 6. Stop Oxy. Monitor nausea 7. Possibly discharge later  today  Graceanna Theissen MPA-C 10/04/2014,8:20 AM

## 2014-10-04 NOTE — Op Note (Signed)
NAMEHASANA, ALCORTA NO.:  [id number]  MEDICAL RECORD NO.:  [id number]  LOCATION:  3S15C                        FACILITY:  [facility]  PHYSICIAN:  Lanelle Bal, MD    DATE OF BIRTH:  Dec 19, 1940  DATE OF PROCEDURE:  10/01/2014 DATE OF DISCHARGE:                              OPERATIVE REPORT   PREOPERATIVE DIAGNOSIS:  Large anterior mediastinal mass.  POSTOPERATIVE DIAGNOSIS:  Large anterior mediastinal mass.  SURGICAL PROCEDURE:  Partial sternotomy with excision of mediastinal mass and sternal plating.  SURGEON:  Lanelle Bal, MD.  FIRST ASSISTANT:  Providence Crosby, PA.  BRIEF HISTORY:  The patient is a 74 year old female who noted a firm preauricular mass in her neck.  A CT scan of the neck was performed which revealed a large mediastinal mass 7 x 9 cm that was not homogeneous, suggestive of a thyroid goiter, but lymphoma or germ cell tumor could not be completely ruled out.  The mass appeared to not be abutting the pulmonary artery, but not invading the cardiac structures. A beta-HCG and alpha-fetoprotein levels were normal.  The mass did not connect to the structures in the neck, specifically the thyroid. Because of the size of the mass compressing vital structures, excision was recommended.  The patient agreed and signed informed consent.  DESCRIPTION OF PROCEDURE:  The patient underwent general endotracheal anesthesia without incident.  Through a single-lumen endotracheal tube, a fiberoptic bronchoscope was passed to the subsegmental level.  The endotracheal tube was in good position.  There were no endobronchial lesions appreciated and the airway did not appear to be compromised by the lesion.  The scope was removed.  The neck and chest were prepped with Betadine and draped in sterile manner. An upper sternotomy incision was performed to just below the manubrium and carried down to the bone. A partial sternotomy to approximately a 3rd intercostal  space was performed and then T'd to the left side.  A laminar spreader and Tuffier retractors were then placed.  This gave good visualization of a multilobular gelatinous mass in the anterior mediastinum with significant blood supply over the surface of it.  We carefully dissected along the mass.  The mass was taken out in its entirety and consisted of 2 separate lobules.  It was not invasive into the surrounding tissue or compromise the pericardium.  As noted on the CT scan, the mass was approximately 9 cm x 7 cm.  The initial lobular area was removed and sent for frozen section and revealed thyroid tissue.  With the specimen totally removed, a flat Blake drain was left in the upper mediastinum and brought out superiorly and secured in place.  We then used 3 sternal plates, the top most was a star-shaped plate, #12 screws were used for all 3 plates and placed one single sternal wire which held the sternum together as the top plate was placed to secure the manubrium.  The second and third plates were straight plates across the sternum and on to the associated rib.  This gave good stability to the sternal closure. Fascia was closed with interrupted 0 Vicryl, running 3-0 Vicryl in subcutaneous tissue and 3-0 subcuticular stitch in skin edges.  Dry dressings were applied.  At the completion of procedure, sponge and needle count was reported as correct.  Estimated blood loss approximately 100 mL.  The patient tolerated the procedure without obvious complication.  She was extubated in the operating room and transferred to the recovery room for postoperative observation.     Lanelle Bal, MD     EG/MEDQ  D:  10/04/2014  T:  10/04/2014  Job:  [id number]

## 2014-10-04 NOTE — Progress Notes (Signed)
Discussed discharge instructions and medications with patient and husband. Both verbalized understanding with all questions answered. VSS. Pt discharged home with husband  [facility]

## 2014-10-05 ENCOUNTER — Encounter (HOSPITAL_COMMUNITY): Payer: Self-pay | Admitting: Cardiothoracic Surgery

## 2014-10-11 ENCOUNTER — Encounter (HOSPITAL_COMMUNITY): Payer: Self-pay | Admitting: Cardiothoracic Surgery

## 2014-10-13 ENCOUNTER — Encounter (HOSPITAL_COMMUNITY): Payer: Self-pay | Admitting: Cardiothoracic Surgery

## 2014-10-15 ENCOUNTER — Other Ambulatory Visit: Payer: Self-pay | Admitting: Cardiothoracic Surgery

## 2014-10-15 DIAGNOSIS — J9859 Other diseases of mediastinum, not elsewhere classified: Secondary | ICD-10-CM

## 2014-10-18 ENCOUNTER — Ambulatory Visit: Payer: 59 | Admitting: Cardiology

## 2014-10-19 ENCOUNTER — Encounter: Payer: Self-pay | Admitting: Cardiothoracic Surgery

## 2014-10-19 ENCOUNTER — Ambulatory Visit (INDEPENDENT_AMBULATORY_CARE_PROVIDER_SITE_OTHER): Payer: Self-pay | Admitting: Cardiothoracic Surgery

## 2014-10-19 ENCOUNTER — Ambulatory Visit
Admission: RE | Admit: 2014-10-19 | Discharge: 2014-10-19 | Disposition: A | Discharge status: Home / Self Care | Payer: 59 | Source: Ambulatory Visit | Attending: Cardiothoracic Surgery | Admitting: Cardiothoracic Surgery

## 2014-10-19 VITALS — BP 130/86 | HR 85 | Resp 20 | Ht 62.0 in | Wt 159.0 lb

## 2014-10-19 DIAGNOSIS — J9859 Other diseases of mediastinum, not elsewhere classified: Secondary | ICD-10-CM

## 2014-10-19 DIAGNOSIS — R072 Precordial pain: Secondary | ICD-10-CM | POA: Diagnosis not present

## 2014-10-19 DIAGNOSIS — R222 Localized swelling, mass and lump, trunk: Secondary | ICD-10-CM

## 2014-10-19 DIAGNOSIS — E010 Iodine-deficiency related diffuse (endemic) goiter: Secondary | ICD-10-CM

## 2014-10-19 DIAGNOSIS — E049 Nontoxic goiter, unspecified: Secondary | ICD-10-CM | POA: Insufficient documentation

## 2014-10-19 NOTE — Progress Notes (Signed)
[address]       [place] [address]             [phone]      Daleena B Hogge Balta Medical Record #[id number] Date of Birth: June 20, 1940  Referring: Leta Baptist, MD Primary Care: Deloria Lair, MD  Chief Complaint:   POST OP FOLLOW UP 10/01/2014 OPERATIVE REPORT PREOPERATIVE DIAGNOSIS: Large anterior mediastinal mass. POSTOPERATIVE DIAGNOSIS: Large anterior mediastinal mass. SURGICAL PROCEDURE: Partial sternotomy with excision of mediastinal mass and sternal plating. SURGEON: Lanelle Bal, MD. Path: Diagnosis 1. Mediastinum, mass resection, anterior - BENIGN THYROID TISSUE. - THERE IS NO EVIDENCE OF MALIGNANCY. 2. Mediastinum, mass resection, anterior - BENIGN THYROID TISSUE. - THERE IS NO EVIDENCE OF MALIGNANCY. 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 Patient has expected postoperative soreness over the upper sternum but this is improving.      Past Medical History  Diagnosis Date  . Paroxysmal atrial fibrillation     normal echiocardiogram and stress nuclear; low TSH with normal T3 and T4   . HTN (hypertension)   . 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 Low TSH level 01/2010  . Neuromuscular disorder   . PONV (postoperative nausea and vomiting)      History  Smoking status  . Never Smoker   Smokeless tobacco  . Never Used    Comment: does not smoke    History  Alcohol Use  . Yes    Comment: 1 glass of wine at a time just socially     Allergies  Allergen Reactions  . Acyclovir And Related   . Penicillins Other (See Comments)    Unknown   . Sulfonamide Derivatives Other (See Comments)    Leg Pain    Current Outpatient Prescriptions  Medication Sig Dispense Refill  . aspirin 81 MG EC  tablet Take 81 mg by mouth at bedtime.     . fish oil-omega-3 fatty acids 1000 MG capsule Take 1 g by mouth daily.     Marland Kitchen lisinopril-hydrochlorothiazide (PRINZIDE,ZESTORETIC) 10-12.5 MG per tablet Take 1 tablet by mouth daily. 90 tablet 1  . metoprolol tartrate (LOPRESSOR) 25 MG tablet Take 1 tablet (25 mg total) by mouth 2 (two) times daily. 180 tablet 1  . Multiple Vitamins-Minerals (HAIR/SKIN/NAILS PO) Take 1 tablet by mouth daily.    . ranitidine (ZANTAC) 150 MG tablet Take 150 mg by mouth at bedtime.    . traMADol (ULTRAM) 50 MG tablet Take 1 tablet (50 mg total) by mouth every 6 (six) hours as needed (mild pain). 30 tablet 0  . zinc gluconate 50 MG tablet Take 50 mg by mouth daily.       No current facility-administered medications for this visit.       Physical Exam: BP 130/86 mmHg  Pulse 85  Resp 20  Ht 5\' 2"  (1.575 m)  Wt 159 lb (72.122 kg)  BMI 29.07 kg/m2  SpO2 97%  General appearance: alert and cooperative Neurologic: intact Heart: regular rate and rhythm, S1, S2 normal, no murmur, click, rub or gallop Lungs: clear to auscultation bilaterally Abdomen: soft, non-tender; bowel sounds normal; no masses,  no organomegaly Extremities: extremities normal, atraumatic, no cyanosis or edema and Homans sign is negative, no  sign of DVT Wound: Partial sternotomy is stable and well-healed   Diagnostic Studies & Laboratory data:     Recent Radiology Findings:   Dg Chest 2 View  10/19/2014   CLINICAL DATA:  Increasing pain at the sternotomy site following resection of a mediastinal mass on October 01, 2014  EXAM: CHEST  2 VIEW  COMPARISON:  PA and lateral chest x-ray of October 04, 2014  FINDINGS: The lungs are well-expanded and clear. The retrosternal soft tissues are normal. The sternal plates and screws appear to be in appropriate position. The mediastinum is normal in width. The heart and pulmonary vascularity are normal. There is stable apical pleural thickening bilaterally. The right  internal jugular venous catheter has been removed. The ribs and thoracic spine exhibit no acute abnormalities.  IMPRESSION: No acute abnormality of the mediastinum is observed. Elsewhere there is no acute abnormality either. If the patient's symptoms warrant further evaluation, chest CT scanning would be the most useful imaging modality.   Electronically Signed   By: David  Martinique M.D.   On: 10/19/2014 15:01      Recent Lab Findings: Lab Results  Component Value Date   WBC 13.4* 10/03/2014   HGB 11.9* 10/03/2014   HCT 33.7* 10/03/2014   PLT 161 10/03/2014   GLUCOSE 117* 10/03/2014   CHOL  09/08/2007    142        ATP III CLASSIFICATION:  <200     mg/dL   Desirable  200-239  mg/dL   Borderline High  >=240    mg/dL   High   TRIG 215* 09/08/2007   HDL 37* 09/08/2007   LDLCALC  09/08/2007    62        Total Cholesterol/HDL:CHD Risk Coronary Heart Disease Risk Table                     Men   Women  1/2 Average Risk   3.4   3.3   ALT 44 10/03/2014   AST 27 10/03/2014   NA 127* 10/03/2014   K 3.5 10/03/2014   CL 95* 10/03/2014   CREATININE 0.66 10/03/2014   BUN 8 10/03/2014   CO2 27 10/03/2014   TSH 0.345* 09/01/2010   INR 1.03 09/29/2014   HGBA1C  09/07/2007    5.9 (NOTE)   The ADA recommends the following therapeutic goals for glycemic   control related to Hgb A1C measurement:   Goal of Therapy:   < 7.0% Hgb A1C   Action Suggested:  > 8.0% Hgb A1C   Ref:  Diabetes Care, 22, Suppl. 1, 1999      Assessment / Plan:      Patient doing well following surgical resection of large anterior mediastinal mass was found to be benign thyroid tissue She will return to work in 2 weeks, half a day for 2 weeks and then full time. I've cautioned her about any lifting over 20-25 pounds for 3 months Plan to see her back with a follow-up chest x-ray in 3 months.   Grace Isaac MD      [address] [place] [address] Office [phone]   Beeper  [phone]  10/19/2014 3:36 PM

## 2014-10-22 ENCOUNTER — Other Ambulatory Visit: Payer: Self-pay | Admitting: Otolaryngology

## 2014-10-25 ENCOUNTER — Ambulatory Visit (INDEPENDENT_AMBULATORY_CARE_PROVIDER_SITE_OTHER): Payer: 59 | Admitting: Cardiology

## 2014-10-25 ENCOUNTER — Encounter: Payer: Self-pay | Admitting: Cardiology

## 2014-10-25 VITALS — BP 103/71 | HR 74 | Ht 62.0 in | Wt 157.0 lb

## 2014-10-25 DIAGNOSIS — I480 Paroxysmal atrial fibrillation: Secondary | ICD-10-CM | POA: Diagnosis not present

## 2014-10-25 NOTE — Progress Notes (Signed)
Patient ID: OMOLARA CAROL, female   DOB: March 19, 1941, 74 y.o.   MRN: [id number]     Clinical Summary Ms. Vandevelde is a 74 y.o.female seen today for follow up of the following medical problems.   1. Parox afib - from Dr Earline Mayotte notes thought to have been one isolated episode that occurred in the setting of high physical activity, excessive heat, and being on prednisone several years ago. Was followed clinically with no clear evidence of recurrence - during stress test 12/2013 recurrent episode of afib during exercise stress test - no recent palpitations - we discussed anticoag at last visit however with her upcoming surgeries she elected to stay on ASA.    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    Past Medical History  Diagnosis Date  . Paroxysmal atrial fibrillation     normal echiocardiogram and stress nuclear; low TSH with normal T3 and T4   . HTN (hypertension)   . 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 Low TSH level 01/2010  . Neuromuscular disorder   . PONV (postoperative nausea and vomiting)      Allergies  Allergen Reactions  . Acyclovir And Related   . Penicillins Other (See Comments)    Unknown   . Sulfonamide Derivatives Other (See Comments)    Leg Pain     Current Outpatient Prescriptions  Medication Sig Dispense Refill  . aspirin 81 MG EC tablet Take 81 mg by mouth at bedtime.     . fish oil-omega-3 fatty acids 1000 MG capsule Take 1 g by mouth daily.     Marland Kitchen lisinopril-hydrochlorothiazide (PRINZIDE,ZESTORETIC) 10-12.5 MG per tablet Take 1 tablet by mouth daily. 90 tablet 1  . metoprolol tartrate (LOPRESSOR) 25 MG tablet Take 1 tablet (25 mg total) by mouth 2 (two) times daily. 180 tablet 1  . Multiple Vitamins-Minerals (HAIR/SKIN/NAILS PO) Take 1 tablet by mouth daily.    . ranitidine (ZANTAC) 150 MG tablet Take  150 mg by mouth at bedtime.    . traMADol (ULTRAM) 50 MG tablet Take 1 tablet (50 mg total) by mouth every 6 (six) hours as needed (mild pain). 30 tablet 0  . zinc gluconate 50 MG tablet Take 50 mg by mouth daily.       No current facility-administered medications for this visit.     Past Surgical History  Procedure Laterality Date  . Vein ligation and stripping    . Appendectomy    . Cholecystectomy    . Abdominal hysterectomy      w/o oophorectomy  . Bunionectomy      bilateral  . Tonsillectomy    . Tonsillectomy    . Esophagogastroduodenoscopy  12/27/2011    Procedure: ESOPHAGOGASTRODUODENOSCOPY (EGD);  Surgeon: Rogene Houston, MD;  Location: [facility];  Service: Endoscopy;  Laterality: N/A;  250  . Resection of mediastinal mass N/A 10/01/2014    Procedure: RESECTION OF MEDIASTINAL MASS;  Surgeon: Grace Isaac, MD;  Location: [place];  Service: Thoracic;  Laterality: N/A;  . Sternotomy N/A 10/01/2014    Procedure: STERNOTOMY;  Surgeon: Grace Isaac, MD;  Location: [facility];  Service: Thoracic;  Laterality: N/A;     Allergies  Allergen Reactions  . Acyclovir And Related   . Penicillins Other (See Comments)    Unknown   . Sulfonamide Derivatives  Other (See Comments)    Leg Pain      Family History  Problem Relation Age of Onset  . Heart disease Father   . Hypertension Father   . Diabetes Mother   . Hypertension Mother      Social History Ms. Narain reports that she has never smoked. She has never used smokeless tobacco. Ms. Haan reports that she drinks alcohol.   Review of Systems CONSTITUTIONAL: No weight loss, fever, chills, weakness or fatigue.  HEENT: Eyes: No visual loss, blurred vision, double vision or yellow sclerae.No hearing loss, sneezing, congestion, runny nose or sore throat.  SKIN: No rash or itching.  CARDIOVASCULAR: per HPI RESPIRATORY: No shortness of breath, cough or sputum.  GASTROINTESTINAL: No anorexia, nausea, vomiting or  diarrhea. No abdominal pain or blood.  GENITOURINARY: No burning on urination, no polyuria NEUROLOGICAL: No headache, dizziness, syncope, paralysis, ataxia, numbness or tingling in the extremities. No change in bowel or bladder control.  MUSCULOSKELETAL: No muscle, back pain, joint pain or stiffness.  LYMPHATICS: No enlarged nodes. No history of splenectomy.  PSYCHIATRIC: No history of depression or anxiety.  ENDOCRINOLOGIC: No reports of sweating, cold or heat intolerance. No polyuria or polydipsia.  Marland Kitchen   Physical Examination Filed Vitals:   10/25/14 1546  BP: 103/71  Pulse: 74   Filed Vitals:   10/25/14 1546  Height: 5\' 2"  (1.575 m)  Weight: 157 lb (71.215 kg)    Gen: resting comfortably, no acute distress HEENT: no scleral icterus, pupils equal round and reactive, no palptable cervical adenopathy,  CV: RRR, no m/r/g, no JVD Resp: Clear to auscultation bilaterally GI: abdomen is soft, non-tender, non-distended, normal bowel sounds, no hepatosplenomegaly MSK: extremities are warm, no edema.  Skin: warm, no rash Neuro:  no focal deficits Psych: appropriate affect   Diagnostic Studies 12/2013 MPI IMPRESSION: 1. Probable soft tissue attenuation affecting the inferior/inferolateral wall without definitive evidence of ischemia.  2. No focal LV wall motion abnormalities.  3. Left ventricular ejection fraction 65%  4. Transient atrial fibrillation noted during exercise, associated with chest discomfort as well as PVCs versus aberrently conducted beats.  5. Low-risk stress test findings*.    Assessment and Plan   1. Parox afib - her afib appears to be recurrent, we discussed her indications for anticoagulation again today. Discussed risks and benefits of ASA vs anticoagulation regarding stroke prevention and bleeding risk. CHADS2Vasc score is 3 (age x2, gender, HTN), anticoag has been recommended.  - she remains hesitant to start anticoagulation and elects to  continue ASA. She is to call us if she changes her mind. Continue metoprolol.     F/u 6 months.        Arnoldo Lenis, M.D.

## 2014-10-25 NOTE — Patient Instructions (Signed)
Your physician recommends that you continue on your current medications as directed. Please refer to the Current Medication list given to you today. Your physician recommends that you schedule a follow-up appointment in: 6 months. You will receive a reminder letter in the mail in about 4 months reminding you to call and schedule your appointment. If you don't receive this letter, please contact our office. 

## 2014-11-15 ENCOUNTER — Encounter (HOSPITAL_COMMUNITY)
Admission: RE | Admit: 2014-11-15 | Discharge: 2014-11-15 | Disposition: A | Discharge status: Home / Self Care | Payer: 59 | Source: Ambulatory Visit | Attending: Otolaryngology | Admitting: Otolaryngology

## 2014-11-15 ENCOUNTER — Encounter (HOSPITAL_BASED_OUTPATIENT_CLINIC_OR_DEPARTMENT_OTHER): Payer: Self-pay | Admitting: *Deleted

## 2014-11-15 DIAGNOSIS — I10 Essential (primary) hypertension: Secondary | ICD-10-CM | POA: Diagnosis not present

## 2014-11-15 DIAGNOSIS — R220 Localized swelling, mass and lump, head: Secondary | ICD-10-CM | POA: Diagnosis present

## 2014-11-15 DIAGNOSIS — C07 Malignant neoplasm of parotid gland: Secondary | ICD-10-CM | POA: Diagnosis not present

## 2014-11-15 DIAGNOSIS — K219 Gastro-esophageal reflux disease without esophagitis: Secondary | ICD-10-CM | POA: Diagnosis not present

## 2014-11-15 LAB — BASIC METABOLIC PANEL
ANION GAP: 8 (ref 5–15)
BUN: 14 mg/dL (ref 6–20)
CHLORIDE: 102 mmol/L (ref 101–111)
CO2: 27 mmol/L (ref 22–32)
CREATININE: 0.7 mg/dL (ref 0.44–1.00)
Calcium: 8.9 mg/dL (ref 8.9–10.3)
GFR calc non Af Amer: >60 mL/min (ref >60)
Glucose, Bld: 113 mg/dL — ABNORMAL HIGH (ref 65–99)
POTASSIUM: 3 mmol/L — AB (ref 3.5–5.1)
SODIUM: 137 mmol/L (ref 135–145)

## 2014-11-15 NOTE — Progress Notes (Signed)
Dr Marland KitchenGifford Shave advised of low potassium of 3 states do I STAT in am to recheck Potassium.

## 2014-11-15 NOTE — Progress Notes (Signed)
Pt states seen by Dr Morrell Riddle cardiologist in July. Wanted to put pt on some type of blood thinner but pt state she does not want to do that at present.  She spoke with Dr Lattie Haw her old cardiologist states he suggested halter monitor for one month before placing her on any new med.  So pt is wanting to after the surgery to do this.  Spoke with dr Al Corpus. He spoke to Dr Benjamine Mola.  Pt will come in placed on monitor if in At Fib case will be cancelled.  Pt informed this may happen if in at fib, voiced understanding.

## 2014-11-16 ENCOUNTER — Ambulatory Visit (HOSPITAL_BASED_OUTPATIENT_CLINIC_OR_DEPARTMENT_OTHER)
Admission: RE | Admit: 2014-11-16 | Discharge: 2014-11-17 | Disposition: A | Discharge status: Home / Self Care | Payer: 59 | Source: Ambulatory Visit | Attending: Otolaryngology | Admitting: Otolaryngology

## 2014-11-16 ENCOUNTER — Ambulatory Visit (HOSPITAL_BASED_OUTPATIENT_CLINIC_OR_DEPARTMENT_OTHER): Payer: 59 | Admitting: Certified Registered"

## 2014-11-16 ENCOUNTER — Encounter (HOSPITAL_BASED_OUTPATIENT_CLINIC_OR_DEPARTMENT_OTHER)
Admission: RE | Disposition: A | Discharge status: Home / Self Care | Payer: Self-pay | Source: Ambulatory Visit | Attending: Otolaryngology

## 2014-11-16 ENCOUNTER — Encounter (HOSPITAL_BASED_OUTPATIENT_CLINIC_OR_DEPARTMENT_OTHER): Payer: Self-pay

## 2014-11-16 DIAGNOSIS — C07 Malignant neoplasm of parotid gland: Secondary | ICD-10-CM | POA: Insufficient documentation

## 2014-11-16 DIAGNOSIS — K219 Gastro-esophageal reflux disease without esophagitis: Secondary | ICD-10-CM | POA: Insufficient documentation

## 2014-11-16 DIAGNOSIS — I10 Essential (primary) hypertension: Secondary | ICD-10-CM | POA: Insufficient documentation

## 2014-11-16 DIAGNOSIS — Z9889 Other specified postprocedural states: Secondary | ICD-10-CM

## 2014-11-16 HISTORY — PX: PAROTIDECTOMY: SHX2163

## 2014-11-16 LAB — POCT HEMOGLOBIN-HEMACUE: Hemoglobin: 13 g/dL (ref 12.0–15.0)

## 2014-11-16 SURGERY — EXCISION, PAROTID GLAND
Anesthesia: General | Laterality: Right

## 2014-11-16 MED ORDER — SUCCINYLCHOLINE CHLORIDE 20 MG/ML IJ SOLN: INTRAMUSCULAR | Status: DC | PRN

## 2014-11-16 MED ORDER — CLINDAMYCIN HCL 300 MG PO CAPS: 300.0000 mg | ORAL_CAPSULE | Freq: Three times a day (TID) | ORAL | Status: DC

## 2014-11-16 MED ORDER — OXYCODONE HCL 5 MG PO TABS: 5.0000 mg | ORAL_TABLET | Freq: Once | ORAL | Status: DC | PRN

## 2014-11-16 MED ORDER — MEPERIDINE HCL 25 MG/ML IJ SOLN: 6.2500 mg | INTRAMUSCULAR | Status: DC | PRN

## 2014-11-16 MED ORDER — SUCCINYLCHOLINE CHLORIDE 20 MG/ML IJ SOLN
INTRAMUSCULAR | Status: DC | PRN
  Administered 2014-11-16: 100 mg via INTRAVENOUS

## 2014-11-16 MED ORDER — GLYCOPYRROLATE 0.2 MG/ML IJ SOLN
0.2000 mg | Freq: Once | INTRAMUSCULAR | Status: AC | PRN
  Administered 2014-11-16: 0.2 mg via INTRAVENOUS

## 2014-11-16 MED ORDER — SCOPOLAMINE 1 MG/3DAYS TD PT72: 1.0000 | MEDICATED_PATCH | Freq: Once | TRANSDERMAL | Status: DC | PRN

## 2014-11-16 MED ORDER — LIDOCAINE HCL (CARDIAC) 20 MG/ML IV SOLN
INTRAVENOUS | Status: DC | PRN
  Administered 2014-11-16: 80 mg via INTRAVENOUS

## 2014-11-16 MED ORDER — OXYCODONE-ACETAMINOPHEN 5-325 MG PO TABS: 1.0000 | ORAL_TABLET | ORAL | Status: DC | PRN

## 2014-11-16 MED ORDER — PROPOFOL 10 MG/ML IV BOLUS
INTRAVENOUS | Status: DC | PRN
  Administered 2014-11-16: 150 mg via INTRAVENOUS

## 2014-11-16 MED ORDER — LIDOCAINE HCL 4 % MT SOLN
OROMUCOSAL | Status: DC | PRN
  Administered 2014-11-16: 2 mL via TOPICAL

## 2014-11-16 MED ORDER — PROMETHAZINE HCL 25 MG/ML IJ SOLN
6.2500 mg | Freq: Four times a day (QID) | INTRAMUSCULAR | Status: DC | PRN
  Administered 2014-11-16: 6.25 mg via INTRAVENOUS
  Filled 2014-11-16 (×2): qty 1

## 2014-11-16 MED ORDER — MIDAZOLAM HCL 2 MG/2ML IJ SOLN: 1.0000 mg | INTRAMUSCULAR | Status: DC | PRN

## 2014-11-16 MED ORDER — MIDAZOLAM HCL 2 MG/2ML IJ SOLN
INTRAMUSCULAR | Status: AC
  Filled 2014-11-16: qty 2

## 2014-11-16 MED ORDER — LACTATED RINGERS IV SOLN
INTRAVENOUS | Status: DC
  Administered 2014-11-16 (×2): via INTRAVENOUS

## 2014-11-16 MED ORDER — FENTANYL CITRATE (PF) 100 MCG/2ML IJ SOLN
INTRAMUSCULAR | Status: AC
  Filled 2014-11-16: qty 6

## 2014-11-16 MED ORDER — METOPROLOL TARTRATE 25 MG PO TABS
25.0000 mg | ORAL_TABLET | Freq: Two times a day (BID) | ORAL | Status: DC
  Administered 2014-11-16: 25 mg via ORAL

## 2014-11-16 MED ORDER — DEXAMETHASONE SODIUM PHOSPHATE 4 MG/ML IJ SOLN
INTRAMUSCULAR | Status: DC | PRN
  Administered 2014-11-16: 10 mg via INTRAVENOUS

## 2014-11-16 MED ORDER — PROPOFOL 500 MG/50ML IV EMUL
INTRAVENOUS | Status: AC
  Filled 2014-11-16: qty 50

## 2014-11-16 MED ORDER — ONDANSETRON HCL 4 MG/2ML IJ SOLN
INTRAMUSCULAR | Status: DC | PRN
  Administered 2014-11-16: 4 mg via INTRAVENOUS

## 2014-11-16 MED ORDER — ONDANSETRON HCL 4 MG PO TABS: 4.0000 mg | ORAL_TABLET | ORAL | Status: DC | PRN

## 2014-11-16 MED ORDER — HYDROMORPHONE HCL 1 MG/ML IJ SOLN
0.2500 mg | INTRAMUSCULAR | Status: DC | PRN
  Administered 2014-11-16 (×2): 0.5 mg via INTRAVENOUS

## 2014-11-16 MED ORDER — LISINOPRIL-HYDROCHLOROTHIAZIDE 10-12.5 MG PO TABS: 1.0000 | ORAL_TABLET | Freq: Every day | ORAL | Status: DC

## 2014-11-16 MED ORDER — MORPHINE SULFATE (PF) 2 MG/ML IV SOLN: 2.0000 mg | INTRAVENOUS | Status: DC | PRN

## 2014-11-16 MED ORDER — FENTANYL CITRATE (PF) 100 MCG/2ML IJ SOLN
50.0000 ug | INTRAMUSCULAR | Status: AC | PRN
  Administered 2014-11-16 (×4): 50 ug via INTRAVENOUS

## 2014-11-16 MED ORDER — KCL IN DEXTROSE-NACL 20-5-0.45 MEQ/L-%-% IV SOLN
INTRAVENOUS | Status: DC
  Administered 2014-11-16: 14:00:00 via INTRAVENOUS
  Filled 2014-11-16: qty 1000

## 2014-11-16 MED ORDER — OXYCODONE HCL 5 MG/5ML PO SOLN: 5.0000 mg | Freq: Once | ORAL | Status: DC | PRN

## 2014-11-16 MED ORDER — HYDROMORPHONE HCL 1 MG/ML IJ SOLN
INTRAMUSCULAR | Status: AC
  Filled 2014-11-16: qty 1

## 2014-11-16 MED ORDER — FAMOTIDINE 20 MG PO TABS
20.0000 mg | ORAL_TABLET | Freq: Every day | ORAL | Status: DC
  Administered 2014-11-16: 20 mg via ORAL

## 2014-11-16 MED ORDER — ONDANSETRON HCL 4 MG/2ML IJ SOLN: 4.0000 mg | INTRAMUSCULAR | Status: DC | PRN

## 2014-11-16 MED ORDER — CLINDAMYCIN PHOSPHATE 600 MG/50ML IV SOLN
INTRAVENOUS | Status: DC | PRN
  Administered 2014-11-16: 600 mg via INTRAVENOUS

## 2014-11-16 MED ORDER — LIDOCAINE-EPINEPHRINE 1 %-1:100000 IJ SOLN
INTRAMUSCULAR | Status: DC | PRN
Start: 2014-11-16 — End: 2014-11-16
  Administered 2014-11-16: 4 mL

## 2014-11-16 MED ORDER — PHENYLEPHRINE HCL 10 MG/ML IJ SOLN
10.0000 mg | INTRAVENOUS | Status: DC | PRN
  Administered 2014-11-16: 50 ug/min via INTRAVENOUS

## 2014-11-16 MED ORDER — CLINDAMYCIN PHOSPHATE 600 MG/50ML IV SOLN
INTRAVENOUS | Status: AC
  Filled 2014-11-16: qty 50

## 2014-11-16 SURGICAL SUPPLY — 58 items
APL SRG 3 HI ABS STRL LF PLS (MISCELLANEOUS) ×1
APPLICATOR DR MATTHEWS STRL (MISCELLANEOUS) ×2 IMPLANT
ATTRACTOMAT 16X20 MAGNETIC DRP (DRAPES) IMPLANT
BALL CTTN LRG ABS STRL LF (GAUZE/BANDAGES/DRESSINGS) ×1
BLADE SURG 15 STRL LF DISP TIS (BLADE) ×1 IMPLANT
BLADE SURG 15 STRL SS (BLADE) ×2
CANISTER SUCT 1200ML W/VALVE (MISCELLANEOUS) ×2 IMPLANT
CORDS BIPOLAR (ELECTRODE) ×2 IMPLANT
COTTONBALL LRG STERILE PKG (GAUZE/BANDAGES/DRESSINGS) ×2 IMPLANT
COVER BACK TABLE 60X90IN (DRAPES) ×2 IMPLANT
COVER MAYO STAND STRL (DRAPES) ×2 IMPLANT
DECANTER SPIKE VIAL GLASS SM (MISCELLANEOUS) ×1 IMPLANT
DRAIN CHANNEL 10F 3/8 F FF (DRAIN) ×1 IMPLANT
DRAPE SURG 17X23 STRL (DRAPES) ×1 IMPLANT
DRAPE U-SHAPE 76X120 STRL (DRAPES) ×2 IMPLANT
ELECT COATED BLADE 2.86 ST (ELECTRODE) ×2 IMPLANT
ELECT PAIRED SUBDERMAL (MISCELLANEOUS) ×2
ELECT REM PT RETURN 9FT ADLT (ELECTROSURGICAL) ×2
ELECTRODE PAIRED SUBDERMAL (MISCELLANEOUS) ×1 IMPLANT
ELECTRODE REM PT RTRN 9FT ADLT (ELECTROSURGICAL) ×1 IMPLANT
EVACUATOR SILICONE 100CC (DRAIN) ×1 IMPLANT
FORCEPS BIPOLAR SPETZLER 8 1.0 (NEUROSURGERY SUPPLIES) ×2 IMPLANT
GAUZE SPONGE 4X4 16PLY XRAY LF (GAUZE/BANDAGES/DRESSINGS) ×2 IMPLANT
GLOVE BIO SURGEON STRL SZ 6.5 (GLOVE) ×1 IMPLANT
GLOVE BIO SURGEON STRL SZ7.5 (GLOVE) ×2 IMPLANT
GLOVE BIOGEL PI IND STRL 7.0 (GLOVE) IMPLANT
GLOVE BIOGEL PI INDICATOR 7.0 (GLOVE) ×2
GOWN STRL REUS W/ TWL LRG LVL3 (GOWN DISPOSABLE) ×2 IMPLANT
GOWN STRL REUS W/TWL LRG LVL3 (GOWN DISPOSABLE) ×4
LIQUID BAND (GAUZE/BANDAGES/DRESSINGS) ×2 IMPLANT
LOCATOR NERVE 3 VOLT (DISPOSABLE) ×1 IMPLANT
NDL HYPO 25X1 1.5 SAFETY (NEEDLE) ×1 IMPLANT
NEEDLE HYPO 25X1 1.5 SAFETY (NEEDLE) ×2 IMPLANT
NS IRRIG 1000ML POUR BTL (IV SOLUTION) ×2 IMPLANT
PACK BASIN DAY SURGERY FS (CUSTOM PROCEDURE TRAY) ×2 IMPLANT
PAD ALCOHOL SWAB (MISCELLANEOUS) ×4 IMPLANT
PENCIL BUTTON HOLSTER BLD 10FT (ELECTRODE) ×2 IMPLANT
PIN SAFETY STERILE (MISCELLANEOUS) IMPLANT
PROBE NERVBE PRASS .33 (MISCELLANEOUS) ×2 IMPLANT
SHEARS HARMONIC 9CM CVD (BLADE) ×2 IMPLANT
SLEEVE SCD COMPRESS KNEE MED (MISCELLANEOUS) ×2 IMPLANT
SPONGE INTESTINAL PEANUT (DISPOSABLE) ×3 IMPLANT
STAPLER VISISTAT 35W (STAPLE) IMPLANT
SUT ETHILON 3 0 PS 1 (SUTURE) ×1 IMPLANT
SUT PROLENE 5 0 P 3 (SUTURE) ×2 IMPLANT
SUT SILK 2 0 FS (SUTURE) ×5 IMPLANT
SUT SILK 2 0 TIES 17X18 (SUTURE)
SUT SILK 2-0 18XBRD TIE BLK (SUTURE) IMPLANT
SUT SILK 3 0 TIES 17X18 (SUTURE) ×2
SUT SILK 3-0 18XBRD TIE BLK (SUTURE) ×1 IMPLANT
SUT SILK 4 0 TIES 17X18 (SUTURE) IMPLANT
SUT VIC AB 3-0 FS2 27 (SUTURE) IMPLANT
SUT VICRYL 4-0 PS2 18IN ABS (SUTURE) ×3 IMPLANT
SYR BULB 3OZ (MISCELLANEOUS) ×2 IMPLANT
SYR CONTROL 10ML LL (SYRINGE) ×2 IMPLANT
TOWEL OR 17X24 6PK STRL BLUE (TOWEL DISPOSABLE) ×2 IMPLANT
TRAY DSU PREP LF (CUSTOM PROCEDURE TRAY) ×2 IMPLANT
TUBE CONNECTING 20X1/4 (TUBING) ×2 IMPLANT

## 2014-11-16 NOTE — Discharge Instructions (Addendum)
Parotidectomy Care After Refer to this sheet in the next few weeks. These instructions provide you with information on caring for yourself after your procedure. Your caregiver may also give you more specific instructions. Your treatment has been planned according to current medical practices, but problems sometimes occur. Call your caregiver if you have any problems or questions after your procedure. HOME CARE INSTRUCTIONS Wound care  Check your incision every day to make sure that it is not red.  Your stitches will be taken out after about 7 days. Pain  Some pain is normal after a parotidectomy. Take whatever pain medicine your surgeon prescribes. Follow the directions carefully.  Do not take over-the-counter pain medicine unless approved by your caregiver.Some painkillers, like aspirin, can cause bleeding. Diet 1. You can eat like you normally do once you are home. However, it might hurt to chew for a while. Stay away from foods that are hard to chew. 2. It may help to take your pain medicine about 30 minutes before you eat. Other precautions  Keep your head propped up when you lie down. Try using 2 pillows to do this. Do it for about 2 weeks after your surgery.  You can probably go back to your normal routine after a few days.However, do not do anything that requires great effort until your surgeon says it is okay. SEEK MEDICAL CARE IF:  You have any questions about your medicines.  Pain does not go away, even after taking pain medicine.  You vomit or feel nauseous. SEEK IMMEDIATE MEDICAL CARE IF:   You are taking pain medicine but your pain gets much worse.  Yourincision looks red and swollen or blood or fluid leaks from the wound.  Skin on your ear or face gets red and swollen.  Your face is numb or feels weak.  You have a fever. MAKE SURE YOU:  Understand these instructions.  Will watch your condition.  Will get help right away if you are not doing well or get  worse. Document Released: 04/07/2010 Document Revised: 05/28/2011 Document Reviewed: 04/07/2010 [facility] Patient Information 2015 [place], [place]. This information is not intended to replace advice given to you by your health care provider. Make sure you discuss any questions you have with your health care provider.      Post Anesthesia Home Care Instructions  Activity: Get plenty of rest for the remainder of the day. A responsible adult should stay with you for 24 hours following the procedure.  For the next 24 hours, DO NOT: -Drive a car -Paediatric nurse -Drink alcoholic beverages -Take any medication unless instructed by your physician -Make any legal decisions or sign important papers.  Meals: Start with liquid foods such as gelatin or soup. Progress to regular foods as tolerated. Avoid greasy, spicy, heavy foods. If nausea and/or vomiting occur, drink only clear liquids until the nausea and/or vomiting subsides. Call your physician if vomiting continues.  Special Instructions/Symptoms: Your throat may feel dry or sore from the anesthesia or the breathing tube placed in your throat during surgery. If this causes discomfort, gargle with warm salt water. The discomfort should disappear within 24 hours.  If you had a scopolamine patch placed behind your ear for the management of post- operative nausea and/or vomiting:  1. The medication in the patch is effective for 72 hours, after which it should be removed.  Wrap patch in a tissue and discard in the trash. Wash hands thoroughly with soap and water. 2. You may remove the patch  earlier than 72 hours if you experience unpleasant side effects which may include dry mouth, dizziness or visual disturbances. 3. Avoid touching the patch. Wash your hands with soap and water after contact with the patch.         JP Drain [employer] this sheet to all of your post-operative appointments while you have your drains.  Please measure  your drains by CC's or ML's.  Make sure you drain and measure your JP Drains 2 or 3 times per day.  At the end of each day, add up totals for the left side and add up totals for the right side.    ( 9 am )     ( 3 pm )        ( 9 pm )                Date L  R  L  R  L  R  Total L/R

## 2014-11-16 NOTE — Anesthesia Procedure Notes (Signed)
Procedure Name: Intubation Date/Time: 11/16/2014 10:07 AM Performed by: Baxter Flattery Pre-anesthesia Checklist: Patient identified, Emergency Drugs available, Suction available and Patient being monitored Patient Re-evaluated:Patient Re-evaluated prior to inductionOxygen Delivery Method: Circle System Utilized Preoxygenation: Pre-oxygenation with 100% oxygen Intubation Type: IV induction Ventilation: Mask ventilation without difficulty Laryngoscope Size: Glidescope and 4 Grade View: Grade III Tube type: Oral Number of attempts: 1 Airway Equipment and Method: Stylet and LTA kit utilized Placement Confirmation: ETT inserted through vocal cords under direct vision,  positive ETCO2 and breath sounds checked- equal and bilateral Secured at: 22 cm Tube secured with: Tape Dental Injury: Teeth and Oropharynx as per pre-operative assessment

## 2014-11-16 NOTE — Anesthesia Preprocedure Evaluation (Addendum)
Anesthesia Evaluation  Patient identified by MRN, date of birth, ID band Patient awake    Reviewed: Allergy & Precautions, NPO status , Patient's Chart, lab work & pertinent test results  History of Anesthesia Complications (+) PONV  Airway Mallampati: I  TM Distance: >3 FB Neck ROM: Full    Dental  (+) Teeth Intact, Dental Advisory Given   Pulmonary  breath sounds clear to auscultation        Cardiovascular hypertension, Pt. on medications and Pt. on home beta blockers Rhythm:Regular Rate:Normal     Neuro/Psych    GI/Hepatic GERD-  Medicated and Controlled,  Endo/Other    Renal/GU      Musculoskeletal   Abdominal   Peds  Hematology   Anesthesia Other Findings   Reproductive/Obstetrics                            Anesthesia Physical Anesthesia Plan  ASA: III  Anesthesia Plan: General   Post-op Pain Management:    Induction: Intravenous  Airway Management Planned: Oral ETT  Additional Equipment:   Intra-op Plan:   Post-operative Plan: Extubation in OR  Informed Consent: I have reviewed the patients History and Physical, chart, labs and discussed the procedure including the risks, benefits and alternatives for the proposed anesthesia with the patient or authorized representative who has indicated his/her understanding and acceptance.   Dental advisory given  Plan Discussed with: CRNA, Anesthesiologist and Surgeon  Anesthesia Plan Comments:         Anesthesia Quick Evaluation

## 2014-11-16 NOTE — H&P (Signed)
Cc: Right parotid mass  HPI: The patient is a 74 year old female who returns today for her follow-up evaluation. The patient was recently seen for a right facial mass.  She subsequently underwent a neck CT scan.  The CT showed a 2 cm right parotid mass.  In addition, the CT also showed a large anterior mediastinal mass. She subsequently underwent a chest CT scan.  The CT scan showed a nearly 10 cm lobulated enhancing anterior mediastinal mass. The mass appeared to be separate from the thyroid gland. The primary differential consideration for the mediastinal mass is a germ cell tumor. The patient returns today reporting no new symptoms.  She denies any significant dysphagia, odynophagia or dyspnea.  No other ENT, GI, or respiratory issue noted since the last visit.   Exam General: Communicates without difficulty, well nourished, no acute distress. Head: Normocephalic, no evidence injury, no tenderness, facial buttresses intact without stepoff. Eyes: PERRL, EOMI. No scleral icterus, conjunctivae clear. Neuro: CN II exam reveals vision grossly intact. No nystagmus at any point of gaze. Ears: Auricles well formed without lesions. Ear canals are intact without mass or lesion. No erythema or edema is appreciated. The TMs are intact without fluid. Nose: External evaluation reveals normal support and skin without lesions. Dorsum is intact. Anterior rhinoscopy reveals mildly congested nasal mucosa. Oral:  Oral cavity and oropharynx are intact, symmetric, without erythema or edema. Mucosa is moist without lesions. Neck: Full range of motion without pain. There is no significant lymphadenopathy. No masses palpable. Thyroid bed within normal limits to palpation. A firm 2 cm mass is noted anterior to the right ear.  No surrounding erythema or skin changes are noted. Trachea is midline. Neuro:  CN 2-12 grossly intact. Gait normal.   Assessment 1.  2 cm right parotid mass is noted on her neck CT scan.    Plan  1.  The  CT images are reviewed with the patient.   2.  We will proceed with removal of her right parotid mass.   3.  The patient is encouraged to call with any questions or concerns.

## 2014-11-16 NOTE — Progress Notes (Signed)
Per Dr. Al Corpus cancel repeat K+ today.

## 2014-11-16 NOTE — Transfer of Care (Signed)
Immediate Anesthesia Transfer of Care Note  Patient: [name]  Procedure(s) Performed: Procedure(s): RIGHT PAROTIDECTOMY (Right)  Patient Location: PACU  Anesthesia Type:General  Level of Consciousness: awake, alert  and oriented  Airway & Oxygen Therapy: Patient Spontanous Breathing and Patient connected to face mask oxygen  Post-op Assessment: Report given to RN, Post -op Vital signs reviewed and stable and Patient moving all extremities  Post vital signs: Reviewed and stable  Last Vitals:  Filed Vitals:   11/16/14 0831  BP: 162/99  Pulse: 72  Temp: 36.5 C  Resp: 20    Complications: No apparent anesthesia complications

## 2014-11-16 NOTE — Anesthesia Postprocedure Evaluation (Signed)
  Anesthesia Post-op Note  Patient: [name]  Procedure(s) Performed: Procedure(s): RIGHT PAROTIDECTOMY (Right)  Patient Location: PACU  Anesthesia Type: General   Level of Consciousness: awake, alert  and oriented  Airway and Oxygen Therapy: Patient Spontanous Breathing  Post-op Pain: mild  Post-op Assessment: Post-op Vital signs reviewed  Post-op Vital Signs: Reviewed  Last Vitals:  Filed Vitals:   11/16/14 1500  BP:   Pulse: 72  Temp:   Resp:     Complications: No apparent anesthesia complications

## 2014-11-16 NOTE — Op Note (Signed)
DATE OF PROCEDURE:  11/16/2014                              OPERATIVE REPORT  SURGEON:  Leta Baptist, MD  ASSISTANT: Rometta Emery, PA-C  PREOPERATIVE DIAGNOSES: 1. Right parotid mass  POSTOPERATIVE DIAGNOSES: 1. Right parotid mass  PROCEDURE PERFORMED:  Right lateral parotidectomy with facial nerve dissection and preservation.  ANESTHESIA:  General endotracheal tube anesthesia.  COMPLICATIONS:  None.  ESTIMATED BLOOD LOSS:  50 ml  INDICATION FOR PROCEDURE:  [name] is a 74 y.o. female with a history of an enlarging right parotid mass. On her CT scan, a 2 cm soft tissue mass was noted within the right parotid gland. The patient also has a large 10 cm mediastinal mass, which was recently resected. The patient would like to have the parotid mass removed as well. The risks, benefits, alternatives, and details of the procedure were discussed with the mother.  Questions were invited and answered.  Informed consent was obtained.  DESCRIPTION:  The patient was taken to the operating room and placed supine on the operating table.  General endotracheal tube anesthesia was administered by the anesthesiologist.  The patient was positioned and prepped and draped in a standard fashion for right parotidectomy surgery. Facial nerve monitoring electrodes were placed. The facial nerve monitoring system was functional throughout the case.   1% lidocaine with 1-100,000 epinephrine was was infiltrated at the planned site of incision. A standard facelift incision was made anterior to the right preauricular crease, and carried down to the left lateral neck in a lazy S fashion. The skin flap overlying the right parotid gland was elevated. Dissection was carried out along the anterior preauricular plane. The main trunk of the facial nerve was identified. Careful dissection was then performed to free all branches of the right facial nerve. A 2 cm parotid mass was noted superficial to the bifurcation of the  facial nerve. The mass was noted to be abutting the branches of the facial nerve. All branches of the facial nerve were dissected free from the parotid mass. The entire mass was removed and sent to the pathology department for permanent histologic identification. All branches of the nerve were noted to be functional at the end of the case. The surgical site was copiously irrigated. A #10 JP drain was placed. The incision was closed in layers with 4-0 Vicryls and Dermabond.  The care of the patient was turned over to the anesthesiologist.  The patient was awakened from anesthesia without difficulty.  She was extubated and transferred to the recovery room in good condition.  OPERATIVE FINDINGS: A 2 cm right parotid mass.  SPECIMEN:  Right parotid mass  FOLLOWUP CARE:  The patient will be observed overnight.  She will be placed on clindamycin 300 mg p.o. t.i.d. for 5 days.  Tylenol with Hydrocodone can be taken on a p.r.n. basis for pain control.  The patient will follow up in my office in approximately 1 week.  Ascencion Dike 11/16/2014 11:57 AM

## 2014-11-17 ENCOUNTER — Encounter (HOSPITAL_BASED_OUTPATIENT_CLINIC_OR_DEPARTMENT_OTHER): Payer: Self-pay | Admitting: Otolaryngology

## 2014-11-17 NOTE — Discharge Summary (Signed)
Physician Discharge Summary  Patient ID: [name] MRN: [id number] DOB/AGE: 11/14/1940 75 y.o.  Admit date: 11/16/2014 Discharge date: 11/17/2014  Admission Diagnoses: Right parotid mass  Discharge Diagnoses: Right parotid mass Active Problems:   H/O superficial parotidectomy   Discharged Condition: good  Hospital Course: Pt had an uneventful overnight stay. Pt tolerated po well. No bleeding. No stridor. Facial nerve function intact bilaterally.  Consults: None  Significant Diagnostic Studies: None  Treatments: surgery: Right lateral parotidectomy  Discharge Exam: Blood pressure 127/75, pulse 74, temperature 97.1 F (36.2 C), temperature source Oral, resp. rate 20, height 5\' 2"  (1.575 m), weight 71.328 kg (157 lb 4 oz), SpO2 97 %. Incision/Wound:c/d/i Normal facial nerve function  Disposition: 01-Home or Self Care  Discharge Instructions    Activity as tolerated - No restrictions    Complete by:  As directed      Diet general    Complete by:  As directed             Medication List    STOP taking these medications        aspirin 81 MG EC tablet     fish oil-omega-3 fatty acids 1000 MG capsule     traMADol 50 MG tablet  Commonly known as:  ULTRAM      TAKE these medications        clindamycin 300 MG capsule  Commonly known as:  CLEOCIN  Take 1 capsule (300 mg total) by mouth 3 (three) times daily.     HAIR/SKIN/NAILS PO  Take 1 tablet by mouth daily.     lisinopril-hydrochlorothiazide 10-12.5 MG per tablet  Commonly known as:  PRINZIDE,ZESTORETIC  Take 1 tablet by mouth daily.     metoprolol tartrate 25 MG tablet  Commonly known as:  LOPRESSOR  Take 1 tablet (25 mg total) by mouth 2 (two) times daily.     oxyCODONE-acetaminophen 5-325 MG per tablet  Commonly known as:  ROXICET  Take 1-2 tablets by mouth every 4 (four) hours as needed for severe pain.     ranitidine 150 MG tablet  Commonly known as:  ZANTAC  Take 150 mg by mouth at bedtime.      zinc gluconate 50 MG tablet  Take 50 mg by mouth daily.           Follow-up Information    Follow up with Ascencion Dike, MD On 11/25/2014.   Specialty:  Otolaryngology   Why:  at 3:40 pm   Contact information:   [address] [place] [address] [phone]       Signed: Ascencion Dike 11/17/2014, 7:45 AM

## 2014-11-25 ENCOUNTER — Ambulatory Visit (INDEPENDENT_AMBULATORY_CARE_PROVIDER_SITE_OTHER): Payer: 59 | Admitting: Otolaryngology

## 2014-11-30 ENCOUNTER — Encounter (HOSPITAL_COMMUNITY): Payer: Self-pay | Admitting: Hematology & Oncology

## 2014-11-30 ENCOUNTER — Encounter (HOSPITAL_COMMUNITY): Payer: Self-pay

## 2014-11-30 ENCOUNTER — Encounter (HOSPITAL_COMMUNITY): Payer: 59 | Attending: Hematology & Oncology | Admitting: Hematology & Oncology

## 2014-11-30 VITALS — BP 130/93 | HR 81 | Temp 98.0°F | Resp 20 | Ht 62.0 in | Wt 160.4 lb

## 2014-11-30 DIAGNOSIS — R52 Pain, unspecified: Secondary | ICD-10-CM

## 2014-11-30 DIAGNOSIS — C07 Malignant neoplasm of parotid gland: Secondary | ICD-10-CM | POA: Diagnosis not present

## 2014-11-30 DIAGNOSIS — Z9889 Other specified postprocedural states: Secondary | ICD-10-CM

## 2014-11-30 NOTE — Progress Notes (Signed)
[place] at [place] NOTE  Patient Care Team: Zella Richer. Scotty Court, MD as PCP - General (Unknown Physician Specialty) Rogene Houston, MD (Gastroenterology) Leta Baptist, MD as Consulting Physician (Otolaryngology)  CHIEF COMPLAINTS/PURPOSE OF CONSULTATION:  Adenocarcinoma of the Right Parotid Gland Final pathology of the parotid gland showing an adenocarcinoma, intermediate to focally high grade, nonspecific intercalated duct type, 1.8 cm in greatest dimension, no perineural nor angiolymphatic invasion margins are negative (second opinion at [facility]) pT1,pNX Right lateral parotidectomy with facial nerve dissection and preservation on 11/16/2014 with Dr. Benjamine Mola Partial sternotomy with excision of mediastinal mass and sternal plating by Dr. Servando Snare on 10/01/2014 Final pathology of mediastinal mass benign thyroid tissue with no evidence of malignancy  HISTORY OF PRESENTING ILLNESS:  [name] 74 y.o. female is here because of newly diagnosed adenocarcinoma of the parotid. She notes she saw her primary care physician in March for an eye infection. She had a small palpable mass in the right cheek at that visit. She was given antibiotics but states the area in the cheek did not improve. She return for additional follow-up in May at which time she was referred to Dr. Benjamine Mola for consultation. CT scan of the neck was obtained on 08/24/2014 which showed a 19 mm mass in the superficial right parotid consistent with neoplasm. Unfortunately the anterior mediastinum was partially visualized and showed a mediastinal mass at least 7 cm in appearance. This led to CT imaging of the chest on 08/27/2014 which showed a 9.6 x 3.8 x 6.1 cm lobulated enhancing anterior mediastinal mass. She was then referred to Dr. Pia Mau for additional consultation regarding the mediastinal mass.  The patient underwent a partial sternotomy with excision of the mediastinal mass and sternal plating on 10/01/2014 with  Dr. Servando Snare. Final pathology showed benign thyroid tissue. The patient notes she has had abnormal imaging of the chest dating back to at least 2009.  On 11/16/2014 she then underwent a right lateral parotidectomy with facial nerve dissection and preservation with Dr. Benjamine Mola. Final pathology was an adenocarcinoma, intermediate to focally high grade, 1.8 cm in greatest dimension with no perineural or angiolymphatic invasion. Margins are negative. Secondary consultation at [facility] was pursued with final diagnosis as detailed.  The patient is here today for further discussion of her parotid adenocarcinoma. She notes that her facial surgery has been more painful than her sternotomy. She reports the pain is improving but still has to take pain medications at times to eat.   MEDICAL HISTORY:  Past Medical History  Diagnosis Date  . Paroxysmal atrial fibrillation     normal echiocardiogram and stress nuclear; low TSH with normal T3 and T4   . HTN (hypertension)   . 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 Low TSH level 01/2010  . Neuromuscular disorder   . PONV (postoperative nausea and vomiting)     SURGICAL HISTORY: Past Surgical History  Procedure Laterality Date  . Vein ligation and stripping    . Appendectomy    . Cholecystectomy    . Abdominal hysterectomy      w/o oophorectomy  . Bunionectomy      bilateral  . Tonsillectomy    . Tonsillectomy    . Esophagogastroduodenoscopy  12/27/2011    Procedure: ESOPHAGOGASTRODUODENOSCOPY (EGD);  Surgeon: Rogene Houston, MD;  [facility];  Service: Endoscopy;  Laterality: N/A;  250  . Resection of mediastinal mass  N/A 10/01/2014    Procedure: RESECTION OF MEDIASTINAL MASS;  Surgeon: Grace Isaac, MD;  Location: [place];  Service: Thoracic;  Laterality: N/A;  . Sternotomy N/A 10/01/2014    Procedure: STERNOTOMY;  Surgeon: Grace Isaac, MD;  Location: [place];  Service: Thoracic;   Laterality: N/A;  . Parotidectomy Right 11/16/2014    Procedure: RIGHT PAROTIDECTOMY;  Surgeon: Leta Baptist, MD;  Location: [place];  Service: ENT;  Laterality: Right;    SOCIAL HISTORY: Social History   Social History  . Marital Status: Married    Spouse Name: N/A  . Number of Children: N/A  . Years of Education: N/A   Occupational History  . Not on file.   Social History Main Topics  . Smoking status: Never Smoker   . Smokeless tobacco: Never Used     Comment: does not smoke  . Alcohol Use: 0.0 oz/week    0 Standard drinks or equivalent per week     Comment: 1 glass of wine at a time just socially  . Drug Use: No  . Sexual Activity: Not on file   Other Topics Concern  . Not on file   Social History Narrative   Married, employment - Network engineer. No caffeine. 5 yr education   Married for 57 years. 1 child. No grandchildren.  She plays bridge, the organ at church and enjoys traveling. She works at [employer] She is a non-smoker. Her husband used to smoke but quit in 1989. No ETOH, rare wine  FAMILY HISTORY: Family History  Problem Relation Age of Onset  . Heart disease Father   . Hypertension Father   . Diabetes Mother   . Hypertension Mother    indicated that her mother is deceased. She reported the following about her father: conduction system disease requiring pacemaker implantation. She reported the following about her sister: Hx of cardiac disease and pulmonary embolism .   Mother died at 64 from dementia (Alzheimer's ?) Father died at 23 from complications of appendicitis 1 sister who is healthy, she is obese and has some "heart issues."  ALLERGIES:  is allergic to penicillins and sulfonamide derivatives.  MEDICATIONS:  Current Outpatient Prescriptions  Medication Sig Dispense Refill  . clindamycin (CLEOCIN) 300 MG capsule Take 1 capsule (300 mg total) by mouth 3 (three) times daily. (Patient taking differently: Take 300 mg by mouth 4 (four) times daily.  ) 15 capsule 0  . lisinopril-hydrochlorothiazide (PRINZIDE,ZESTORETIC) 10-12.5 MG per tablet Take 1 tablet by mouth daily. 90 tablet 1  . metoprolol tartrate (LOPRESSOR) 25 MG tablet Take 1 tablet (25 mg total) by mouth 2 (two) times daily. 180 tablet 1  . oxyCODONE-acetaminophen (ROXICET) 5-325 MG per tablet Take 1-2 tablets by mouth every 4 (four) hours as needed for severe pain. 30 tablet 0  . ranitidine (ZANTAC) 150 MG tablet Take 150 mg by mouth at bedtime.    . Multiple Vitamins-Minerals (HAIR/SKIN/NAILS PO) Take 1 tablet by mouth daily.    Marland Kitchen zinc gluconate 50 MG tablet Take 50 mg by mouth daily.       No current facility-administered medications for this visit.    Review of Systems  Constitutional: Negative for fever, chills, weight loss and malaise/fatigue.  HENT: Negative for congestion, hearing loss, nosebleeds, sore throat and tinnitus.        R facial pain, mostly with eating. R facial swelling  Eyes: Negative for blurred vision, double vision, pain and discharge.  Respiratory: Negative for cough, hemoptysis, sputum  production, shortness of breath and wheezing.   Cardiovascular: Negative for chest pain, palpitations, claudication, leg swelling and PND.  Gastrointestinal: Negative for heartburn, nausea, vomiting, abdominal pain, diarrhea, constipation, blood in stool and melena.  Genitourinary: Negative for dysuria, urgency, frequency and hematuria.  Musculoskeletal: Negative for myalgias, joint pain and falls.  Skin: Negative for itching and rash.  Neurological: Negative for dizziness, tingling, tremors, sensory change, speech change, focal weakness, seizures, loss of consciousness, weakness and headaches.  Endo/Heme/Allergies: Does not bruise/bleed easily.  Psychiatric/Behavioral: Negative for depression, suicidal ideas, memory loss and substance abuse. The patient is not nervous/anxious and does not have insomnia.    14 point ROS was done and is otherwise as detailed above or  in HPI   PHYSICAL EXAMINATION: ECOG PERFORMANCE STATUS: 0 - Asymptomatic  Filed Vitals:   11/30/14 1507  BP: 130/93  Pulse: 81  Temp: 98 F (36.7 C)  Resp: 20   Filed Weights   11/30/14 1507  Weight: 160 lb 6.4 oz (72.757 kg)     Physical Exam  Constitutional: She is oriented to person, place, and time and well-developed, well-nourished, and in no distress.  HENT:  Head: Normocephalic and atraumatic.  Nose: Nose normal.  Mouth/Throat: Oropharynx is clear and moist. No oropharyngeal exudate.  R surgical incision site anterior to ear and down neck with erythema, small palpable "seroma" fluid collection. No warmth. Hearing aid in place. Swelling noted  Eyes: Conjunctivae and EOM are normal. Pupils are equal, round, and reactive to light. Right eye exhibits no discharge. Left eye exhibits no discharge. No scleral icterus.  Neck: Normal range of motion. Neck supple. No tracheal deviation present. No thyromegaly present.  Cardiovascular: Normal rate, regular rhythm and normal heart sounds.  Exam reveals no gallop and no friction rub.   No murmur heard. Pulmonary/Chest: Effort normal and breath sounds normal. She has no wheezes. She has no rales.  Sternotomy site well healed  Abdominal: Soft. Bowel sounds are normal. She exhibits no distension and no mass. There is no tenderness. There is no rebound and no guarding.  Musculoskeletal: Normal range of motion. She exhibits no edema.  Lymphadenopathy:    She has no cervical adenopathy.  Neurological: She is alert and oriented to person, place, and time. She has normal reflexes. No cranial nerve deficit. Gait normal. Coordination normal.  Skin: Skin is warm and dry. No rash noted.  Psychiatric: Mood, memory, affect and judgment normal.  Nursing note and vitals reviewed.    LABORATORY DATA:  I have reviewed the data as listed Lab Results  Component Value Date   WBC 13.4* 10/03/2014   HGB 13.0 11/16/2014   HCT 33.7* 10/03/2014    MCV 88.5 10/03/2014   PLT 161 10/03/2014     Chemistry      Component Value Date/Time   NA 137 11/15/2014 1400   K 3.0* 11/15/2014 1400   CL 102 11/15/2014 1400   CO2 27 11/15/2014 1400   BUN 14 11/15/2014 1400   CREATININE 0.70 11/15/2014 1400      Component Value Date/Time   CALCIUM 8.9 11/15/2014 1400   ALKPHOS 47 10/03/2014 0420   AST 27 10/03/2014 0420   ALT 44 10/03/2014 0420   BILITOT 1.4* 10/03/2014 0420       RADIOGRAPHIC STUDIES: I have personally reviewed the radiological images as listed and agreed with the findings in the report.   CLINICAL DATA: Mediastinal mass on neck CT  EXAM: CT CHEST WITH CONTRAST  TECHNIQUE: Multidetector CT  imaging of the chest was performed during intravenous contrast administration.  CONTRAST: 50mL OMNIPAQUE IOHEXOL 300 MG/ML SOLN  COMPARISON: CT neck dated 08/24/2014  FINDINGS: Mediastinum/Nodes: 9.6 x 3.8 x 6.1 cm lobulated, enhancing anterior mediastinal mass with scattered calcifications (series 2/ image 22; sagittal image 66). Possible low-density/macroscopic fat within the lesion (series 2/image 15). Although equivocal, a distinct fat plane appears to exits between the mass and the thyroid gland (series 4/ image 38).  Primary differential consideration is a germ cell tumor. Less likely, benign/malignant thymic neoplasm remains possible.  Exophytic thyroid nodule/neoplasm is considered unlikely. Malignant adenopathy/lymphoma is also considered unlikely.  Visualized thyroid is otherwise unremarkable.  Heart is normal in size. No pericardial effusion.  Mild atherosclerotic calcifications of the aortic arch.  Small mediastinal lymph nodes, including a 7 mm short axis AP window node and a 7 mm short axis subcarinal node. No suspicious hilar or axillary lymphadenopathy.  Lungs/Pleura: Lungs are essentially clear.  Mild centrilobular and paraseptal emphysematous changes.  No focal  consolidation.  No pleural effusion or pneumothorax.  Upper abdomen: Visualized upper abdomen is notable for cholecystectomy clips.  Musculoskeletal: Mild degenerative changes of the visualized thoracolumbar spine.  IMPRESSION: 9.6 x 3.8 x 6.1 cm lobulated, enhancing anterior mediastinal mass, as described above. Primary differential consideration is a mediastinal germ cell tumor. Surgical consultation is suggested.   Electronically Signed  By: Julian Hy M.D.  On: 08/27/2014 17:55      CLINICAL DATA: Right-sided neck nodules since March 2016.  EXAM: CT NECK WITH CONTRAST  TECHNIQUE: Multidetector CT imaging of the neck was performed using the standard protocol following the bolus administration of intravenous contrast.  CONTRAST: 75 cc Omnipaque 300 intravenous  COMPARISON: None.  FINDINGS: Pharynx and larynx: No evidence of mass or inflammatory enhancement.  Salivary glands: There is a lobulated mass within the superficial tail of the right parotid gland measuring up to 19 mm in maximal diameter. No additional salivary gland nodule. Appearance is nonspecific, but not typical of a lymph node. Skullbase foramina are symmetric.  Thyroid: Unremarkable.  Lymph nodes: No adenopathy.  Vascular: Major cervical vessels are patent.  Limited intracranial: Negative  Visualized orbits: Unremarkable  Mastoids and visualized paranasal sinuses: Clear.  Skeleton: Unremarkable for age.  Upper chest: Lobulated mass in the superior mediastinum from the thoracic inlet extending beyond the lower margin of the scan. The mass measures at least 7 x 3.8 x 5.5 cm. Coarse calcifications are intermittently noted. The appearance suggests a thyroid nodule, although there is no continuity with the otherwise normal-appearing thyroid.  The apical lungs are clear.  IMPRESSION: 1. 19 mm mass in the superficial right parotid consistent  with neoplasm. 2. Partly visualized anterior mediastinal mass, at least 7 cm. Although the appearance suggests a thyroid nodule, there is no thyroid continuity. Recommend chest CT follow-up for complete visualization.   Electronically Signed  By: Monte Fantasia M.D.  On: 08/24/2014 18:45   ASSESSMENT & PLAN:  Adenocarcinoma of the Right Parotid Gland Final pathology of the parotid gland showing an adenocarcinoma, intermediate to focally high grade, nonspecific intercalated duct type, 1.8 cm in greatest dimension, no perineural nor angiolymphatic invasion margins are negative (second opinion at [facility]) pT1,pNX Right lateral parotidectomy with facial nerve dissection and preservation on 11/16/2014 with Dr. Benjamine Mola Partial sternotomy with excision of mediastinal mass and sternal plating by Dr. Servando Snare on 10/01/2014 Final pathology of mediastinal mass benign thyroid tissue with no evidence of malignancy  Overall she is doing fairly well. She is  somewhat overwhelmed by all of the events the last several months she has been very healthy. She is currently on clindamycin for a postoperative infection. She notes the redness of the right face improving, pain is improving overall.  We discussed the general rare nature of parotid tumors. We reviewed the different types of malignant parotid tumors. I reviewed her pathology. I have made the following recommendations;  1. PET/CT imaging to rule out the possibility of any lymph node involvement in the neck and also for assessment of distant disease.  2. Referral to radiation oncology 3. Presentation at head and neck tumor Board given the rare nature of her tumor.  We will plan on regrouping after her PET/CT to review the results. I have advised her we will keep her apprised of recommendations from tumor board, however I anticipate radiation will be the only additional recommended therapy.   Orders Placed This Encounter  Procedures  . NM PET Image  Initial (PI) Whole Body    Standing Status: Future     Number of Occurrences:      Standing Expiration Date: 11/30/2015    Order Specific Question:  Reason for Exam (SYMPTOM  OR DIAGNOSIS REQUIRED)    Answer:  parotid adenocarcinoma    Order Specific Question:  Preferred imaging location?    Answer:  [facility]    All questions were answered. The patient knows to call the clinic with any problems, questions or concerns.  This note was electronically signed.    Molli Hazard, MD  11/30/2014 5:40 PM

## 2014-11-30 NOTE — Patient Instructions (Addendum)
..  [place] at [facility] Discharge Instructions  RECOMMENDATIONS MADE BY THE CONSULTANT AND ANY TEST RESULTS WILL BE SENT TO YOUR REFERRING PHYSICIAN.  Exam per Dr. Whitney Muse  Pet scan  Then to see [place] back  Thank you for choosing [place] at [facility] to provide your oncology and hematology care.  To afford each patient quality time with our provider, please arrive at least 15 minutes before your scheduled appointment time.    You need to re-schedule your appointment should you arrive 10 or more minutes late.  We strive to give you quality time with our providers, and arriving late affects you and other patients whose appointments are after yours.  Also, if you no show three or more times for appointments you may be dismissed from the clinic at the providers discretion.     Again, thank you for choosing [facility].  Our hope is that these requests will decrease the amount of time that you wait before being seen by our physicians.       _____________________________________________________________  Should you have questions after your visit to [facility], please contact our office at [phone] between the hours of 8:30 a.m. and 4:30 p.m.  Voicemails left after 4:30 p.m. will not be returned until the following business day.  For prescription refill requests, have your pharmacy contact our office.

## 2014-12-02 ENCOUNTER — Telehealth (HOSPITAL_COMMUNITY): Payer: Self-pay | Admitting: Hematology & Oncology

## 2014-12-02 NOTE — Addendum Note (Signed)
Addended by: Kurtis Bushman A on: 12/02/2014 04:33 PM   Modules accepted: Medications

## 2014-12-02 NOTE — Telephone Encounter (Signed)
Entered acct to determine if PET scan requires [facility] [phone]

## 2014-12-07 ENCOUNTER — Ambulatory Visit (HOSPITAL_COMMUNITY)
Admission: RE | Admit: 2014-12-07 | Discharge: 2014-12-07 | Disposition: A | Discharge status: Home / Self Care | Payer: 59 | Source: Ambulatory Visit | Attending: Hematology & Oncology | Admitting: Hematology & Oncology

## 2014-12-07 DIAGNOSIS — Z9889 Other specified postprocedural states: Secondary | ICD-10-CM | POA: Diagnosis not present

## 2014-12-07 DIAGNOSIS — R911 Solitary pulmonary nodule: Secondary | ICD-10-CM | POA: Diagnosis not present

## 2014-12-07 DIAGNOSIS — C07 Malignant neoplasm of parotid gland: Secondary | ICD-10-CM | POA: Insufficient documentation

## 2014-12-07 DIAGNOSIS — I7090 Unspecified atherosclerosis: Secondary | ICD-10-CM | POA: Diagnosis not present

## 2014-12-07 LAB — GLUCOSE, CAPILLARY: Glucose-Capillary: 108 mg/dL — ABNORMAL HIGH (ref 65–99)

## 2014-12-07 MED ORDER — FLUDEOXYGLUCOSE F - 18 (FDG) INJECTION
7.6900 | Freq: Once | INTRAVENOUS | Status: DC | PRN
  Administered 2014-12-07: 7.69 via INTRAVENOUS
  Filled 2014-12-07: qty 7.69

## 2014-12-09 ENCOUNTER — Ambulatory Visit (INDEPENDENT_AMBULATORY_CARE_PROVIDER_SITE_OTHER): Payer: 59 | Admitting: Otolaryngology

## 2014-12-09 ENCOUNTER — Encounter (HOSPITAL_BASED_OUTPATIENT_CLINIC_OR_DEPARTMENT_OTHER): Payer: 59 | Admitting: Hematology & Oncology

## 2014-12-09 ENCOUNTER — Encounter (HOSPITAL_COMMUNITY): Payer: Self-pay | Admitting: Hematology & Oncology

## 2014-12-09 VITALS — BP 145/87 | HR 83 | Temp 98.0°F | Resp 16 | Wt 156.9 lb

## 2014-12-09 DIAGNOSIS — C07 Malignant neoplasm of parotid gland: Secondary | ICD-10-CM | POA: Diagnosis not present

## 2014-12-09 NOTE — Patient Instructions (Signed)
[place] at [facility] Discharge Instructions  RECOMMENDATIONS MADE BY THE CONSULTANT AND ANY TEST RESULTS WILL BE SENT TO YOUR REFERRING PHYSICIAN.  Exam and discussion by Dr. Whitney Muse. Will make a referral to Dr. Lanell Persons at [facility] for a radiation consultation. Call with unexplained weight loss, increased pain or other concerns.  Follow-up here in 3 months.  Thank you for choosing [place] at [facility] to provide your oncology and hematology care.  To afford each patient quality time with our provider, please arrive at least 15 minutes before your scheduled appointment time.    You need to re-schedule your appointment should you arrive 10 or more minutes late.  We strive to give you quality time with our providers, and arriving late affects you and other patients whose appointments are after yours.  Also, if you no show three or more times for appointments you may be dismissed from the clinic at the providers discretion.     Again, thank you for choosing [facility].  Our hope is that these requests will decrease the amount of time that you wait before being seen by our physicians.       _____________________________________________________________  Should you have questions after your visit to [facility], please contact our office at [phone] between the hours of 8:30 a.m. and 4:30 p.m.  Voicemails left after 4:30 p.m. will not be returned until the following business day.  For prescription refill requests, have your pharmacy contact our office.

## 2014-12-09 NOTE — Progress Notes (Signed)
[facility] Note  Patient Care Team: Zella Richer. Scotty Court, MD as PCP - General (Unknown Physician Specialty) Rogene Houston, MD (Gastroenterology) Leta Baptist, MD as Consulting Physician (Otolaryngology)  CHIEF COMPLAINTS/PURPOSE OF CONSULTATION:  Adenocarcinoma of the Right Parotid Gland Final pathology of the parotid gland showing an adenocarcinoma, intermediate to focally high grade, nonspecific intercalated duct type, 1.8 cm in greatest dimension, no perineural nor angiolymphatic invasion margins are negative (second opinion at [facility]) pT1,pNX Right lateral parotidectomy with facial nerve dissection and preservation on 11/16/2014 with Dr. Benjamine Mola Partial sternotomy with excision of mediastinal mass and sternal plating by Dr. Servando Snare on 10/01/2014 Final pathology of mediastinal mass benign thyroid tissue with no evidence of malignancy  HISTORY OF PRESENTING ILLNESS:  [name] 74 y.o. female is here because of newly diagnosed adenocarcinoma of the parotid.  The patient is overall doing well.  We discussed the results of her recent PET scan.  She reports that her facial pain has decreased dramatically in the last day.  She sometimes does have a little bit of pain when she eats and chews.  She stopped taking the pain medication last week.  She only takes these when necessary. Denies trouble with bowels.  She has no other concerns at this time.   MEDICAL HISTORY:  Past Medical History  Diagnosis Date  . Paroxysmal atrial fibrillation     normal echiocardiogram and stress nuclear; low TSH with normal T3 and T4   . HTN (hypertension)   . 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 Low TSH level 01/2010  . Neuromuscular disorder   . PONV (postoperative nausea and vomiting)     SURGICAL HISTORY: Past Surgical History  Procedure Laterality Date  . Vein ligation and stripping    . Appendectomy    .  Cholecystectomy    . Abdominal hysterectomy      w/o oophorectomy  . Bunionectomy      bilateral  . Tonsillectomy    . Tonsillectomy    . Esophagogastroduodenoscopy  12/27/2011    Procedure: ESOPHAGOGASTRODUODENOSCOPY (EGD);  Surgeon: Rogene Houston, MD;  [facility];  Service: Endoscopy;  Laterality: N/A;  250  . Resection of mediastinal mass N/A 10/01/2014    Procedure: RESECTION OF MEDIASTINAL MASS;  Surgeon: Grace Isaac, MD;  Location: [facility];  Service: Thoracic;  Laterality: N/A;  . Sternotomy N/A 10/01/2014    Procedure: STERNOTOMY;  Surgeon: Grace Isaac, MD;  Location: [place];  Service: Thoracic;  Laterality: N/A;  . Parotidectomy Right 11/16/2014    Procedure: RIGHT PAROTIDECTOMY;  Surgeon: Leta Baptist, MD;  Location: [place];  Service: ENT;  Laterality: Right;    SOCIAL HISTORY: Social History   Social History  . Marital Status: Married    Spouse Name: N/A  . Number of Children: N/A  . Years of Education: N/A   Occupational History  . Not on file.   Social History Main Topics  . Smoking status: Never Smoker   . Smokeless tobacco: Never Used     Comment: does not smoke  . Alcohol Use: 0.0 oz/week    0 Standard drinks or equivalent per week     Comment: 1 glass of wine at a time just socially  . Drug Use: No  . Sexual Activity: Not on file   Other Topics Concern  . Not on file  Social History Narrative   Married, employment - Network engineer. No caffeine. 28 yr education   Married for 57 years. 1 child. No grandchildren.  She plays bridge, the organ at church and enjoys traveling. She works at [employer] She is a non-smoker. Her husband used to smoke but quit in 1989. No ETOH, rare wine  FAMILY HISTORY: Family History  Problem Relation Age of Onset  . Heart disease Father   . Hypertension Father   . Diabetes Mother   . Hypertension Mother    indicated that her mother is deceased. She reported the following about her father: conduction  system disease requiring pacemaker implantation. She reported the following about her sister: Hx of cardiac disease and pulmonary embolism .   Mother died at 76 from dementia (Alzheimer's ?) Father died at 71 from complications of appendicitis 1 sister who is healthy, she is obese and has some "heart issues."  ALLERGIES:  is allergic to penicillins and sulfonamide derivatives.  MEDICATIONS:  Current Outpatient Prescriptions  Medication Sig Dispense Refill  . lisinopril-hydrochlorothiazide (PRINZIDE,ZESTORETIC) 10-12.5 MG per tablet Take 1 tablet by mouth daily. 90 tablet 1  . metoprolol tartrate (LOPRESSOR) 25 MG tablet Take 1 tablet (25 mg total) by mouth 2 (two) times daily. 180 tablet 1  . Multiple Vitamins-Minerals (HAIR/SKIN/NAILS PO) Take 1 tablet by mouth daily.    . potassium chloride (K-DUR) 10 MEQ tablet Take 10 mEq by mouth daily.    . ranitidine (ZANTAC) 150 MG tablet Take 150 mg by mouth at bedtime.    Marland Kitchen zinc gluconate 50 MG tablet Take 50 mg by mouth daily.      . clindamycin (CLEOCIN) 300 MG capsule Take 1 capsule (300 mg total) by mouth 3 (three) times daily. (Patient not taking: Reported on 12/09/2014) 15 capsule 0  . oxyCODONE-acetaminophen (ROXICET) 5-325 MG per tablet Take 1-2 tablets by mouth every 4 (four) hours as needed for severe pain. (Patient not taking: Reported on 12/09/2014) 30 tablet 0   No current facility-administered medications for this visit.   Facility-Administered Medications Ordered in Other Visits  Medication Dose Route Frequency Provider Last Rate Last Dose  . fludeoxyglucose F - 18 (FDG) injection 7.69 milli Curie  7.69 milli Curie Intravenous Once PRN Medication Radiologist, MD   7.69 milli Curie at 12/07/14 0830    Review of Systems  Constitutional: Negative for fever, chills, weight loss and malaise/fatigue.  HENT: Negative for congestion, hearing loss, nosebleeds, sore throat and tinnitus.        R facial pain, mostly with eating.   (improving) Eyes: Negative for blurred vision, double vision, pain and discharge.  Respiratory: Negative for cough, hemoptysis, sputum production, shortness of breath and wheezing.   Cardiovascular: Negative for chest pain, palpitations, claudication, leg swelling and PND.  Gastrointestinal: Negative for heartburn, nausea, vomiting, abdominal pain, diarrhea, constipation, blood in stool and melena.  Genitourinary: Negative for dysuria, urgency, frequency and hematuria.  Musculoskeletal: Negative for myalgias, joint pain and falls.  Skin: Negative for itching and rash.  Neurological: Negative for dizziness, tingling, tremors, sensory change, speech change, focal weakness, seizures, loss of consciousness, weakness and headaches.  Endo/Heme/Allergies: Does not bruise/bleed easily.  Psychiatric/Behavioral: Negative for depression, suicidal ideas, memory loss and substance abuse. The patient is not nervous/anxious and does not have insomnia.    14 point ROS was done and is otherwise as detailed above or in HPI   PHYSICAL EXAMINATION: ECOG PERFORMANCE STATUS: 0 - Asymptomatic  Filed Vitals:   12/09/14 0811  BP:  145/87  Pulse: 83  Temp: 98 F (36.7 C)  Resp: 16   Filed Weights   12/09/14 0811  Weight: 156 lb 14.4 oz (71.169 kg)    Physical Exam  Constitutional: She is oriented to person, place, and time and well-developed, well-nourished, and in no distress.  HENT:  Head: Normocephalic and atraumatic.  Nose: Nose normal.  Mouth/Throat: Oropharynx is clear and moist. No oropharyngeal exudate.  R surgical incision site anterior to ear and down neck well healing. Swelling and erythema improved. Eyes: Conjunctivae and EOM are normal. Pupils are equal, round, and reactive to light. Right eye exhibits no discharge. Left eye exhibits no discharge. No scleral icterus.  Neck: Normal range of motion. Neck supple. No tracheal deviation present. No thyromegaly present.  Cardiovascular: Normal  rate, regular rhythm and normal heart sounds.  Exam reveals no gallop and no friction rub.   No murmur heard. Pulmonary/Chest: Effort normal and breath sounds normal. She has no wheezes. She has no rales.  Sternotomy site well healed  Abdominal: Soft. Bowel sounds are normal. She exhibits no distension and no mass. There is no tenderness. There is no rebound and no guarding.  Musculoskeletal: Normal range of motion. She exhibits no edema.  Lymphadenopathy:    She has no cervical adenopathy.  Neurological: She is alert and oriented to person, place, and time. She has normal reflexes. No cranial nerve deficit. Gait normal. Coordination normal.  Skin: Skin is warm and dry. No rash noted.  Psychiatric: Mood, memory, affect and judgment normal.  Nursing note and vitals reviewed.    LABORATORY DATA:  I have reviewed the data as listed Lab Results  Component Value Date   WBC 13.4* 10/03/2014   HGB 13.0 11/16/2014   HCT 33.7* 10/03/2014   MCV 88.5 10/03/2014   PLT 161 10/03/2014     Chemistry      Component Value Date/Time   NA 137 11/15/2014 1400   K 3.0* 11/15/2014 1400   CL 102 11/15/2014 1400   CO2 27 11/15/2014 1400   BUN 14 11/15/2014 1400   CREATININE 0.70 11/15/2014 1400      Component Value Date/Time   CALCIUM 8.9 11/15/2014 1400   ALKPHOS 47 10/03/2014 0420   AST 27 10/03/2014 0420   ALT 44 10/03/2014 0420   BILITOT 1.4* 10/03/2014 0420       RADIOGRAPHIC STUDIES: I have personally reviewed the radiological images as listed and agreed with the findings in the report.  CLINICAL DATA: Initial treatment strategy for right parotid adenocarcinoma status post parotidectomy.  EXAM: NUCLEAR MEDICINE PET SKULL BASE TO THIGH  TECHNIQUE: 7.69 mCi F-18 FDG was injected intravenously. Full-ring PET imaging was performed from the skull base to thigh after the radiotracer. CT data was obtained and used for attenuation correction and anatomic localization.  FASTING  BLOOD GLUCOSE: Value: 108 mg/dl  COMPARISON: Neck CT 08/24/2014. Chest CT 08/27/2014.  FINDINGS: NECK  No hypermetabolic cervical lymph nodes are identified.There are no lesions of the pharyngeal mucosal space. There is postsurgical activity superficial to the right parotid gland with an SUV max of 5.0. No focal mass lesion identified.  CHEST  Interval resection of anterior mediastinal mass (benign thyroid tissue at pathology). There are no hypermetabolic mediastinal, hilar or axillary lymph nodes. There is some activity associated with the median sternotomy. There is no suspicious pulmonary activity. 5 mm left upper lobe nodule on image number 16 is stable and without abnormal metabolic activity.  ABDOMEN/PELVIS  There is no  hypermetabolic activity within the liver, adrenal glands, spleen or pancreas. There is no hypermetabolic nodal activity. There are postsurgical changes, including previous cholecystectomy and partial hysterectomy. Mild atherosclerosis noted.  SKELETON  There is no hypermetabolic activity to suggest osseous metastatic disease. Lower lumbar spine facet degenerative changes are noted.  IMPRESSION: 1. Interval resection of right parotid tumor without suspicious residual activity. 2. Interval resection of anterior mediastinal mass. 3. No evidence of metastatic disease or suspicious metabolic activity. 4. Stable 5 mm left upper lobe pulmonary nodule from chest CT of 3 months ago. Fleischner criteria do not apply given the patient's history. Consider follow-up chest CT in 6-12 months.   Electronically Signed  By: Richardean Sale M.D.  On: 12/07/2014 10:25   ASSESSMENT & PLAN:  Adenocarcinoma of the Right Parotid Gland Final pathology of the parotid gland showing an adenocarcinoma, intermediate to focally high grade, nonspecific intercalated duct type, 1.8 cm in greatest dimension, no perineural nor angiolymphatic invasion margins are  negative (second opinion at [facility]) pT1,pNX Right lateral parotidectomy with facial nerve dissection and preservation on 11/16/2014 with Dr. Benjamine Mola Partial sternotomy with excision of mediastinal mass and sternal plating by Dr. Servando Snare on 10/01/2014 Final pathology of mediastinal mass benign thyroid tissue with no evidence of malignancy  Overall she is doing fairly well. She is somewhat overwhelmed by all of the events the last several months she has been very healthy. Pain is improved, redness and swelling are improved.   We reviewed her PET in detail. I have set up an appointment with Dr. Isidore Moos at [facility].  I will see her back in months.  Again, no indication for chemotherapy at this point.  All questions were answered. The patient knows to call the clinic with any problems, questions or concerns.   This document serves as a record of services personally performed by Ancil Linsey, MD. It was created on her behalf by Janace Hoard, a trained medical scribe. The creation of this record is based on the scribe's personal observations and the provider's statements to them. This document has been checked and approved by the attending provider.  I have reviewed the above documentation for accuracy and completeness, and I agree with the above.  This note was electronically signed.    Kelby Fam. Whitney Muse, MD

## 2014-12-10 ENCOUNTER — Telehealth: Payer: Self-pay | Admitting: *Deleted

## 2014-12-10 NOTE — Telephone Encounter (Signed)
  Oncology Nurse Navigator Documentation Referral date to RadOnc/MedOnc: 12/10/14 (12/10/14 1201) Navigator Encounter Type: Introductory phone call (12/10/14 1201)     Placed introductory call to new referral patient. 1. Introduced myself as the oncology nurse navigator that works with Dr. Isidore Moos to whom she has been referred by Dr. Whitney Muse and with whom she has an appt 9/28 9:30/10:00. 2. She confirmed understanding of referral and appt date/time.  She stated her husband will be joining her. 3. I briefly explained my role as a navigator, indicated that I would be joining her during the appt.   4. I confirmed understanding of the [facility] location, explained arrival and RadOnc registration process for appt. 5. I provided my contact information, encouraged her to call with questions/concerns before next week. 6. She verbalized understanding of information provided, expressed appreciation for my call.  Gayleen [place], RN, BSN, [place] [place] [phone]                       Time Spent with Patient: 15 (12/10/14 1201)

## 2014-12-13 NOTE — Progress Notes (Signed)
Head and Neck Cancer Location of Tumor / Histology: Adenocarcinoma of the Right Parotid Gland  Patient presented per Dr. Imagene Gurney when "she saw her primary care physician in March for an eye infection. She had a small palpable mass in the right cheek at that visit. She was given antibiotics but states the area in the cheek did not improve. "  Biopsies revealed:   11/16/14 Diagnosis Parotid gland, Right - ADENOCARCINOMA, INTERMEDIATE TO FOCALLY HIGH GRADE, NONSPECIFIC INTERCALATED DUCT TYPE (ADENOCARCINOMA, NOT OTHERWISE SPECIFIED). - TUMOR MEASURES 1.8 CM IN GREATEST DIMENSION. - NO PERINEURAL OR ANGIOLYMPHATIC INVASION IDENTIFIED. - MARGINS ARE NEGATIVE. - SEE ONCOLOGY TEMPLATE.  Nutrition Status Yes No Comments  Weight changes? [x]  []  She has lost approximately 4 lbs since August  Swallowing concerns? []  [x]    PEG? []  [x]     Referrals Yes No Comments  Social Work? []  [x]    Dentistry? []  [x]    Swallowing therapy? []  [x]    Nutrition? []  [x]    Med/Onc? [x]  []  Dr. Whitney Muse   Safety Issues Yes No Comments  Prior radiation? []  [x]    Pacemaker/ICD? []  [x]    Possible current pregnancy? []  [x]    Is the patient on methotrexate? []  [x]     Tobacco/Marijuana/Snuff/ETOH use: never has smoked.  Drinks ETOH occasionally.  Past/Anticipated interventions by otolaryngology, if any: 11/16/14 - Procedure: RIGHT PAROTIDECTOMY;  Surgeon: Leta Baptist, MD;  Location: [place];  Service: ENT;  Laterality: Right;  Past/Anticipated interventions by medical oncology, if any: none - depends on PET CT results.  Current Complaints / other details:  She has some resolving redness at surgical site to right upper neck.  Wt Readings from Last 3 Encounters:  12/15/14 159 lb 6.4 oz (72.303 kg)  12/09/14 156 lb 14.4 oz (71.169 kg)  11/30/14 160 lb 6.4 oz (72.757 kg)

## 2014-12-15 ENCOUNTER — Encounter: Payer: Self-pay | Admitting: *Deleted

## 2014-12-15 ENCOUNTER — Ambulatory Visit
Admission: RE | Admit: 2014-12-15 | Discharge: 2014-12-15 | Disposition: A | Discharge status: Home / Self Care | Payer: 59 | Source: Ambulatory Visit | Attending: Radiation Oncology | Admitting: Radiation Oncology

## 2014-12-15 VITALS — BP 110/71 | HR 77 | Temp 97.6°F | Ht 62.0 in | Wt 159.4 lb

## 2014-12-15 DIAGNOSIS — C07 Malignant neoplasm of parotid gland: Secondary | ICD-10-CM

## 2014-12-15 DIAGNOSIS — F1010 Alcohol abuse, uncomplicated: Secondary | ICD-10-CM | POA: Diagnosis not present

## 2014-12-15 DIAGNOSIS — Z833 Family history of diabetes mellitus: Secondary | ICD-10-CM | POA: Diagnosis not present

## 2014-12-15 DIAGNOSIS — Z510 Encounter for antineoplastic radiation therapy: Secondary | ICD-10-CM | POA: Diagnosis present

## 2014-12-15 DIAGNOSIS — Z8249 Family history of ischemic heart disease and other diseases of the circulatory system: Secondary | ICD-10-CM | POA: Diagnosis not present

## 2014-12-15 NOTE — Progress Notes (Signed)
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 Malignant tumor of parotid gland 142.0 C07     HISTORY OF PRESENT ILLNESS::[name] is a 74 y.o. female who presented with a palpable lump in the right parotid region. Diagnosis was somewhat delayed by an incidental anterior mediastinal found on imaging, prompting thoracic surgery. CT imaging in June revealed a 73mm right parotid mass as well at the anterior mediastinal mass. The mediastinal mass tissue, upon path review post resection, was benign thyroid tissue.  Resection of the neck mass on 11-16-14  By Dr. Benjamine Mola, in the form of a right lateral parotidectomy with facial nerve dissection and preservation, revealed a 1.8 cm tumor, adenocarcinoma, int to high grade, no PNI or LVSI, notable for extremely close margins. She was discussed at ENT tumor board. No nodes were removed.   Post operative PET scan on 12-07-14 was negative for any residual or metastatic disease. She had a 23mm pulmonary nodule that is stable from CT done 3 months ago.  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  She is otherwise in her USOH.   PATHOLOGY:  Parotid gland, Right - ADENOCARCINOMA, INTERMEDIATE TO FOCALLY HIGH GRADE, NONSPECIFIC INTERCALATED DUCT TYPE (ADENOCARCINOMA, NOT OTHERWISE SPECIFIED). - TUMOR MEASURES 1.8 CM IN GREATEST DIMENSION. - NO PERINEURAL OR ANGIOLYMPHATIC INVASION IDENTIFIED. - MARGINS ARE NEGATIVE. - SEE ONCOLOGY TEMPLATE. Microscopic Comment ONCOLOGY TABLE - SALIVARY GLAND 1. Specimen: Right  parotid gland. 2. Procedure: Right lateral parotidectomy. 3. Tumor site and laterality: Right parotid. 4. Tumor focality: Unifocal. 5. Maximum tumor size (cm): 1.8 cm (gross measurement). 6. Histologic type: Adenocarcinoma, nonspecific intercalated duct type. 7. Grade: Intermediate to focally high grade. 8. Margins: Negative. Distance of tumor from closest margin: Extremely close (less than 0.1 cm). 9. Perineural invasion: Not identified. 10. Lymph-Vascular invasion: Not identified. 11. Lymph nodes: No lymph nodes received. 12. TNM code: pT1, pNX.    PREVIOUS RADIATION THERAPY: No  PAST MEDICAL HISTORY:  has a past medical history of Paroxysmal atrial fibrillation; HTN (hypertension); DJD (degenerative joint disease); Low TSH level (01/2010); Neuromuscular disorder; and PONV (postoperative nausea and vomiting).    PAST SURGICAL HISTORY: Past Surgical History  Procedure Laterality Date  . Vein ligation and stripping    . Appendectomy    . Cholecystectomy    . Abdominal hysterectomy      w/o oophorectomy  . Bunionectomy      bilateral  . Tonsillectomy    . Tonsillectomy    . Esophagogastroduodenoscopy  12/27/2011    Procedure: ESOPHAGOGASTRODUODENOSCOPY (EGD);  Surgeon: Rogene Houston, MD;  [facility];  Service: Endoscopy;  Laterality: N/A;  250  . Resection of mediastinal mass N/A 10/01/2014    Procedure: RESECTION OF MEDIASTINAL MASS;  Surgeon: Grace Isaac, MD;  Location: [facility];  Service: Thoracic;  Laterality: N/A;  . Sternotomy N/A 10/01/2014    Procedure: STERNOTOMY;  Surgeon: Grace Isaac, MD;  Location: [place];  Service: Thoracic;  Laterality: N/A;  . Parotidectomy Right 11/16/2014    Procedure: RIGHT PAROTIDECTOMY;  Surgeon: Leta Baptist, MD;  Location: [facility];  Service: ENT;  Laterality: Right;    FAMILY HISTORY: family history includes Diabetes in her mother; Heart disease in her father; Hypertension in her father and  mother.  SOCIAL HISTORY:  reports that she has never smoked. She has never used smokeless tobacco. She reports that she drinks alcohol. She reports that she does not use illicit drugs.  ALLERGIES: Penicillins and Sulfonamide derivatives  MEDICATIONS:  Current Outpatient Prescriptions  Medication Sig Dispense Refill  . lisinopril-hydrochlorothiazide (PRINZIDE,ZESTORETIC) 10-12.5 MG per tablet Take 1 tablet by mouth daily. 90 tablet 1  . metoprolol tartrate (LOPRESSOR) 25 MG tablet Take 1 tablet (25 mg total) by mouth 2 (two) times daily. 180 tablet 1  . Multiple Vitamins-Minerals (HAIR/SKIN/NAILS PO) Take 1 tablet by mouth daily.    . potassium chloride (K-DUR) 10 MEQ tablet Take 10 mEq by mouth daily.    . ranitidine (ZANTAC) 150 MG tablet Take 150 mg by mouth at bedtime.    Marland Kitchen zinc gluconate 50 MG tablet Take 50 mg by mouth daily.      Marland Kitchen oxyCODONE-acetaminophen (ROXICET) 5-325 MG per tablet Take 1-2 tablets by mouth every 4 (four) hours as needed for severe pain. (Patient not taking: Reported on 12/09/2014) 30 tablet 0   No current facility-administered medications for this encounter.    REVIEW OF SYSTEMS:  Notable for that above.   PHYSICAL EXAM:  height is 5\' 2"  (1.575 m) and weight is 159 lb 6.4 oz (72.303 kg). Her temperature is 97.6 F (36.4 C). Her blood pressure is 110/71 and her pulse is 77. Her oxygen saturation is 98%.   General: Alert and oriented, in no acute distress HEENT: Head is normocephalic. Extraocular movements are intact. Oropharynx tissue is normal. Neck: Neck is notable for erythematous skin in the right pre auricular region, scar healing. Numbness of right earlobe. Heart: Regular in rate and rhythm with a soft murmur in the aortic region Chest: Clear to auscultation bilaterally, with no rhonchi, wheezes, or rales. Abdomen: Soft, nontender, nondistended, with no rigidity or guarding. Extremities: No cyanosis or edema. Lymphatics: see Neck Exam Skin: No concerning  lesions. Musculoskeletal: symmetric strength and muscle tone throughout. Neurologic: Cranial nerves II through XII are grossly intact. No obvious focalities. Speech is fluent. Coordination is intact. Psychiatric: Judgment and insight are intact. Affect is appropriate.   ECOG = 1  0 - Asymptomatic (Fully active, able to carry on all predisease activities without restriction)  1 - Symptomatic but completely ambulatory (Restricted in physically strenuous activity but ambulatory and able to carry out work of a light or sedentary nature. For example, light housework, office work)  2 - Symptomatic, <50% in bed during the day (Ambulatory and capable of all self care but unable to carry out any work activities. Up and about more than 50% of waking hours)  3 - Symptomatic, >50% in bed, but not bedbound (Capable of only limited self-care, confined to bed or chair 50% or more of waking hours)  4 - Bedbound (Completely disabled. Cannot carry on any self-care. Totally confined to bed or chair)  5 - Death   Eustace Pen MM, Creech RH, Tormey DC, et al. [phone]). "Toxicity and response criteria of the [facility]". [place] Oncol. 5 (6): 649-55   LABORATORY DATA:  Lab Results  Component Value Date   WBC 13.4* 10/03/2014   HGB 13.0 11/16/2014   HCT 33.7* 10/03/2014   MCV 88.5 10/03/2014  PLT 161 10/03/2014   CMP     Component Value Date/Time   NA 137 11/15/2014 1400   K 3.0* 11/15/2014 1400   CL 102 11/15/2014 1400   CO2 27 11/15/2014 1400   GLUCOSE 113* 11/15/2014 1400   BUN 14 11/15/2014 1400   CREATININE 0.70 11/15/2014 1400   CALCIUM 8.9 11/15/2014 1400   PROT 6.1* 10/03/2014 0420   ALBUMIN 2.7* 10/03/2014 0420   AST 27 10/03/2014 0420   ALT 44 10/03/2014 0420   ALKPHOS 47 10/03/2014 0420   BILITOT 1.4* 10/03/2014 0420   GFRNONAA >60 11/15/2014 1400   GFRAA >60 11/15/2014 1400         RADIOGRAPHY: Nm Pet Image Initial (pi) Skull Base To Thigh  12/07/2014    CLINICAL DATA:  Initial treatment strategy for right parotid adenocarcinoma status post parotidectomy.  EXAM: NUCLEAR MEDICINE PET SKULL BASE TO THIGH  TECHNIQUE: 7.69 mCi F-18 FDG was injected intravenously. Full-ring PET imaging was performed from the skull base to thigh after the radiotracer. CT data was obtained and used for attenuation correction and anatomic localization.  FASTING BLOOD GLUCOSE:  Value: 108 mg/dl  COMPARISON:  Neck CT 08/24/2014.  Chest CT 08/27/2014.  FINDINGS: NECK  No hypermetabolic cervical lymph nodes are identified.There are no lesions of the pharyngeal mucosal space. There is postsurgical activity superficial to the right parotid gland with an SUV max of 5.0. No focal mass lesion identified.  CHEST  Interval resection of anterior mediastinal mass (benign thyroid tissue at pathology). There are no hypermetabolic mediastinal, hilar or axillary lymph nodes. There is some activity associated with the median sternotomy. There is no suspicious pulmonary activity. 5 mm left upper lobe nodule on image number 16 is stable and without abnormal metabolic activity.  ABDOMEN/PELVIS  There is no hypermetabolic activity within the liver, adrenal glands, spleen or pancreas. There is no hypermetabolic nodal activity. There are postsurgical changes, including previous cholecystectomy and partial hysterectomy. Mild atherosclerosis noted.  SKELETON  There is no hypermetabolic activity to suggest osseous metastatic disease. Lower lumbar spine facet degenerative changes are noted.  IMPRESSION: 1. Interval resection of right parotid tumor without suspicious residual activity. 2. Interval resection of anterior mediastinal mass. 3. No evidence of metastatic disease or suspicious metabolic activity. 4. Stable 5 mm left upper lobe pulmonary nodule from chest CT of 3 months ago. Fleischner criteria do not apply given the patient's history. Consider follow-up chest CT in 6-12 months.   Electronically Signed   By:  Richardean Sale M.D.   On: 12/07/2014 10:25      IMPRESSION/PLAN:  This is a delightful patient with parotid cancer. Adjuvuant risk factors include intermediate/high grade and close margins. I do recommend radiotherapy for this patient.  I think that the highest risk region for locoregional recurrence is the parotid bed; we talked about the lesser risk tissues in the right cervical nodal echelons.  Because I have concerns that empiric treatment of the nodal echelons could cause significantly worse side effects, and because the tumor was negative for LVSI,  I recommend treatment to the parotid region only.  We discussed the potential risks, benefits, and side effects of radiotherapy. We talked in detail about acute and late effects. We discussed that some of the most bothersome acute effects may be   salivary changes, taste changes, skin irritation, hair loss,  fatigue. We talked about late effects which include but are not necessarily limited to  xerostomia and tissue injury in the radiation fields. No  guarantees of treatment were given. A consent form was signed and placed in the patient's medical record. The patient is enthusiastic about proceeding with treatment. I look forward to participating in the patient's care.    Simulation (treatment planning) will take place in the near future. Anticipate 6 weeks of treatment.  __________________________________________   Eppie Gibson, MD

## 2014-12-16 NOTE — Progress Notes (Signed)
  Oncology Nurse Navigator Documentation   Navigator Encounter Type: Initial RadOnc (12/15/14 0955)     Barriers/Navigation Needs: No barriers at this time;Education (12/15/14 0955)      Met with patient during initial consult with Dr. Isidore Moos.  She was accompanied by her husband.  1. Further introduced myself as her Navigator, explained my role as a member of the Care Team.   2. Provided New Patient Information packet, discussed contents:  Contact information for physician(s), myself, other members of the Care Team.  Advance Directive information ([place] blue pamphlet with LCSW contact info)  Fall Prevention Patient Safety Plan  Appointment Guideline  ACS Referral form  [facility] map with highlight of [place] 3. Provided introductory explanation of radiation treatment including SIM planning and purpose of Aquaplast head and shoulder mask, showed them example.   4. Provided a tour of SIM and [place] areas, explained treatment and arrival procedures. 5. I encouraged them to contact me with questions/concerns as treatments/procedures begin.  6. Later notified RadOnc Financial Counselor, [place], to call patient to answer insurance questions.   Gayleen Orem, RN, BSN, [place] at [place] [phone]              Time Spent with Patient: 75 (12/15/14 0955)

## 2014-12-22 ENCOUNTER — Encounter: Payer: Self-pay | Admitting: *Deleted

## 2014-12-22 ENCOUNTER — Ambulatory Visit
Admission: RE | Admit: 2014-12-22 | Discharge: 2014-12-22 | Disposition: A | Discharge status: Home / Self Care | Payer: 59 | Source: Ambulatory Visit | Attending: Radiation Oncology | Admitting: Radiation Oncology

## 2014-12-22 DIAGNOSIS — Z510 Encounter for antineoplastic radiation therapy: Secondary | ICD-10-CM | POA: Diagnosis not present

## 2014-12-22 DIAGNOSIS — C07 Malignant neoplasm of parotid gland: Secondary | ICD-10-CM

## 2014-12-22 NOTE — Progress Notes (Addendum)
  Radiation Oncology         [phone] ________________________________  Name: [name] MRN: [id number]  Date: 12/22/2014  DOB: 08-Sep-1940  SIMULATION AND TREATMENT PLANNING NOTE  Outpatient  DIAGNOSIS:     ICD-9-CM ICD-10-CM   1. Malignant tumor of parotid gland ([place]) 142.0 C07     NARRATIVE:  The patient was brought to the [facility] suite.  Identity was confirmed.  All relevant records and images related to the planned course of therapy were reviewed.  The patient freely provided informed written consent to proceed with treatment after reviewing the details related to the planned course of therapy. The consent form was witnessed and verified by the simulation staff.    Then, the patient was set-up in a stable reproducible  supine position for radiation therapy.  A custom aquaplast head mask was fabricated. CT images were obtained.  Surface markings were placed.  The CT images were loaded into the planning software.    TREATMENT PLANNING NOTE: Treatment planning then occurred.  The radiation prescription was entered and confirmed.    A total of 5 medically necessary complex treatment devices were fabricated and supervised by me (customized face mask for immobilization and 4 fields with MLCs to block oral cavity, cochlea, cord ).   I have requested : 3D Plan with DVH of target volume, oral cavity, cochlea, cord. The patient will receive 60 Gy in 30 fractions to the right parotid bed.   -----------------------------------  Eppie Gibson, MD

## 2014-12-23 ENCOUNTER — Ambulatory Visit (INDEPENDENT_AMBULATORY_CARE_PROVIDER_SITE_OTHER): Payer: 59 | Admitting: Internal Medicine

## 2014-12-23 ENCOUNTER — Telehealth (INDEPENDENT_AMBULATORY_CARE_PROVIDER_SITE_OTHER): Payer: Self-pay | Admitting: *Deleted

## 2014-12-23 ENCOUNTER — Encounter (INDEPENDENT_AMBULATORY_CARE_PROVIDER_SITE_OTHER): Payer: Self-pay | Admitting: Internal Medicine

## 2014-12-23 ENCOUNTER — Other Ambulatory Visit (INDEPENDENT_AMBULATORY_CARE_PROVIDER_SITE_OTHER): Payer: Self-pay | Admitting: Internal Medicine

## 2014-12-23 VITALS — BP 104/80 | HR 60 | Temp 98.0°F | Ht 62.0 in | Wt 159.0 lb

## 2014-12-23 DIAGNOSIS — Z1211 Encounter for screening for malignant neoplasm of colon: Secondary | ICD-10-CM

## 2014-12-23 DIAGNOSIS — K219 Gastro-esophageal reflux disease without esophagitis: Secondary | ICD-10-CM

## 2014-12-23 NOTE — Patient Instructions (Signed)
Continue Zantac. Will schedule a colonoscopy

## 2014-12-23 NOTE — Telephone Encounter (Signed)
Patient needs trilyte 

## 2014-12-23 NOTE — Progress Notes (Signed)
Subjective:    Patient ID: [name], female    DOB: 1940-05-23, 74 y.o.   MRN: [id number]  HPI Here today for f/u of her GERD. She was last seen 12/21/2013.   Recent hx of adenocarcinoma of the rt parotid gland.  On 11/16/2014, she underwent rt lateral parotidectomy with facial nerve dissection and preservation. Will start radiation next week (30 tx) ON 10/01/2014 she underwent a partial sternotomy with excision of mediastinal mass and sternal plating by Dr. Servando Snare. Biopsy: benign thyroid tissue with no evidence of malignancy.  Appetite is good. No weight loss. Acid reflux controlled with Zantac at night. BMs are normal. No melena or BRRB.   02/16/2005 Screening colonoscopy: Small sigmoid polyp Prominent anal papilla.   Biopsy 1) Sigmoid colon:Performed on multiple levels.  DIAGNOSIS: 1) polyp (Sigmoid colon, polypectomy):  BENIGN FRAGMENT OF COLONIC MUCOSA EXHIBITING MILD INFLAMMATORY  CHANGE. 12/27/2011 EGD: Impression:  Small sliding hiatal hernia otherwise normal EGD.  Review of Systems Past Medical History  Diagnosis Date  . Paroxysmal atrial fibrillation (HCC)     normal echiocardiogram and stress nuclear; low TSH with normal T3 and T4   . HTN (hypertension)   . 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 Low TSH level 01/2010  . Neuromuscular disorder ([place])   . PONV (postoperative nausea and vomiting)     Past Surgical History  Procedure Laterality Date  . Vein ligation and stripping    . Appendectomy    . Cholecystectomy    . Abdominal hysterectomy      w/o oophorectomy  . Bunionectomy      bilateral  . Tonsillectomy    . Tonsillectomy    . Esophagogastroduodenoscopy  12/27/2011    Procedure: ESOPHAGOGASTRODUODENOSCOPY (EGD);  Surgeon: Rogene Houston, MD;  [facility];  Service: Endoscopy;  Laterality: N/A;  250  . Resection of mediastinal mass N/A 10/01/2014    Procedure: RESECTION OF  MEDIASTINAL MASS;  Surgeon: Grace Isaac, MD;  Location: [facility];  Service: Thoracic;  Laterality: N/A;  . Sternotomy N/A 10/01/2014    Procedure: STERNOTOMY;  Surgeon: Grace Isaac, MD;  Location: [place] [place];  Service: Thoracic;  Laterality: N/A;  . Parotidectomy Right 11/16/2014    Procedure: RIGHT PAROTIDECTOMY;  Surgeon: Leta Baptist, MD;  Location: [facility];  Service: ENT;  Laterality: Right;    Allergies  Allergen Reactions  . Penicillins Other (See Comments)    Unknown   . Sulfonamide Derivatives Other (See Comments)    Leg Pain    Current Outpatient Prescriptions on File Prior to Visit  Medication Sig Dispense Refill  . lisinopril-hydrochlorothiazide (PRINZIDE,ZESTORETIC) 10-12.5 MG per tablet Take 1 tablet by mouth daily. 90 tablet 1  . metoprolol tartrate (LOPRESSOR) 25 MG tablet Take 1 tablet (25 mg total) by mouth 2 (two) times daily. 180 tablet 1  . Multiple Vitamins-Minerals (HAIR/SKIN/NAILS PO) Take 1 tablet by mouth daily.    . potassium chloride (K-DUR) 10 MEQ tablet Take 10 mEq by mouth daily.    . ranitidine (ZANTAC) 150 MG tablet Take 150 mg by mouth at bedtime.    Marland Kitchen zinc gluconate 50 MG tablet Take 50 mg by mouth daily.       No current facility-administered medications on file prior to visit.        Objective:   Physical Exam Blood pressure 104/80, pulse 60, temperature 98 F (36.7 C), height 5'  2" (1.575 m), weight 159 lb (72.122 kg). Alert and oriented. Skin warm and dry. Oral mucosa is moist.   . Sclera anicteric, conjunctivae is pink. Thyroid not enlarged. No cervical lymphadenopathy. Lungs clear. Heart regular rate and rhythm.  Abdomen is soft. Bowel sounds are positive. No hepatomegaly. No abdominal masses felt. No tenderness.  No edema to lower extremities.          Assessment & Plan:  GERD controlled at this time with Zantac. Will schedule a colonoscopy. She will be due in December.

## 2014-12-24 DIAGNOSIS — Z510 Encounter for antineoplastic radiation therapy: Secondary | ICD-10-CM | POA: Diagnosis not present

## 2014-12-24 MED ORDER — PEG 3350-KCL-NA BICARB-NACL 420 G PO SOLR: 4000.0000 mL | Freq: Once | ORAL | Status: DC

## 2014-12-24 NOTE — Progress Notes (Signed)
  Oncology Nurse Navigator Documentation   Navigator Encounter Type: Other (12/22/14 1505) Patient Visit Type: Radonc (12/22/14 1505) Treatment Phase: CT SIM (12/22/14 1505)     To provide support and encouragement, care continuity and to assess for needs, met with patient prior to CT SIM.  Her husband accompanied her. She had moment of tears, explained "I've been doing OK but it just all of the sudden got to me".  RTT Heather and I provided support.  She refused my offer of a mentor, "I'll be all right". They understand I can be contacted with future needs/concerns.  Gayleen Orem, RN, BSN, [place] at [place] [phone]                   Time Spent with Patient: 15 (12/22/14 1505)

## 2014-12-27 DIAGNOSIS — Z510 Encounter for antineoplastic radiation therapy: Secondary | ICD-10-CM | POA: Diagnosis not present

## 2014-12-29 ENCOUNTER — Ambulatory Visit
Admission: RE | Admit: 2014-12-29 | Discharge: 2014-12-29 | Disposition: A | Discharge status: Home / Self Care | Payer: 59 | Source: Ambulatory Visit | Attending: Radiation Oncology | Admitting: Radiation Oncology

## 2014-12-29 DIAGNOSIS — Z510 Encounter for antineoplastic radiation therapy: Secondary | ICD-10-CM | POA: Diagnosis not present

## 2014-12-30 ENCOUNTER — Ambulatory Visit
Admission: RE | Admit: 2014-12-30 | Discharge: 2014-12-30 | Disposition: A | Discharge status: Home / Self Care | Payer: 59 | Source: Ambulatory Visit | Attending: Radiation Oncology | Admitting: Radiation Oncology

## 2014-12-30 ENCOUNTER — Encounter: Payer: Self-pay | Admitting: Radiation Oncology

## 2014-12-30 ENCOUNTER — Ambulatory Visit: Payer: 59 | Admitting: Radiation Oncology

## 2014-12-30 DIAGNOSIS — Z510 Encounter for antineoplastic radiation therapy: Secondary | ICD-10-CM | POA: Diagnosis not present

## 2014-12-30 DIAGNOSIS — C07 Malignant neoplasm of parotid gland: Secondary | ICD-10-CM | POA: Diagnosis not present

## 2014-12-31 ENCOUNTER — Ambulatory Visit
Admission: RE | Admit: 2014-12-31 | Discharge: 2014-12-31 | Disposition: A | Discharge status: Home / Self Care | Payer: 59 | Source: Ambulatory Visit | Attending: Radiation Oncology | Admitting: Radiation Oncology

## 2014-12-31 DIAGNOSIS — C07 Malignant neoplasm of parotid gland: Secondary | ICD-10-CM | POA: Diagnosis not present

## 2014-12-31 DIAGNOSIS — Z510 Encounter for antineoplastic radiation therapy: Secondary | ICD-10-CM | POA: Diagnosis not present

## 2015-01-03 ENCOUNTER — Ambulatory Visit
Admission: RE | Admit: 2015-01-03 | Discharge: 2015-01-03 | Disposition: A | Discharge status: Home / Self Care | Payer: 59 | Source: Ambulatory Visit | Attending: Radiation Oncology | Admitting: Radiation Oncology

## 2015-01-03 ENCOUNTER — Encounter: Payer: Self-pay | Admitting: Radiation Oncology

## 2015-01-03 VITALS — BP 119/68 | HR 71 | Temp 98.0°F | Resp 18 | Wt 160.0 lb

## 2015-01-03 DIAGNOSIS — Z510 Encounter for antineoplastic radiation therapy: Secondary | ICD-10-CM | POA: Diagnosis not present

## 2015-01-03 DIAGNOSIS — C07 Malignant neoplasm of parotid gland: Secondary | ICD-10-CM | POA: Insufficient documentation

## 2015-01-03 MED ORDER — BIAFINE EX EMUL
CUTANEOUS | Status: DC | PRN
  Administered 2015-01-03: 16:00:00 via TOPICAL

## 2015-01-03 MED ORDER — EMOLLIENT BASE EX CREA: TOPICAL_CREAM | CUTANEOUS | Status: DC | PRN

## 2015-01-03 NOTE — Progress Notes (Signed)
Weekly rad txs right parotid, 3/30 completed,  Patient education done, radiation therapy and you book, flyer on H/N acute side effect jennifer Malmfet RN business card, discusses ways to manage side effects,nausea, fatigue, pain, skin irritation, thick saliva difficulty swallowing, hearing difficulty  , dehydration, decreased appetite, mouth and throat pain, weight loss, increase protein in diet, may need to eat 5-6 smaller meals and snacks, drink plenty water, teach back given, no pain or nausea at present,teach back given 3:43 PM BP 119/68 mmHg  Pulse 71  Temp(Src) 98 F (36.7 C) (Oral)  Resp 18  Wt 160 lb (72.576 kg)  SpO2 100%  Wt Readings from Last 3 Encounters:  01/03/15 160 lb (72.576 kg)  12/23/14 159 lb (72.122 kg)  12/15/14 159 lb 6.4 oz (72.303 kg)

## 2015-01-03 NOTE — Progress Notes (Signed)
   Weekly Management Note:  Outpatient    ICD-9-CM ICD-10-CM   1. Malignant tumor of parotid gland (HCC) 142.0 C07 topical emolient (BIAFINE) emulsion    Current Dose:  6 Gy  Projected Dose: 60 Gy   Narrative:  The patient presents for routine under treatment assessment.  CBCT/MVCT images/Port film x-rays were reviewed.  The chart was checked. No new complaints  Physical Findings:  weight is 160 lb (72.576 kg). Her oral temperature is 98 F (36.7 C). Her blood pressure is 119/68 and her pulse is 71. Her respiration is 18 and oxygen saturation is 100%.   Wt Readings from Last 3 Encounters:  01/03/15 160 lb (72.576 kg)  12/23/14 159 lb (72.122 kg)  12/15/14 159 lb 6.4 oz (72.303 kg)   Skin erythematous in post surgical regional right face/neck  Impression:  The patient is tolerating radiotherapy.  Plan:  Continue radiotherapy as planned.   ________________________________   Eppie Gibson, M.D.

## 2015-01-04 ENCOUNTER — Ambulatory Visit
Admission: RE | Admit: 2015-01-04 | Discharge: 2015-01-04 | Disposition: A | Discharge status: Home / Self Care | Payer: 59 | Source: Ambulatory Visit | Attending: Radiation Oncology | Admitting: Radiation Oncology

## 2015-01-04 DIAGNOSIS — Z510 Encounter for antineoplastic radiation therapy: Secondary | ICD-10-CM | POA: Diagnosis not present

## 2015-01-05 ENCOUNTER — Ambulatory Visit
Admission: RE | Admit: 2015-01-05 | Discharge: 2015-01-05 | Disposition: A | Discharge status: Home / Self Care | Payer: 59 | Source: Ambulatory Visit | Attending: Radiation Oncology | Admitting: Radiation Oncology

## 2015-01-05 DIAGNOSIS — Z510 Encounter for antineoplastic radiation therapy: Secondary | ICD-10-CM | POA: Diagnosis not present

## 2015-01-06 ENCOUNTER — Ambulatory Visit
Admission: RE | Admit: 2015-01-06 | Discharge: 2015-01-06 | Disposition: A | Discharge status: Home / Self Care | Payer: 59 | Source: Ambulatory Visit | Attending: Radiation Oncology | Admitting: Radiation Oncology

## 2015-01-06 DIAGNOSIS — Z510 Encounter for antineoplastic radiation therapy: Secondary | ICD-10-CM | POA: Diagnosis not present

## 2015-01-07 ENCOUNTER — Ambulatory Visit
Admission: RE | Admit: 2015-01-07 | Discharge: 2015-01-07 | Disposition: A | Discharge status: Home / Self Care | Payer: 59 | Source: Ambulatory Visit | Attending: Radiation Oncology | Admitting: Radiation Oncology

## 2015-01-07 DIAGNOSIS — C07 Malignant neoplasm of parotid gland: Secondary | ICD-10-CM | POA: Diagnosis not present

## 2015-01-07 DIAGNOSIS — Z510 Encounter for antineoplastic radiation therapy: Secondary | ICD-10-CM | POA: Diagnosis not present

## 2015-01-10 ENCOUNTER — Ambulatory Visit
Admission: RE | Admit: 2015-01-10 | Discharge: 2015-01-10 | Disposition: A | Discharge status: Home / Self Care | Payer: 59 | Source: Ambulatory Visit | Attending: Radiation Oncology | Admitting: Radiation Oncology

## 2015-01-10 ENCOUNTER — Encounter: Payer: Self-pay | Admitting: Radiation Oncology

## 2015-01-10 VITALS — BP 134/76 | HR 67 | Temp 97.4°F | Ht 62.0 in | Wt 159.5 lb

## 2015-01-10 DIAGNOSIS — C07 Malignant neoplasm of parotid gland: Secondary | ICD-10-CM

## 2015-01-10 DIAGNOSIS — Z510 Encounter for antineoplastic radiation therapy: Secondary | ICD-10-CM | POA: Diagnosis not present

## 2015-01-10 NOTE — Progress Notes (Signed)
   Weekly Management Note:  Outpatient    ICD-9-CM ICD-10-CM   1. Malignant tumor of parotid gland (HCC) 142.0 C07     Current Dose:  14 Gy  Projected Dose: 60 Gy   Narrative:  The patient presents for routine under treatment assessment.  CBCT/MVCT images/Port film x-rays were reviewed.  The chart was checked. No new complaints - no taste issues ; Seen prior to tx  Physical Findings:  height is 5\' 2"  (1.575 m) and weight is 159 lb 8 oz (72.349 kg). Her temperature is 97.4 F (36.3 C). Her blood pressure is 134/76 and her pulse is 67.   Wt Readings from Last 3 Encounters:  01/10/15 159 lb 8 oz (72.349 kg)  01/03/15 160 lb (72.576 kg)  12/23/14 159 lb (72.122 kg)   Skin erythematous in post surgical regional right face/neck. Oral cavity moist, no thrush  Impression:  The patient is tolerating radiotherapy.  Plan:  Continue radiotherapy as planned.   ________________________________   Eppie Gibson, M.D.

## 2015-01-10 NOTE — Progress Notes (Signed)
Ms. Schneeberger is here for her 7th fraction of radiation to her Right Parotid gland. She reports she has a good energy level and is still working at this time. Her skin is red below her earlobe, and there is some hyperpigmentation noted below her ear lobe also. She is using the biafine as directed. She denies any problems swallowing and reports her saliva is still present. She has not modified her diet at this time.   BP 134/76 mmHg  Pulse 67  Temp(Src) 97.4 F (36.3 C)  Ht 5\' 2"  (1.575 m)  Wt 159 lb 8 oz (72.349 kg)  BMI 29.17 kg/m2   Wt Readings from Last 3 Encounters:  01/10/15 159 lb 8 oz (72.349 kg)  01/03/15 160 lb (72.576 kg)  12/23/14 159 lb (72.122 kg)

## 2015-01-11 ENCOUNTER — Ambulatory Visit
Admission: RE | Admit: 2015-01-11 | Discharge: 2015-01-11 | Disposition: A | Discharge status: Home / Self Care | Payer: 59 | Source: Ambulatory Visit | Attending: Radiation Oncology | Admitting: Radiation Oncology

## 2015-01-11 DIAGNOSIS — Z510 Encounter for antineoplastic radiation therapy: Secondary | ICD-10-CM | POA: Diagnosis not present

## 2015-01-12 ENCOUNTER — Ambulatory Visit
Admission: RE | Admit: 2015-01-12 | Discharge: 2015-01-12 | Disposition: A | Discharge status: Home / Self Care | Payer: 59 | Source: Ambulatory Visit | Attending: Radiation Oncology | Admitting: Radiation Oncology

## 2015-01-12 DIAGNOSIS — Z510 Encounter for antineoplastic radiation therapy: Secondary | ICD-10-CM | POA: Diagnosis not present

## 2015-01-12 DIAGNOSIS — C07 Malignant neoplasm of parotid gland: Secondary | ICD-10-CM | POA: Diagnosis not present

## 2015-01-13 ENCOUNTER — Ambulatory Visit
Admission: RE | Admit: 2015-01-13 | Discharge: 2015-01-13 | Disposition: A | Discharge status: Home / Self Care | Payer: 59 | Source: Ambulatory Visit | Attending: Radiation Oncology | Admitting: Radiation Oncology

## 2015-01-13 DIAGNOSIS — C07 Malignant neoplasm of parotid gland: Secondary | ICD-10-CM | POA: Diagnosis not present

## 2015-01-13 DIAGNOSIS — Z510 Encounter for antineoplastic radiation therapy: Secondary | ICD-10-CM | POA: Diagnosis not present

## 2015-01-14 ENCOUNTER — Ambulatory Visit
Admission: RE | Admit: 2015-01-14 | Discharge: 2015-01-14 | Disposition: A | Discharge status: Home / Self Care | Payer: 59 | Source: Ambulatory Visit | Attending: Radiation Oncology | Admitting: Radiation Oncology

## 2015-01-14 DIAGNOSIS — Z510 Encounter for antineoplastic radiation therapy: Secondary | ICD-10-CM | POA: Diagnosis not present

## 2015-01-16 ENCOUNTER — Ambulatory Visit: Payer: 59

## 2015-01-17 ENCOUNTER — Ambulatory Visit
Admission: RE | Admit: 2015-01-17 | Discharge: 2015-01-17 | Disposition: A | Discharge status: Home / Self Care | Payer: 59 | Source: Ambulatory Visit | Attending: Radiation Oncology | Admitting: Radiation Oncology

## 2015-01-17 ENCOUNTER — Encounter: Payer: Self-pay | Admitting: Radiation Oncology

## 2015-01-17 ENCOUNTER — Ambulatory Visit: Payer: 59

## 2015-01-17 VITALS — BP 128/81 | HR 68 | Temp 97.4°F | Ht 62.0 in | Wt 160.9 lb

## 2015-01-17 DIAGNOSIS — C07 Malignant neoplasm of parotid gland: Secondary | ICD-10-CM

## 2015-01-17 DIAGNOSIS — Z510 Encounter for antineoplastic radiation therapy: Secondary | ICD-10-CM | POA: Diagnosis not present

## 2015-01-17 NOTE — Progress Notes (Signed)
   Weekly Management Note:  Outpatient    ICD-9-CM ICD-10-CM   1. Malignant tumor of parotid gland (HCC) 142.0 C07     Current Dose:  26 Gy  Projected Dose: 60 Gy   Narrative:  The patient presents for routine under treatment assessment.  CBCT/MVCT images/Port film x-rays were reviewed.  The chart was checked.  Sore mild soreness, itching in tx area. Doing well  Physical Findings:  height is 5\' 2"  (1.575 m) and weight is 160 lb 14.4 oz (72.984 kg). Her temperature is 97.4 F (36.3 C). Her blood pressure is 128/81 and her pulse is 68.   Wt Readings from Last 3 Encounters:  01/17/15 160 lb 14.4 oz (72.984 kg)  01/10/15 159 lb 8 oz (72.349 kg)  01/03/15 160 lb (72.576 kg)   Skin erythematous, intact in post surgical regional right face/neck.   Impression:  The patient is tolerating radiotherapy.  Plan:  Continue radiotherapy as planned. Continue current skin care +/- hydrocortisone 1% cream   ________________________________   Eppie Gibson, M.D.

## 2015-01-17 NOTE — Progress Notes (Signed)
[name] is here for her 13th fraction of radiation to her Right Parotid area. She denies pain, but does complain of "soreness" at her Right ear. The area has mild redness and she is using the biafine cream as directed. She states she is eating well and not modifying her diet at this time. Her husband reports encouraging her to eat more protein. She has some mild fatigue, but is still able to complete all her normal activities.   BP 128/81 mmHg  Pulse 68  Temp(Src) 97.4 F (36.3 C)  Ht 5\' 2"  (1.575 m)  Wt 160 lb 14.4 oz (72.984 kg)  BMI 29.42 kg/m2   Wt Readings from Last 3 Encounters:  01/17/15 160 lb 14.4 oz (72.984 kg)  01/10/15 159 lb 8 oz (72.349 kg)  01/03/15 160 lb (72.576 kg)

## 2015-01-18 ENCOUNTER — Ambulatory Visit
Admission: RE | Admit: 2015-01-18 | Discharge: 2015-01-18 | Disposition: A | Discharge status: Home / Self Care | Payer: 59 | Source: Ambulatory Visit | Attending: Radiation Oncology | Admitting: Radiation Oncology

## 2015-01-18 DIAGNOSIS — Z510 Encounter for antineoplastic radiation therapy: Secondary | ICD-10-CM | POA: Diagnosis not present

## 2015-01-19 ENCOUNTER — Ambulatory Visit
Admission: RE | Admit: 2015-01-19 | Discharge: 2015-01-19 | Disposition: A | Discharge status: Home / Self Care | Payer: 59 | Source: Ambulatory Visit | Attending: Radiation Oncology | Admitting: Radiation Oncology

## 2015-01-19 ENCOUNTER — Other Ambulatory Visit: Payer: Self-pay | Admitting: Cardiothoracic Surgery

## 2015-01-19 DIAGNOSIS — Z510 Encounter for antineoplastic radiation therapy: Secondary | ICD-10-CM | POA: Diagnosis not present

## 2015-01-19 DIAGNOSIS — C07 Malignant neoplasm of parotid gland: Secondary | ICD-10-CM | POA: Diagnosis not present

## 2015-01-19 DIAGNOSIS — E049 Nontoxic goiter, unspecified: Secondary | ICD-10-CM

## 2015-01-20 ENCOUNTER — Ambulatory Visit (INDEPENDENT_AMBULATORY_CARE_PROVIDER_SITE_OTHER): Payer: 59 | Admitting: Cardiothoracic Surgery

## 2015-01-20 ENCOUNTER — Ambulatory Visit
Admission: RE | Admit: 2015-01-20 | Discharge: 2015-01-20 | Disposition: A | Discharge status: Home / Self Care | Payer: 59 | Source: Ambulatory Visit | Attending: Radiation Oncology | Admitting: Radiation Oncology

## 2015-01-20 ENCOUNTER — Encounter: Payer: Self-pay | Admitting: *Deleted

## 2015-01-20 ENCOUNTER — Ambulatory Visit
Admission: RE | Admit: 2015-01-20 | Discharge: 2015-01-20 | Disposition: A | Discharge status: Home / Self Care | Payer: 59 | Source: Ambulatory Visit | Attending: Cardiothoracic Surgery | Admitting: Cardiothoracic Surgery

## 2015-01-20 ENCOUNTER — Encounter: Payer: Self-pay | Admitting: Cardiothoracic Surgery

## 2015-01-20 VITALS — BP 135/90 | HR 67 | Resp 16 | Ht 62.0 in | Wt 160.0 lb

## 2015-01-20 DIAGNOSIS — Z09 Encounter for follow-up examination after completed treatment for conditions other than malignant neoplasm: Secondary | ICD-10-CM

## 2015-01-20 DIAGNOSIS — E049 Nontoxic goiter, unspecified: Secondary | ICD-10-CM

## 2015-01-20 DIAGNOSIS — Z510 Encounter for antineoplastic radiation therapy: Secondary | ICD-10-CM | POA: Diagnosis not present

## 2015-01-20 DIAGNOSIS — J9859 Other diseases of mediastinum, not elsewhere classified: Secondary | ICD-10-CM

## 2015-01-20 DIAGNOSIS — I4891 Unspecified atrial fibrillation: Secondary | ICD-10-CM | POA: Diagnosis not present

## 2015-01-20 NOTE — Progress Notes (Signed)
  Oncology Nurse Navigator Documentation   Navigator Encounter Type: Treatment (01/20/15 0940) Patient Visit Type: Radonc (01/20/15 0940)     To provide support and encouragement, care continuity and to assess for needs, met with patient prior to RT. She reported tmts proceeding without difficulty, experiencing minimal SEs. She denied any needs or concerns; I encouraged her to contact me if that changes before I see her next, she verbalized understanding.  Gayleen Orem, RN, BSN, [place] at [place] [phone]                    Time Spent with Patient: 15 (01/20/15 0940)

## 2015-01-20 NOTE — Progress Notes (Signed)
[address]       [place] [address]             [phone]      Sergio B Huitron [place] Medical Record #[id number] Date of Birth: Oct 25, 1940  Referring: Leta Baptist, MD Primary Care: Deloria Lair, MD  Chief Complaint:   POST OP FOLLOW UP 10/01/2014 OPERATIVE REPORT PREOPERATIVE DIAGNOSIS: Large anterior mediastinal mass. POSTOPERATIVE DIAGNOSIS: Large anterior mediastinal mass. SURGICAL PROCEDURE: Partial sternotomy with excision of mediastinal mass and sternal plating. SURGEON: Lanelle Bal, MD. Path: Diagnosis 1. Mediastinum, mass resection, anterior - BENIGN THYROID TISSUE. - THERE IS NO EVIDENCE OF MALIGNANCY. 2. Mediastinum, mass resection, anterior - BENIGN THYROID TISSUE. - THERE IS NO EVIDENCE OF MALIGNANCY. 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 Patient has expected postoperative soreness over the upper sternum but this is improving.   She subsequently underwent resection of parotid tumor and is undergoing follow-up radiation with 15 treatments left.   Past Medical History  Diagnosis Date  . Paroxysmal atrial fibrillation (HCC)     normal echiocardiogram and stress nuclear; low TSH with normal T3 and T4   . HTN (hypertension)   . 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 Low TSH level 01/2010  . Neuromuscular disorder ([place])   . PONV (postoperative nausea and vomiting)      History  Smoking status  . Never Smoker   Smokeless tobacco  . Never Used    Comment: does not smoke    History  Alcohol Use  . 0.0 oz/week  . 0 Standard drinks or equivalent per week    Comment: 1 glass of wine at a time just socially     Allergies  Allergen Reactions  . Penicillins Other (See Comments)    Unknown     . Sulfonamide Derivatives Other (See Comments)    Leg Pain    Current Outpatient Prescriptions  Medication Sig Dispense Refill  . emollient (BIAFINE) cream Apply topically as needed. 454 g 0  . lisinopril-hydrochlorothiazide (PRINZIDE,ZESTORETIC) 10-12.5 MG per tablet Take 1 tablet by mouth daily. 90 tablet 1  . metoprolol tartrate (LOPRESSOR) 25 MG tablet Take 1 tablet (25 mg total) by mouth 2 (two) times daily. 180 tablet 1  . Multiple Vitamins-Minerals (HAIR/SKIN/NAILS PO) Take 1 tablet by mouth daily.    . polyethylene glycol-electrolytes (NULYTELY/GOLYTELY) 420 G solution Take 4,000 mLs by mouth once. (Patient not taking: Reported on 01/03/2015) 4000 mL 0  . potassium chloride (K-DUR) 10 MEQ tablet Take 10 mEq by mouth daily.    . ranitidine (ZANTAC) 150 MG tablet Take 150 mg by mouth at bedtime.    Marland Kitchen zinc gluconate 50 MG tablet Take 50 mg by mouth daily.       No current facility-administered medications for this visit.       Physical Exam: Ht 5\' 2"  (1.575 m)  Wt 160 lb (72.576 kg)  BMI 29.26 kg/m2  SpO2   General appearance: alert and cooperative Neurologic: intact Heart: regular rate and rhythm, S1, S2 normal, no murmur, click, rub or gallop Lungs: clear to auscultation bilaterally Abdomen: soft, non-tender; bowel sounds normal; no masses,  no organomegaly Extremities: extremities normal, atraumatic, no cyanosis or edema and Homans sign is  negative, no sign of DVT Wound: Partial sternotomy is stable and well-healed Some erythema in the zone of radiation in the right neck  Diagnostic Studies & Laboratory data:     Recent Radiology Findings:   Dg Chest 2 View  01/20/2015  CLINICAL DATA:  Substernal goiter, history of hypertension, atrial fibrillation. EXAM: CHEST  2 VIEW COMPARISON:  PA and lateral chest x-ray dated October 19, 2014. FINDINGS: The lungs are well-expanded. There is no focal infiltrate. There is no pleural effusion. The heart and pulmonary vascularity are  normal. The mediastinum is normal in width. There is no pleural effusion. The patient has undergone previous resection of substernal border with sternal fixation devices present. IMPRESSION: There is no active cardiopulmonary disease. Electronically Signed   By: David  Martinique M.D.   On: 01/20/2015 11:20      Recent Lab Findings: Lab Results  Component Value Date   WBC 13.4* 10/03/2014   HGB 13.0 11/16/2014   HCT 33.7* 10/03/2014   PLT 161 10/03/2014   GLUCOSE 113* 11/15/2014   CHOL  09/08/2007    142        ATP III CLASSIFICATION:  <200     mg/dL   Desirable  200-239  mg/dL   Borderline High  >=240    mg/dL   High   TRIG 215* 09/08/2007   HDL 37* 09/08/2007   LDLCALC  09/08/2007    62        Total Cholesterol/HDL:CHD Risk Coronary Heart Disease Risk Table                     Men   Women  1/2 Average Risk   3.4   3.3   ALT 44 10/03/2014   AST 27 10/03/2014   NA 137 11/15/2014   K 3.0* 11/15/2014   CL 102 11/15/2014   CREATININE 0.70 11/15/2014   BUN 14 11/15/2014   CO2 27 11/15/2014   TSH 0.345* 09/01/2010   INR 1.03 09/29/2014   HGBA1C  09/07/2007    5.9 (NOTE)   The ADA recommends the following therapeutic goals for glycemic   control related to Hgb A1C measurement:   Goal of Therapy:   < 7.0% Hgb A1C   Action Suggested:  > 8.0% Hgb A1C   Ref:  Diabetes Care, 22, Suppl. 1, 1999      Assessment / Plan:      Patient doing well following surgical resection of large anterior mediastinal mass was found to be benign thyroid tissue, her sternal incision and sternal plates are well-healed  I've not made the patient a return appointment but would be glad to see her at any time. She will need a follow-up CT scan for the 5 mm nodule in the left lung. Since she's closely followed by oncology I will let them dictate timing of repeat scans   Grace Isaac MD      [address] [place], [address] Office [phone]   [place] [phone]  01/20/2015  11:49 AM

## 2015-01-21 ENCOUNTER — Ambulatory Visit
Admission: RE | Admit: 2015-01-21 | Discharge: 2015-01-21 | Disposition: A | Discharge status: Home / Self Care | Payer: 59 | Source: Ambulatory Visit | Attending: Radiation Oncology | Admitting: Radiation Oncology

## 2015-01-21 DIAGNOSIS — Z510 Encounter for antineoplastic radiation therapy: Secondary | ICD-10-CM | POA: Diagnosis not present

## 2015-01-24 ENCOUNTER — Ambulatory Visit
Admission: RE | Admit: 2015-01-24 | Discharge: 2015-01-24 | Disposition: A | Discharge status: Home / Self Care | Payer: 59 | Source: Ambulatory Visit | Attending: Radiation Oncology | Admitting: Radiation Oncology

## 2015-01-24 ENCOUNTER — Encounter: Payer: Self-pay | Admitting: Radiation Oncology

## 2015-01-24 VITALS — BP 120/73 | HR 73 | Temp 97.6°F | Ht 62.0 in | Wt 160.0 lb

## 2015-01-24 DIAGNOSIS — Z510 Encounter for antineoplastic radiation therapy: Secondary | ICD-10-CM | POA: Diagnosis not present

## 2015-01-24 DIAGNOSIS — C07 Malignant neoplasm of parotid gland: Secondary | ICD-10-CM

## 2015-01-24 NOTE — Progress Notes (Signed)
   Weekly Management Note:  Outpatient    ICD-9-CM ICD-10-CM   1. 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  CBCT/MVCT images/Port film x-rays were reviewed.  The chart was checked.  Doing well, no new complaints, eating well. Taste is good.  Physical Findings:  height is 5\' 2"  (1.575 m) and weight is 160 lb (72.576 kg). Her temperature is 97.6 F (36.4 C). Her blood pressure is 120/73 and her pulse is 73.   Wt Readings from Last 3 Encounters:  01/24/15 160 lb (72.576 kg)  01/20/15 160 lb (72.576 kg)  01/17/15 160 lb 14.4 oz (72.984 kg)   Skin erythematous, intact in post surgical region of right face/neck.   Impression:  The patient is tolerating radiotherapy.  Plan:  Continue radiotherapy as planned. Continue current skin care +/- hydrocortisone 1% cream   ________________________________   Eppie Gibson, M.D.

## 2015-01-24 NOTE — Progress Notes (Signed)
[name] is here for her 18th fraction of radiatoin to her Right Parotid area. She denies pain, or any fatigue at this time. She reports she is still eating well, and even went to [employer] this morning. She has a small area of hyperpigmentation to her skin next to her ear lobe. The skin around that is pink, and she denies any tenderness at this area. Her ear is red on the outer edge, and she reports this area is slightly tender. She is using the biafine cream as directed twice a day.   BP 120/73 mmHg  Pulse 73  Temp(Src) 97.6 F (36.4 C)  Ht 5\' 2"  (1.575 m)  Wt 160 lb (72.576 kg)  BMI 29.26 kg/m2   Wt Readings from Last 3 Encounters:  01/24/15 160 lb (72.576 kg)  01/20/15 160 lb (72.576 kg)  01/17/15 160 lb 14.4 oz (72.984 kg)

## 2015-01-25 ENCOUNTER — Ambulatory Visit
Admission: RE | Admit: 2015-01-25 | Discharge: 2015-01-25 | Disposition: A | Discharge status: Home / Self Care | Payer: 59 | Source: Ambulatory Visit | Attending: Radiation Oncology | Admitting: Radiation Oncology

## 2015-01-25 DIAGNOSIS — C07 Malignant neoplasm of parotid gland: Secondary | ICD-10-CM | POA: Diagnosis not present

## 2015-01-25 DIAGNOSIS — Z510 Encounter for antineoplastic radiation therapy: Secondary | ICD-10-CM | POA: Diagnosis not present

## 2015-01-26 ENCOUNTER — Ambulatory Visit
Admission: RE | Admit: 2015-01-26 | Discharge: 2015-01-26 | Disposition: A | Discharge status: Home / Self Care | Payer: 59 | Source: Ambulatory Visit | Attending: Radiation Oncology | Admitting: Radiation Oncology

## 2015-01-26 DIAGNOSIS — Z510 Encounter for antineoplastic radiation therapy: Secondary | ICD-10-CM | POA: Diagnosis not present

## 2015-01-27 ENCOUNTER — Ambulatory Visit
Admission: RE | Admit: 2015-01-27 | Discharge: 2015-01-27 | Disposition: A | Discharge status: Home / Self Care | Payer: 59 | Source: Ambulatory Visit | Attending: Radiation Oncology | Admitting: Radiation Oncology

## 2015-01-27 DIAGNOSIS — C07 Malignant neoplasm of parotid gland: Secondary | ICD-10-CM | POA: Diagnosis not present

## 2015-01-27 DIAGNOSIS — Z510 Encounter for antineoplastic radiation therapy: Secondary | ICD-10-CM | POA: Diagnosis not present

## 2015-01-28 ENCOUNTER — Ambulatory Visit
Admission: RE | Admit: 2015-01-28 | Discharge: 2015-01-28 | Disposition: A | Discharge status: Home / Self Care | Payer: 59 | Source: Ambulatory Visit | Attending: Radiation Oncology | Admitting: Radiation Oncology

## 2015-01-28 DIAGNOSIS — Z510 Encounter for antineoplastic radiation therapy: Secondary | ICD-10-CM | POA: Diagnosis not present

## 2015-01-28 DIAGNOSIS — C07 Malignant neoplasm of parotid gland: Secondary | ICD-10-CM | POA: Diagnosis not present

## 2015-01-31 ENCOUNTER — Ambulatory Visit
Admission: RE | Admit: 2015-01-31 | Discharge: 2015-01-31 | Disposition: A | Discharge status: Home / Self Care | Payer: 59 | Source: Ambulatory Visit | Attending: Radiation Oncology | Admitting: Radiation Oncology

## 2015-01-31 ENCOUNTER — Encounter: Payer: Self-pay | Admitting: Radiation Oncology

## 2015-01-31 VITALS — BP 129/73 | HR 72 | Temp 97.7°F | Ht 62.0 in | Wt 160.5 lb

## 2015-01-31 DIAGNOSIS — Z510 Encounter for antineoplastic radiation therapy: Secondary | ICD-10-CM | POA: Diagnosis not present

## 2015-01-31 DIAGNOSIS — C07 Malignant neoplasm of parotid gland: Secondary | ICD-10-CM

## 2015-01-31 NOTE — Progress Notes (Signed)
[name] is here for her 23rd fraction of radiation to her Right Parotid. She complains of "soreness" to the cartilage on her Right ear. Her skin is red below her earlobe, with no open areas noted. She is using the biafine cream twice daily. She is eating well per her report and is not modifying her diet. She is trying to eat healthier food. She denies any fatigue at this time. She has no other complaints.   BP 129/73 mmHg  Pulse 72  Temp(Src) 97.7 F (36.5 C)  Ht 5\' 2"  (1.575 m)  Wt 160 lb 8 oz (72.802 kg)  BMI 29.35 kg/m2   Wt Readings from Last 3 Encounters:  01/31/15 160 lb 8 oz (72.802 kg)  01/24/15 160 lb (72.576 kg)  01/20/15 160 lb (72.576 kg)

## 2015-01-31 NOTE — Progress Notes (Signed)
   Weekly Management Note:  Outpatient    ICD-9-CM ICD-10-CM   1. 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  CBCT/MVCT images/Port film x-rays were reviewed.  The chart was checked.  Doing well, no new complaints other than mild soreness of the external ear; eating well.    Physical Findings:  height is 5\' 2"  (1.575 m) and weight is 160 lb 8 oz (72.802 kg). Her temperature is 97.7 F (36.5 C). Her blood pressure is 129/73 and her pulse is 72.   Wt Readings from Last 3 Encounters:  01/31/15 160 lb 8 oz (72.802 kg)  01/24/15 160 lb (72.576 kg)  01/20/15 160 lb (72.576 kg)   Skin erythematous, intact in post surgical region of right face/neck.  Oral cavity clear  Impression:  The patient is tolerating radiotherapy.  Plan:  Continue radiotherapy as planned. Continue current skin care +/- hydrocortisone 1% cream   ________________________________   Eppie Gibson, M.D.

## 2015-02-01 ENCOUNTER — Ambulatory Visit
Admission: RE | Admit: 2015-02-01 | Discharge: 2015-02-01 | Disposition: A | Discharge status: Home / Self Care | Payer: 59 | Source: Ambulatory Visit | Attending: Radiation Oncology | Admitting: Radiation Oncology

## 2015-02-01 DIAGNOSIS — C07 Malignant neoplasm of parotid gland: Secondary | ICD-10-CM | POA: Diagnosis not present

## 2015-02-01 DIAGNOSIS — Z510 Encounter for antineoplastic radiation therapy: Secondary | ICD-10-CM | POA: Diagnosis not present

## 2015-02-02 ENCOUNTER — Ambulatory Visit
Admission: RE | Admit: 2015-02-02 | Discharge: 2015-02-02 | Disposition: A | Discharge status: Home / Self Care | Payer: 59 | Source: Ambulatory Visit | Attending: Radiation Oncology | Admitting: Radiation Oncology

## 2015-02-02 DIAGNOSIS — Z510 Encounter for antineoplastic radiation therapy: Secondary | ICD-10-CM | POA: Diagnosis not present

## 2015-02-03 ENCOUNTER — Ambulatory Visit
Admission: RE | Admit: 2015-02-03 | Discharge: 2015-02-03 | Disposition: A | Discharge status: Home / Self Care | Payer: 59 | Source: Ambulatory Visit | Attending: Radiation Oncology | Admitting: Radiation Oncology

## 2015-02-03 DIAGNOSIS — C07 Malignant neoplasm of parotid gland: Secondary | ICD-10-CM | POA: Diagnosis not present

## 2015-02-03 DIAGNOSIS — Z510 Encounter for antineoplastic radiation therapy: Secondary | ICD-10-CM | POA: Diagnosis not present

## 2015-02-04 ENCOUNTER — Ambulatory Visit
Admission: RE | Admit: 2015-02-04 | Discharge: 2015-02-04 | Disposition: A | Discharge status: Home / Self Care | Payer: 59 | Source: Ambulatory Visit | Attending: Radiation Oncology | Admitting: Radiation Oncology

## 2015-02-06 DIAGNOSIS — Z510 Encounter for antineoplastic radiation therapy: Secondary | ICD-10-CM | POA: Diagnosis not present

## 2015-02-07 ENCOUNTER — Ambulatory Visit
Admission: RE | Admit: 2015-02-07 | Discharge: 2015-02-07 | Disposition: A | Discharge status: Home / Self Care | Payer: 59 | Source: Ambulatory Visit | Attending: Radiation Oncology | Admitting: Radiation Oncology

## 2015-02-07 ENCOUNTER — Encounter: Payer: Self-pay | Admitting: Radiation Oncology

## 2015-02-07 VITALS — BP 132/81 | HR 61 | Resp 16 | Wt 159.4 lb

## 2015-02-07 DIAGNOSIS — C07 Malignant neoplasm of parotid gland: Secondary | ICD-10-CM

## 2015-02-07 DIAGNOSIS — Z510 Encounter for antineoplastic radiation therapy: Secondary | ICD-10-CM | POA: Diagnosis not present

## 2015-02-07 NOTE — Progress Notes (Signed)
She complains of "soreness" to the cartilage on her Right ear. Her skin is red below her earlobe, with no open areas noted. She is using the biafine cream twice daily. She is eating well per her report and is not modifying her diet. She is trying to eat healthier food. She denies any fatigue at this time. She has no other complaints.  BP 132/81 mmHg  Pulse 61  Resp 16  Wt 159 lb 6.4 oz (72.303 kg)  SpO2 100% Wt Readings from Last 3 Encounters:  02/07/15 159 lb 6.4 oz (72.303 kg)  01/31/15 160 lb 8 oz (72.802 kg)  01/24/15 160 lb (72.576 kg)

## 2015-02-07 NOTE — Progress Notes (Signed)
   Weekly Management Note:  Outpatient    ICD-9-CM ICD-10-CM   1. 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  CBCT/MVCT images/Port film x-rays were reviewed.  The chart was checked.  Doing well, no new complaints   She is using the biafine cream twice daily.  Physical Findings:  weight is 159 lb 6.4 oz (72.303 kg). Her blood pressure is 132/81 and her pulse is 61. Her respiration is 16 and oxygen saturation is 100%.   Wt Readings from Last 3 Encounters:  02/07/15 159 lb 6.4 oz (72.303 kg)  01/31/15 160 lb 8 oz (72.802 kg)  01/24/15 160 lb (72.576 kg)   Skin more erythematous,a little dry in post surgical region of right face/neck.  Oral cavity clear  Impression:  The patient is tolerating radiotherapy.  Plan:  Continue radiotherapy as planned. Continue current skin care +/- hydrocortisone 1% cream F/u in 36mo - card given to schedule.  ________________________________   Eppie Gibson, M.D.

## 2015-02-08 ENCOUNTER — Ambulatory Visit
Admission: RE | Admit: 2015-02-08 | Discharge: 2015-02-08 | Disposition: A | Discharge status: Home / Self Care | Payer: 59 | Source: Ambulatory Visit | Attending: Radiation Oncology | Admitting: Radiation Oncology

## 2015-02-08 DIAGNOSIS — Z510 Encounter for antineoplastic radiation therapy: Secondary | ICD-10-CM | POA: Diagnosis not present

## 2015-02-08 DIAGNOSIS — C07 Malignant neoplasm of parotid gland: Secondary | ICD-10-CM | POA: Diagnosis not present

## 2015-02-09 ENCOUNTER — Encounter: Payer: Self-pay | Admitting: *Deleted

## 2015-02-09 ENCOUNTER — Encounter: Payer: Self-pay | Admitting: Radiation Oncology

## 2015-02-09 ENCOUNTER — Ambulatory Visit
Admission: RE | Admit: 2015-02-09 | Discharge: 2015-02-09 | Disposition: A | Discharge status: Home / Self Care | Payer: 59 | Source: Ambulatory Visit | Attending: Radiation Oncology | Admitting: Radiation Oncology

## 2015-02-09 DIAGNOSIS — Z510 Encounter for antineoplastic radiation therapy: Secondary | ICD-10-CM | POA: Diagnosis not present

## 2015-02-09 DIAGNOSIS — C07 Malignant neoplasm of parotid gland: Secondary | ICD-10-CM | POA: Diagnosis not present

## 2015-02-09 NOTE — Progress Notes (Signed)
  Oncology Nurse Navigator Documentation   Navigator Encounter Type: Treatment (02/09/15 1335)   Treatment Phase: Final Radiation Tx (02/09/15 1335)     Met with pt during final RT to offer support and to celebrate end of radiation treatment.  She was accompanied by her husband. I provided husband with a Certificate of Recognition for his supportive care. I provided post-RT guidance:  Importance of protecting treatment area from sun.  Continuation of Biafine application 2-3 times daily. I explained that my role as navigator will continue for several more months and that I will be calling and/or joining her during follow-up visits.  She confirmed understanding of f/u appt with Dr. Isidore Moos in January. I encouraged her to call me with needs/concerns.  Gayleen Orem, RN, BSN, [place] at [place] [phone]                Time Spent with Patient: 30 (02/09/15 1335)

## 2015-02-15 NOTE — Progress Notes (Signed)
  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      Plan: The patient has completed radiation treatment. The patient will return to radiation oncology clinic for routine followup in about one month. I advised them to call or return sooner if they have any questions or concerns related to their recovery or treatment.  -----------------------------------  Eppie Gibson, MD

## 2015-02-16 ENCOUNTER — Telehealth: Payer: Self-pay | Admitting: *Deleted

## 2015-02-16 ENCOUNTER — Telehealth: Payer: Self-pay | Admitting: Adult Health

## 2015-02-16 NOTE — Telephone Encounter (Signed)
  Oncology Nurse Navigator Documentation     Patient Visit Type: Follow-up (02/16/15 1504)     Patient called to report:  Recent onset of "yellowish puss" drainage from behind R ear, most notably evident Rx foeron pillowcase after night's sleep.    Area "crusts over", begins to bleed if she tries to clean, area is painful to the touch.    Has been applying Biafine without seeing improvement. She asked for advice.  I suggested she stop the Biafine application pending further direction from Dr. Isidore Moos.  I indicated I would call her back with Dr. Pearlie Oyster guidance.  Gayleen Orem, RN, BSN, [place] at [place] [phone]                     Time Spent with Patient: 15 (02/16/15 1504)

## 2015-02-16 NOTE — Telephone Encounter (Signed)
I was notified by Gayleen Orem, RN-Head & Neck Nurse Navigator that [name] is having continued drainage from behind her ear (in the radiation treatment area).  She completed treatment about 1 week ago and is concerned that it may be getting infected.  Per Dr. Pearlie Oyster request, I will see [name] to help evaluate and treat.    I spoke with [name] and she is scheduled to come see me in clinic tomorrow, 02/17/15 at 11am. I gave her instructions on where to check in for this visit.  I look forward to participating in her care and providing for her acute survivorship needs.   Mike Craze, NP [place] [phone]

## 2015-02-17 ENCOUNTER — Ambulatory Visit (HOSPITAL_BASED_OUTPATIENT_CLINIC_OR_DEPARTMENT_OTHER): Payer: 59 | Admitting: Adult Health

## 2015-02-17 ENCOUNTER — Encounter: Payer: Self-pay | Admitting: Adult Health

## 2015-02-17 VITALS — BP 126/80 | HR 68 | Temp 97.7°F | Resp 16 | Wt 161.6 lb

## 2015-02-17 DIAGNOSIS — C07 Malignant neoplasm of parotid gland: Secondary | ICD-10-CM | POA: Diagnosis not present

## 2015-02-17 DIAGNOSIS — L589 Radiodermatitis, unspecified: Secondary | ICD-10-CM

## 2015-02-17 NOTE — Progress Notes (Signed)
CLINIC:  Survivorship  REASON FOR VISIT:  Routine follow-up for head & neck cancer & to address post-treatment acute survivorship needs.   BRIEF ONCOLOGIC HISTORY:    Malignant tumor of parotid gland ([place])   08/24/2014 Imaging CT neck: 19 mm mass in superficial right parotid consistent with neoplasm.  Partially visualized anterior mediastinal mass, at least 7 cm; appearance suggests thyroid nodule, but has no thyroid continuity.    08/27/2014 Imaging CT chest: 9.6 x 3.8 x 6.1 cm lobulated, enhancing anterior mediastinal mass.    10/01/2014 Surgery Resection of mediastinal mass/sternotomy [name]): No malignancy; benign thyroid tissue.   11/16/2014 Initial Diagnosis Malignant tumor of parotid gland ([place])   11/16/2014 Surgery Right parotidectomy with facial nerve dissection & preservation ([place]): Adenocarcinoma, intermediate to focally high grade, nonspecific intercalated duct type. Tumor measures 1.8 cm. No perineural/angiolymphatic invasion. Negative margins.    11/16/2014 Pathologic Stage pT1, pNx   12/07/2014 Imaging PET scan: No evidence of metastatic disease or suspecious metabolic activity. Interval resection of right parotid tumor without suspicious residual activity. Interval resection of anterior mediastinal mass. Stable 57mm LUL lung nodule.    12/09/2014 Miscellaneous No recommendation for adjuvant chemotherapy ([place]).    12/30/2014 - 02/09/2015 Radiation Therapy Completed adjuvant IMRT [name]). Parotid bed, 60 Gy.  No nodal echelon treatment.      INTERVAL HISTORY:  [name] reached out to Gayleen Orem, RN-Head & Neck Nurse Navigator regarding post-radiation treatment concerns to her right posterior external ear.  She presents to the survivorship clinic today for further evaluation and management of these concerns.   Pain: Right posterior ear (only when touched)   Nutritional Status:  -Intake/Diet: Regular diet; tolerating po food and liquids -Using a feeding tube? No -Weight  change: Gain since 02/07/15  Swallowing:  No issues  Last Dentist visit:  Saw her primary care dentist on 02/15/15; no concerns per patient.  Summary of last Med Onc visit: 12/09/14 ([place]): No recommendation for chemotherapy.   Summary of last Rad Onc visit:  02/07/15 [name]): Pt tolerating radiotherapy; Will complete radiation therapy on 02/09/15  Last Imaging:  12/07/14-PET scan-No evidence of metastatic disease or suspecious metabolic activity. Interval resection of right parotid tumor without suspicious residual activity. Interval resection of anterior mediastinal mass. Stable 67mm LUL lung nodule.     ADDITIONAL REVIEW OF SYSTEMS:  Review of Systems  Constitutional: Negative for fever, chills, weight loss and malaise/fatigue.  HENT: Positive for ear pain. Negative for sore throat.        -External ear pain at pinna and posterior ear -Denies dysphagia -Denies trismus; is performing jaw exercises daily.   Skin: Positive for rash.       -Posterior right ear has been "oozing" yellow, clear, and sometimes blood-tinged fluid.  Fluid is not thick, purulent, or with odor.  Area has been crusting and pt reports cleaning it with soap and water "to get all of the crust off of the wound." Reports occasional bleeding after cleaning wound.  -Has been applying Biafine cream about 3x/day with little improvement, although she states, "It looks better today than it has before, but I still thought you needed to take a look at the wound today just to be safe."      CURRENT MEDICATIONS:  Current Outpatient Prescriptions on File Prior to Visit  Medication Sig Dispense Refill  . emollient (BIAFINE) cream Apply topically as needed. 454 g 0  . lisinopril-hydrochlorothiazide (PRINZIDE,ZESTORETIC) 10-12.5 MG per tablet Take 1 tablet by mouth daily. 90 tablet  1  . metoprolol tartrate (LOPRESSOR) 25 MG tablet Take 1 tablet (25 mg total) by mouth 2 (two) times daily. 180 tablet 1  . Multiple Vitamins-Minerals  (HAIR/SKIN/NAILS PO) Take 1 tablet by mouth daily.    . polyethylene glycol-electrolytes (NULYTELY/GOLYTELY) 420 G solution Take 4,000 mLs by mouth once. (Patient not taking: Reported on 01/03/2015) 4000 mL 0  . potassium chloride (K-DUR) 10 MEQ tablet Take 10 mEq by mouth daily.    . ranitidine (ZANTAC) 150 MG tablet Take 150 mg by mouth at bedtime.    Marland Kitchen zinc gluconate 50 MG tablet Take 50 mg by mouth daily.       No current facility-administered medications on file prior to visit.    ALLERGIES:  Allergies  Allergen Reactions  . Penicillins Other (See Comments)    Unknown   . Sulfonamide Derivatives Other (See Comments)    Leg Pain     PHYSICAL EXAM:  Filed Vitals:   02/17/15 1050  BP: 126/80  Pulse: 68  Temp: 97.7 F (36.5 C)  Resp: 16    Weight Date  161 lb 9.6 oz (73.301 kg) 02/17/15  159 lb 6.4 oz (72.303 kg) 02/07/15  159 lb (72.122 kg) Pre-treatment: 10/19/14    General: Well-nourished, well-appearing female in no acute distress.  She is unaccompanied today.  HEENT: Head is atraumatic and normocephalic.  Pupils equal and reactive to light. Conjunctivae clear without exudate.  Sclerae anicteric.  Right ear: Lobule is erythematous with mild dry desquamation and mild edema. Posterior ear (behind lobule beneath conchal bowl) with approx. 1 cm x 0.5 cm area of moist desquamation with crusting.  No frank bleeding. No purulent drainage or induration noted.  No evidence of infection.   Right face: Mandibular area with expected grade 1 radiation dermatitis (erythema and dry desquamation).   Neck: No palpable masses. Skin on upper neck/mandible has mild redness, but is progressing towards healing. Neuro: No focal deficits. Steady gait.   Psych: Normal mood and affect for situation. Extremities: No edema, cyanosis, or clubbing.   Skin: Warm and dry.    LABORATORY DATA:  None at this visit.  DIAGNOSTIC IMAGING:  None at this visit.    ASSESSMENT & PLAN:  [name] is a  pleasant 74 y.o. female with history of cancer of the right parotid gland, treated with parotidectomy and adjuvant radiation therapy; completed treatment on 02/09/15.  Patient presents to survivorship clinic today for routine follow-up after finishing treatment.   1. Cancer of the right parotid gland:  Ms. Kokes is continuing to recover from definitive surgery and radiation therapy to her head & neck. She will see Dr. Whitney Muse on 03/15/15 and will then see Dr. Isidore Cross on 03/30/15.    2. Right posterior ear, grade 2 radiation dermatitis: The findings on physical exam of Ms. Burrowes's area of concern are most consistent with grade 2 radiation dermatitis.  This is an expected finding since she is about 1 week out from completing radiation therapy.  I encouraged her to keep the area clean, but she should not scrub/scratch off the crusting.  I explained that this was her body's immune system working to generate new, healthy skin cells to the damaged area.  I did give her a large Mepilex dressing and gave her directions on how she could cut this bandage to apply at night to help with the drainage. I educated her that this bandage was safe to be used on friable skin after radiation therapy and should not cause  further trauma to the area, but rather help contain the drainage, particularly at night when sleeping.  There is no evidence of local or systemic infection today.  I did let her know that if the drainage becomes thicker, more purulent instead of clear, has a foul odor, or she develops fever/chills, then she should call me.  I would then e-prescribe systemic antibiotics for presumed secondary staph aureus skin infection.  This seems unlikely, as she already has some evidence of some early tissue granulation. I encouraged her to continue to use the Biafine cream as long as the wound is open.  When the wound closes over the next 1-2 weeks, then I let her know that it would safe for her to use a non-perfumed lotion with  vitamin E.  I also encouraged her to keep treatment area covered during potential sun exposure.  She expressed verbal understanding of this plan of care and was able to reverse demonstrate her wound care.   She asked my advice on undergoing a perm treatment for her hair.  I advised against her getting any type of chemical treatment to her hair at this time, due to the risk of chemical exposure to her healing skin/open wound.  I encouraged her to wait at least 2 weeks and/or until the wound is completely closed and her skin is better healed before having any chemical hair treatments. Even then, extreme caution should be used when exposing the radiation treatment area to any chemicals that may cause a burn.  She verbalized understanding.    I will be happy to see this lovely patient again at any time in the future for any survivorship concerns.  Thank you for the opportunity to participate in her care.    Mike Craze, NP [place] [phone]

## 2015-02-23 ENCOUNTER — Other Ambulatory Visit (HOSPITAL_COMMUNITY): Payer: Self-pay | Admitting: Family Medicine

## 2015-02-23 ENCOUNTER — Ambulatory Visit (HOSPITAL_COMMUNITY)
Admission: RE | Admit: 2015-02-23 | Discharge: 2015-02-23 | Disposition: A | Discharge status: Home / Self Care | Payer: 59 | Source: Ambulatory Visit | Attending: Family Medicine | Admitting: Family Medicine

## 2015-02-23 ENCOUNTER — Telehealth: Payer: Self-pay | Admitting: Adult Health

## 2015-02-23 DIAGNOSIS — Z1231 Encounter for screening mammogram for malignant neoplasm of breast: Secondary | ICD-10-CM | POA: Diagnosis not present

## 2015-02-23 NOTE — Telephone Encounter (Signed)
I spoke with [name] to follow-up regarding her recent visit for radiation dermatitis.  She states that "it is so much better and the pain is getting so much better too."  After speaking with Dr. Isidore Moos, I let Ms. Burket know that she should put Neosporin on any areas that are still open and should put the Biafine on any surrounding areas that are closed.  I apologized for any confusion on her skin care recommendations and she voiced understanding and appreciation for my call.  She also told me that she has decided to forgo getting a perm hair treatment until after the holidays and her skin has a longer to heal and I support that decision.  I encouraged her to call me with any additional questions or concerns.    Mike Craze, NP [place] [phone]

## 2015-03-01 NOTE — Progress Notes (Deleted)
12-30-14 Custom Bolus was designed and placed on patient for first treatment, constituting an intermediate treatment device.  -----------------------------------  Eppie Gibson, MD

## 2015-03-03 ENCOUNTER — Ambulatory Visit (INDEPENDENT_AMBULATORY_CARE_PROVIDER_SITE_OTHER): Payer: 59 | Admitting: Otolaryngology

## 2015-03-03 DIAGNOSIS — C07 Malignant neoplasm of parotid gland: Secondary | ICD-10-CM | POA: Diagnosis not present

## 2015-03-03 DIAGNOSIS — H6121 Impacted cerumen, right ear: Secondary | ICD-10-CM

## 2015-03-09 ENCOUNTER — Ambulatory Visit (HOSPITAL_COMMUNITY): Payer: 59 | Admitting: Hematology & Oncology

## 2015-03-09 ENCOUNTER — Ambulatory Visit (HOSPITAL_COMMUNITY)
Admission: RE | Admit: 2015-03-09 | Discharge: 2015-03-09 | Disposition: A | Discharge status: Home / Self Care | Payer: 59 | Source: Ambulatory Visit | Attending: Internal Medicine | Admitting: Internal Medicine

## 2015-03-09 ENCOUNTER — Encounter (HOSPITAL_COMMUNITY): Payer: Self-pay | Admitting: *Deleted

## 2015-03-09 ENCOUNTER — Encounter (HOSPITAL_COMMUNITY)
Admission: RE | Disposition: A | Discharge status: Home / Self Care | Payer: Self-pay | Source: Ambulatory Visit | Attending: Internal Medicine

## 2015-03-09 DIAGNOSIS — Z8249 Family history of ischemic heart disease and other diseases of the circulatory system: Secondary | ICD-10-CM | POA: Diagnosis not present

## 2015-03-09 DIAGNOSIS — Z79899 Other long term (current) drug therapy: Secondary | ICD-10-CM | POA: Diagnosis not present

## 2015-03-09 DIAGNOSIS — I480 Paroxysmal atrial fibrillation: Secondary | ICD-10-CM | POA: Diagnosis not present

## 2015-03-09 DIAGNOSIS — Z7982 Long term (current) use of aspirin: Secondary | ICD-10-CM | POA: Diagnosis not present

## 2015-03-09 DIAGNOSIS — K644 Residual hemorrhoidal skin tags: Secondary | ICD-10-CM | POA: Diagnosis not present

## 2015-03-09 DIAGNOSIS — Z880 Allergy status to penicillin: Secondary | ICD-10-CM | POA: Insufficient documentation

## 2015-03-09 DIAGNOSIS — Z882 Allergy status to sulfonamides status: Secondary | ICD-10-CM | POA: Diagnosis not present

## 2015-03-09 DIAGNOSIS — Z90710 Acquired absence of both cervix and uterus: Secondary | ICD-10-CM | POA: Insufficient documentation

## 2015-03-09 DIAGNOSIS — Z1211 Encounter for screening for malignant neoplasm of colon: Secondary | ICD-10-CM | POA: Insufficient documentation

## 2015-03-09 DIAGNOSIS — I10 Essential (primary) hypertension: Secondary | ICD-10-CM | POA: Insufficient documentation

## 2015-03-09 DIAGNOSIS — K6389 Other specified diseases of intestine: Secondary | ICD-10-CM | POA: Diagnosis not present

## 2015-03-09 DIAGNOSIS — Z86010 Personal history of colonic polyps: Secondary | ICD-10-CM | POA: Diagnosis present

## 2015-03-09 HISTORY — PX: COLONOSCOPY: SHX5424

## 2015-03-09 HISTORY — PX: OTHER SURGICAL HISTORY: SHX169

## 2015-03-09 SURGERY — COLONOSCOPY
Anesthesia: Moderate Sedation

## 2015-03-09 MED ORDER — MEPERIDINE HCL 50 MG/ML IJ SOLN
INTRAMUSCULAR | Status: DC | PRN
  Administered 2015-03-09 (×2): 25 mg

## 2015-03-09 MED ORDER — SODIUM CHLORIDE 0.9 % IV SOLN
INTRAVENOUS | Status: DC
  Administered 2015-03-09: 1000 mL via INTRAVENOUS

## 2015-03-09 MED ORDER — MIDAZOLAM HCL 5 MG/5ML IJ SOLN
INTRAMUSCULAR | Status: DC | PRN
  Administered 2015-03-09: 2 mg via INTRAVENOUS
  Administered 2015-03-09 (×2): 1 mg via INTRAVENOUS

## 2015-03-09 MED ORDER — STERILE WATER FOR IRRIGATION IR SOLN
Status: DC | PRN
  Administered 2015-03-09: 09:00:00

## 2015-03-09 MED ORDER — MEPERIDINE HCL 50 MG/ML IJ SOLN
INTRAMUSCULAR | Status: AC
  Filled 2015-03-09: qty 1

## 2015-03-09 MED ORDER — MIDAZOLAM HCL 5 MG/5ML IJ SOLN
INTRAMUSCULAR | Status: AC
  Filled 2015-03-09: qty 10

## 2015-03-09 NOTE — Discharge Instructions (Signed)
Resume usual medications and diet. °No driving for 24 hours. ° ° ° °Colonoscopy, Care After °These instructions give you information on caring for yourself after your procedure. Your doctor may also give you more specific instructions. Call your doctor if you have any problems or questions after your procedure. °HOME CARE °· Do not drive for 24 hours. °· Do not sign important papers or use machinery for 24 hours. °· You may shower. °· You may go back to your usual activities, but go slower for the first 24 hours. °· Take rest breaks often during the first 24 hours. °· Walk around or use warm packs on your belly (abdomen) if you have belly cramping or gas. °· Drink enough fluids to keep your pee (urine) clear or pale yellow. °· Resume your normal diet. Avoid heavy or fried foods. °· Avoid drinking alcohol for 24 hours or as told by your doctor. °· Only take medicines as told by your doctor. °If a tissue sample (biopsy) was taken during the procedure:  °· Do not take aspirin or blood thinners for 7 days, or as told by your doctor. °· Do not drink alcohol for 7 days, or as told by your doctor. °· Eat soft foods for the first 24 hours. °GET HELP IF: °You still have a small amount of blood in your poop (stool) 2-3 days after the procedure. °GET HELP RIGHT AWAY IF: °· You have more than a small amount of blood in your poop. °· You see clumps of tissue (blood clots) in your poop. °· Your belly is puffy (swollen). °· You feel sick to your stomach (nauseous) or throw up (vomit). °· You have a fever. °· You have belly pain that gets worse and medicine does not help. °MAKE SURE YOU: °· Understand these instructions. °· Will watch your condition. °· Will get help right away if you are not doing well or get worse. °  °This information is not intended to replace advice given to you by your health care provider. Make sure you discuss any questions you have with your health care provider. °  °Document Released: 04/07/2010 Document  Revised: 03/10/2013 Document Reviewed: 11/10/2012 °Elsevier Interactive Patient Education ©2016 [employer] ° °Hemorrhoids °Hemorrhoids are swollen veins around the rectum or anus. There are two types of hemorrhoids:  °· Internal hemorrhoids. These occur in the veins just inside the rectum. They may poke through to the outside and become irritated and painful. °· External hemorrhoids. These occur in the veins outside the anus and can be felt as a painful swelling or hard lump near the anus. °CAUSES °· Pregnancy.   °· Obesity.   °· Constipation or diarrhea.   °· Straining to have a bowel movement.   °· Sitting for long periods on the toilet. °· Heavy lifting or other activity that caused you to strain. °· Anal intercourse. °SYMPTOMS  °· Pain.   °· Anal itching or irritation.   °· Rectal bleeding.   °· Fecal leakage.   °· Anal swelling.   °· One or more lumps around the anus.   °DIAGNOSIS  °Your caregiver may be able to diagnose hemorrhoids by visual examination. Other examinations or tests that may be performed include:  °· Examination of the rectal area with a gloved hand (digital rectal exam).   °· Examination of anal canal using a small tube (scope).   °· A blood test if you have lost a significant amount of blood. °· A test to look inside the colon (sigmoidoscopy or colonoscopy). °TREATMENT °Most hemorrhoids can be treated at home. However,   if symptoms do not seem to be getting better or if you have a lot of rectal bleeding, your caregiver may perform a procedure to help make the hemorrhoids get smaller or remove them completely. Possible treatments include:  °· Placing a rubber band at the base of the hemorrhoid to cut off the circulation (rubber band ligation).   °· Injecting a chemical to shrink the hemorrhoid (sclerotherapy).   °· Using a tool to burn the hemorrhoid (infrared light therapy).   °· Surgically removing the hemorrhoid (hemorrhoidectomy).   °· Stapling the hemorrhoid to block blood flow to the  tissue (hemorrhoid stapling).   °HOME CARE INSTRUCTIONS  °· Eat foods with fiber, such as whole grains, beans, nuts, fruits, and vegetables. Ask your doctor about taking products with added fiber in them (fiber supplements). °· Increase fluid intake. Drink enough water and fluids to keep your urine clear or pale yellow.   °· Exercise regularly.   °· Go to the bathroom when you have the urge to have a bowel movement. Do not wait.   °· Avoid straining to have bowel movements.   °· Keep the anal area dry and clean. Use wet toilet paper or moist towelettes after a bowel movement.   °· Medicated creams and suppositories may be used or applied as directed.   °· Only take over-the-counter or prescription medicines as directed by your caregiver.   °· Take warm sitz baths for 15-20 minutes, 3-4 times a day to ease pain and discomfort.   °· Place ice packs on the hemorrhoids if they are tender and swollen. Using ice packs between sitz baths may be helpful.   °¨ Put ice in a plastic bag.   °¨ Place a towel between your skin and the bag.   °¨ Leave the ice on for 15-20 minutes, 3-4 times a day.   °· Do not use a donut-shaped pillow or sit on the toilet for long periods. This increases blood pooling and pain.   °SEEK MEDICAL CARE IF: °· You have increasing pain and swelling that is not controlled by treatment or medicine. °· You have uncontrolled bleeding. °· You have difficulty or you are unable to have a bowel movement. °· You have pain or inflammation outside the area of the hemorrhoids. °MAKE SURE YOU: °· Understand these instructions. °· Will watch your condition. °· Will get help right away if you are not doing well or get worse. °  °This information is not intended to replace advice given to you by your health care provider. Make sure you discuss any questions you have with your health care provider. °  °Document Released: 03/02/2000 Document Revised: 02/20/2012 Document Reviewed: 01/08/2012 °Elsevier Interactive Patient  Education ©2016 [employer]. ° °

## 2015-03-09 NOTE — H&P (Signed)
[name] is an 74 y.o. female.   Chief Complaint: Patient is here for colonoscopy. HPI: Patient is 74 year old Caucasian female who is here for screening colonoscopy. She denies abdominal pain change in bowel habits or rectal bleeding. She has history of hemorrhoids and/or tags. She says getting larger. Last colonoscopy was in December 2006 with removal of single polyp and was inflammatory. Family History is negative for CRC.  Past Medical History  Diagnosis Date  . Paroxysmal atrial fibrillation (HCC)     normal echiocardiogram and stress nuclear; low TSH with normal T3 and T4   . HTN (hypertension)   . 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 Low TSH level 01/2010  . Neuromuscular disorder ([place])   . PONV (postoperative nausea and vomiting)     Past Surgical History  Procedure Laterality Date  . Vein ligation and stripping    . Appendectomy    . Cholecystectomy    . Abdominal hysterectomy      w/o oophorectomy  . Bunionectomy      bilateral  . Tonsillectomy    . Tonsillectomy    . Esophagogastroduodenoscopy  12/27/2011    Procedure: ESOPHAGOGASTRODUODENOSCOPY (EGD);  Surgeon: Rogene Houston, MD;  [facility];  Service: Endoscopy;  Laterality: N/A;  250  . Resection of mediastinal mass N/A 10/01/2014    Procedure: RESECTION OF MEDIASTINAL MASS;  Surgeon: Grace Isaac, MD;  Location: [place];  Service: Thoracic;  Laterality: N/A;  . Sternotomy N/A 10/01/2014    Procedure: STERNOTOMY;  Surgeon: Grace Isaac, MD;  Location: [place];  Service: Thoracic;  Laterality: N/A;  . Parotidectomy Right 11/16/2014    Procedure: RIGHT PAROTIDECTOMY;  Surgeon: Leta Baptist, MD;  Location: [place];  Service: ENT;  Laterality: Right;    Family History  Problem Relation Age of Onset  . Heart disease Father   . Hypertension Father   . Diabetes Mother   . Hypertension Mother    Social History:  reports that she  has never smoked. She has never used smokeless tobacco. She reports that she drinks alcohol. She reports that she does not use illicit drugs.  Allergies:  Allergies  Allergen Reactions  . Penicillins Other (See Comments)    Unknown   . Sulfonamide Derivatives Other (See Comments)    Leg Pain    Medications Prior to Admission  Medication Sig Dispense Refill  . aspirin EC 81 MG tablet Take 81 mg by mouth daily.    Marland Kitchen emollient (BIAFINE) cream Apply topically as needed. (Patient taking differently: Apply 1 application topically 3 (three) times daily. ) 454 g 0  . lisinopril-hydrochlorothiazide (PRINZIDE,ZESTORETIC) 10-12.5 MG per tablet Take 1 tablet by mouth daily. 90 tablet 1  . metoprolol tartrate (LOPRESSOR) 25 MG tablet Take 1 tablet (25 mg total) by mouth 2 (two) times daily. 180 tablet 1  . Multiple Vitamins-Minerals (HAIR/SKIN/NAILS PO) Take 1 tablet by mouth daily.    . Omega-3 Fatty Acids (OMEGA-3 2100 PO) Take 1 capsule by mouth at bedtime.    . polyethylene glycol-electrolytes (NULYTELY/GOLYTELY) 420 G solution Take 4,000 mLs by mouth once. 4000 mL 0  . potassium chloride (K-DUR) 10 MEQ tablet Take 10 mEq by mouth daily.    . ranitidine (ZANTAC) 150 MG tablet Take 150 mg by mouth at bedtime.    Marland Kitchen zinc gluconate 50 MG tablet Take 50 mg by mouth daily.  No results found for this or any previous visit (from the past 48 hour(s)). No results found.  ROS  Blood pressure 128/82, pulse 90, temperature 97.4 F (36.3 C), temperature source Oral, resp. rate 18, height 5\' 2"  (1.575 m), weight 157 lb (71.215 kg), SpO2 99 %. Physical Exam  Constitutional: She appears well-developed and well-nourished.  HENT:  Mouth/Throat: Oropharynx is clear and moist.  Eyes: Conjunctivae are normal. No scleral icterus.  Neck: No thyromegaly present.  Cardiovascular: Normal rate, regular rhythm and normal heart sounds.   No murmur heard. Respiratory:  Midsternal scar  GI: Soft. She exhibits  no distension and no mass. There is no tenderness.  Musculoskeletal: She exhibits no edema.  Lymphadenopathy:    She has no cervical adenopathy.  Neurological: She is alert.  Skin: Skin is warm and dry.     Assessment/Plan Average risk screening colonoscopy.  REHMAN,NAJEEB U 03/09/2015, 8:27 AM

## 2015-03-09 NOTE — Op Note (Signed)
COLONOSCOPY PROCEDURE REPORT  PATIENT:  [name]  MR#:  [id number] Birthdate:  28-Feb-1941, 74 y.o., female Endoscopist:  Dr. Rogene Houston, MD Referred By:  Dr. Darrol Poke D Procedure Date: 03/09/2015  Procedure:   Colonoscopy  Indications: Patient is 74 year old Caucasian female was undergoing average risk screening colonoscopy. Last exam was in December 2006.  Informed Consent:  The procedure and risks were reviewed with the patient and informed consent was obtained.  Medications:  Demerol 50 mg IV Versed 4 mg IV  Description of procedure:  After a digital rectal exam was performed, that colonoscope was advanced from the anus through the rectum and colon to the area of the cecum, ileocecal valve and appendiceal orifice. The cecum was deeply intubated. These structures were well-seen and photographed for the record. From the level of the cecum and ileocecal valve, the scope was slowly and cautiously withdrawn. The mucosal surfaces were carefully surveyed utilizing scope tip to flexion to facilitate fold flattening as needed. The scope was pulled down into the rectum where a thorough exam including retroflexion was performed.  Findings:   Prep satisfactory. Home in mucosa of cecum, ascending colon, hepatic flexure, transverse colon, splenic flexure, descending and sigmoid colon. Normal rectal mucosa. Single anal papillae and small hemorrhoids below the dentate line. Singles soft anal skin tag   Therapeutic/Diagnostic Maneuvers Performed:   None  Complications:  None  EBL: None  Cecal Withdrawal Time:  8 minutes  Impression:  Examination performed to cecum. Normal colonoscopy except single anal papilla and small external hemorrhoids.  Recommendations:  Standard instructions given.   REHMAN,NAJEEB U  03/09/2015 9:01 AM  CC: Dr. Deloria Lair, MD & Dr. Rayne Du ref. provider found

## 2015-03-15 ENCOUNTER — Encounter (HOSPITAL_COMMUNITY): Payer: 59 | Attending: Hematology & Oncology | Admitting: Hematology & Oncology

## 2015-03-15 ENCOUNTER — Encounter (HOSPITAL_COMMUNITY): Payer: Self-pay | Admitting: Hematology & Oncology

## 2015-03-15 VITALS — BP 139/74 | HR 66 | Temp 97.5°F | Resp 16 | Wt 157.2 lb

## 2015-03-15 DIAGNOSIS — C07 Malignant neoplasm of parotid gland: Secondary | ICD-10-CM | POA: Diagnosis not present

## 2015-03-15 DIAGNOSIS — R911 Solitary pulmonary nodule: Secondary | ICD-10-CM | POA: Insufficient documentation

## 2015-03-15 NOTE — Patient Instructions (Addendum)
[place] at [facility] Discharge Instructions  RECOMMENDATIONS MADE BY THE CONSULTANT AND ANY TEST RESULTS WILL BE SENT TO YOUR REFERRING PHYSICIAN.   Exam completed by Dr Whitney Muse today. Scans in March. Return to see the doctor after the scans in March. If your have any new lumps or bumps please call us.  Please call the clinic if you have any questions or concerns.    Thank you for choosing [place] at [facility] to provide your oncology and hematology care.  To afford each patient quality time with our provider, please arrive at least 15 minutes before your scheduled appointment time.    You need to re-schedule your appointment should you arrive 10 or more minutes late.  We strive to give you quality time with our providers, and arriving late affects you and other patients whose appointments are after yours.  Also, if you no show three or more times for appointments you may be dismissed from the clinic at the providers discretion.     Again, thank you for choosing [facility].  Our hope is that these requests will decrease the amount of time that you wait before being seen by our physicians.       _____________________________________________________________  Should you have questions after your visit to [facility], please contact our office at [phone] between the hours of 8:30 a.m. and 4:30 p.m.  Voicemails left after 4:30 p.m. will not be returned until the following business day.  For prescription refill requests, have your pharmacy contact our office.

## 2015-03-15 NOTE — Progress Notes (Signed)
[facility] Note  Patient Care Team: Zella Richer. Scotty Court, MD as PCP - General (Unknown Physician Specialty) Rogene Houston, MD (Gastroenterology) Leta Baptist, MD as Consulting Physician (Otolaryngology)  CHIEF COMPLAINTS/PURPOSE OF CONSULTATION:  Adenocarcinoma of the Right Parotid Gland Final pathology of the parotid gland showing an adenocarcinoma, intermediate to focally high grade, nonspecific intercalated duct type, 1.8 cm in greatest dimension, no perineural nor angiolymphatic invasion margins are negative (second opinion at [facility]) pT1,pNX Right lateral parotidectomy with facial nerve dissection and preservation on 11/16/2014 with Dr. Benjamine Mola Partial sternotomy with excision of mediastinal mass and sternal plating by Dr. Servando Snare on 10/01/2014 Final pathology of mediastinal mass benign thyroid tissue with no evidence of malignancy  HISTORY OF PRESENTING ILLNESS:  [name] 74 y.o. female is here for follow-up of adenocarcinoma of the parotid.  Ms. Cross is here alone and feeling well. She states that her chest is doing well with some stinging pain. She is still able to lift and move as she needs to. Denies trouble breathing or sleeping. She continues to feel some numbness and "twinges" in her face. Denies dry mouth or neuropathy in her hands or feet. She reports that she did well with radiation treatment. Denies any new lumps or bumps. She is eating well.  Her PCP is Dr. Scotty Court and she'll be seeing him this afternoon. She is seeing him today to recheck and discuss her potassium levels. She saw survivorship at [facility] on 02/17/15. She received a colonoscopy last Wednesday, 03/09/15. She has been discharged by Dr. Servando Snare.  MEDICAL HISTORY:  Past Medical History  Diagnosis Date  . Paroxysmal atrial fibrillation (HCC)     normal echiocardiogram and stress nuclear; low TSH with normal T3 and T4   . HTN (hypertension)   . 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 Low TSH level 01/2010  . Neuromuscular disorder ([place])   . PONV (postoperative nausea and vomiting)     SURGICAL HISTORY: Past Surgical History  Procedure Laterality Date  . Vein ligation and stripping    . Appendectomy    . Cholecystectomy    . Abdominal hysterectomy      w/o oophorectomy  . Bunionectomy      bilateral  . Tonsillectomy    . Tonsillectomy    . Esophagogastroduodenoscopy  12/27/2011    Procedure: ESOPHAGOGASTRODUODENOSCOPY (EGD);  Surgeon: Rogene Houston, MD;  [facility];  Service: Endoscopy;  Laterality: N/A;  250  . Resection of mediastinal mass N/A 10/01/2014    Procedure: RESECTION OF MEDIASTINAL MASS;  Surgeon: Grace Isaac, MD;  Location: [place];  Service: Thoracic;  Laterality: N/A;  . Sternotomy N/A 10/01/2014    Procedure: STERNOTOMY;  Surgeon: Grace Isaac, MD;  Location: [place];  Service: Thoracic;  Laterality: N/A;  . Parotidectomy Right 11/16/2014    Procedure: RIGHT PAROTIDECTOMY;  Surgeon: Leta Baptist, MD;  Location: [facility];  Service: ENT;  Laterality: Right;  . Colonoscopy  03/09/15    SOCIAL HISTORY: Social History   Social History  . Marital Status: Married    Spouse Name: N/A  . Number of Children: N/A  . Years of Education: N/A   Occupational History  . Not on file.   Social History Main Topics  . Smoking status: Never Smoker   . Smokeless tobacco: Never Used     Comment: does not  smoke  . Alcohol Use: 0.0 oz/week    0 Standard drinks or equivalent per week     Comment: 1 glass of wine at a time just socially  . Drug Use: No  . Sexual Activity: Not on file   Other Topics Concern  . Not on file   Social History Narrative   Married, employment - Network engineer. No caffeine. 29 yr education   Married for 57 years. 1 child. No grandchildren.  She plays bridge, the organ at church and enjoys traveling. She works at [employer] She is a non-smoker.  Her husband used to smoke but quit in 1989. No ETOH, rare wine  FAMILY HISTORY: Family History  Problem Relation Age of Onset  . Heart disease Father   . Hypertension Father   . Diabetes Mother   . Hypertension Mother    indicated that her mother is deceased. She reported the following about her father: conduction system disease requiring pacemaker implantation. She reported the following about her sister: Hx of cardiac disease and pulmonary embolism .   Mother died at 66 from dementia (Alzheimer's ?) Father died at 4 from complications of appendicitis 1 sister who is healthy, she is obese and has some "heart issues."  ALLERGIES:  is allergic to penicillins and sulfonamide derivatives.  MEDICATIONS:  Current Outpatient Prescriptions  Medication Sig Dispense Refill  . aspirin EC 81 MG tablet Take 81 mg by mouth daily.    Marland Kitchen lisinopril-hydrochlorothiazide (PRINZIDE,ZESTORETIC) 10-12.5 MG per tablet Take 1 tablet by mouth daily. 90 tablet 1  . metoprolol tartrate (LOPRESSOR) 25 MG tablet Take 1 tablet (25 mg total) by mouth 2 (two) times daily. 180 tablet 1  . Multiple Vitamins-Minerals (HAIR/SKIN/NAILS PO) Take 1 tablet by mouth daily.    . Omega-3 Fatty Acids (OMEGA-3 2100 PO) Take 1 capsule by mouth at bedtime.    . potassium chloride (K-DUR) 10 MEQ tablet Take 10 mEq by mouth daily.    . ranitidine (ZANTAC) 150 MG tablet Take 150 mg by mouth at bedtime.    Marland Kitchen zinc gluconate 50 MG tablet Take 50 mg by mouth daily.       No current facility-administered medications for this visit.    Review of Systems  Constitutional: Negative for fever, chills, weight loss and malaise/fatigue.  HENT: Negative for congestion, hearing loss, nosebleeds, sore throat and tinnitus.   Eyes: Negative for blurred vision, double vision, pain and discharge.  Respiratory: Negative for cough, hemoptysis, sputum production, shortness of breath and wheezing.   Cardiovascular: Positive for chest pain. Negative  for palpitations, claudication, leg swelling and PND.      Some stinging chest pain. Gastrointestinal: Negative for heartburn, nausea, vomiting, abdominal pain, diarrhea, constipation, blood in stool and melena.  Genitourinary: Negative for dysuria, urgency, frequency and hematuria.  Musculoskeletal: Negative for myalgias, joint pain and falls.  Skin: Negative for itching and rash.  Neurological: Negative for dizziness, tingling, tremors, sensory change, speech change, focal weakness, seizures, loss of consciousness, weakness and headaches.  Endo/Heme/Allergies: Does not bruise/bleed easily.  Psychiatric/Behavioral: Negative for depression, suicidal ideas, memory loss and substance abuse. The patient is not nervous/anxious and does not have insomnia.    14 point ROS was done and is otherwise as detailed above or in HPI  PHYSICAL EXAMINATION: ECOG PERFORMANCE STATUS: 0 - Asymptomatic  Filed Vitals:   03/15/15 0800  BP: 139/74  Pulse: 66  Temp: 97.5 F (36.4 C)  Resp: 16   Filed Weights   03/15/15 0800  Weight:  157 lb 3.2 oz (71.305 kg)    Physical Exam  Constitutional: She is oriented to person, place, and time and well-developed, well-nourished, and in no distress.  HENT:  Head: Normocephalic and atraumatic.  Nose: Nose normal.  Mouth/Throat: Oropharynx is clear and moist. No oropharyngeal exudate.  R surgical incision site anterior to ear and down neck well healed. Swelling and erythema gone. Eyes: Conjunctivae and EOM are normal. Pupils are equal, round, and reactive to light. Right eye exhibits no discharge. Left eye exhibits no discharge. No scleral icterus.  Neck: Normal range of motion. Neck supple. No tracheal deviation present. No thyromegaly present.  Cardiovascular: Normal rate, regular rhythm and normal heart sounds.  Exam reveals no gallop and no friction rub.   No murmur heard. Pulmonary/Chest: Effort normal and breath sounds normal. She has no wheezes. She has no  rales.  Sternotomy site well healed  Abdominal: Soft. Bowel sounds are normal. She exhibits no distension and no mass. There is no tenderness. There is no rebound and no guarding.  Musculoskeletal: Normal range of motion. She exhibits no edema.  Lymphadenopathy:    She has no cervical adenopathy.  Neurological: She is alert and oriented to person, place, and time. She has normal reflexes. No cranial nerve deficit. Gait normal. Coordination normal.  Skin: Skin is warm and dry. No rash noted.  Psychiatric: Mood, memory, affect and judgment normal.  Nursing note and vitals reviewed.    LABORATORY DATA:  I have reviewed the data as listed Lab Results  Component Value Date   WBC 13.4* 10/03/2014   HGB 13.0 11/16/2014   HCT 33.7* 10/03/2014   MCV 88.5 10/03/2014   PLT 161 10/03/2014     Chemistry      Component Value Date/Time   NA 137 11/15/2014 1400   K 3.0* 11/15/2014 1400   CL 102 11/15/2014 1400   CO2 27 11/15/2014 1400   BUN 14 11/15/2014 1400   CREATININE 0.70 11/15/2014 1400      Component Value Date/Time   CALCIUM 8.9 11/15/2014 1400   ALKPHOS 47 10/03/2014 0420   AST 27 10/03/2014 0420   ALT 44 10/03/2014 0420   BILITOT 1.4* 10/03/2014 0420       RADIOGRAPHIC STUDIES: I have personally reviewed the radiological images as listed and agreed with the findings in the report.  CLINICAL DATA: Initial treatment strategy for right parotid adenocarcinoma status post parotidectomy.  EXAM: NUCLEAR MEDICINE PET SKULL BASE TO THIGH  TECHNIQUE: 7.69 mCi F-18 FDG was injected intravenously. Full-ring PET imaging was performed from the skull base to thigh after the radiotracer. CT data was obtained and used for attenuation correction and anatomic localization.  FASTING BLOOD GLUCOSE: Value: 108 mg/dl  COMPARISON: Neck CT 08/24/2014. Chest CT 08/27/2014.  FINDINGS: NECK  No hypermetabolic cervical lymph nodes are identified.There are no lesions of the  pharyngeal mucosal space. There is postsurgical activity superficial to the right parotid gland with an SUV max of 5.0. No focal mass lesion identified.  CHEST  Interval resection of anterior mediastinal mass (benign thyroid tissue at pathology). There are no hypermetabolic mediastinal, hilar or axillary lymph nodes. There is some activity associated with the median sternotomy. There is no suspicious pulmonary activity. 5 mm left upper lobe nodule on image number 16 is stable and without abnormal metabolic activity.  ABDOMEN/PELVIS  There is no hypermetabolic activity within the liver, adrenal glands, spleen or pancreas. There is no hypermetabolic nodal activity. There are postsurgical changes, including previous cholecystectomy  and partial hysterectomy. Mild atherosclerosis noted.  SKELETON  There is no hypermetabolic activity to suggest osseous metastatic disease. Lower lumbar spine facet degenerative changes are noted.  IMPRESSION: 1. Interval resection of right parotid tumor without suspicious residual activity. 2. Interval resection of anterior mediastinal mass. 3. No evidence of metastatic disease or suspicious metabolic activity. 4. Stable 5 mm left upper lobe pulmonary nodule from chest CT of 3 months ago. Fleischner criteria do not apply given the patient's history. Consider follow-up chest CT in 6-12 months.   Electronically Signed  By: Richardean Sale M.D.  On: 12/07/2014 10:25   ASSESSMENT & PLAN:  Adenocarcinoma of the Right Parotid Gland Final pathology of the parotid gland showing an adenocarcinoma, intermediate to focally high grade, nonspecific intercalated duct type, 1.8 cm in greatest dimension, no perineural nor angiolymphatic invasion margins are negative (second opinion at [facility]) pT1,pNX Right lateral parotidectomy with facial nerve dissection and preservation on 11/16/2014 with Dr. Benjamine Mola Partial sternotomy with excision of mediastinal  mass and sternal plating by Dr. 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 Isidore Moos  She last saw survivorship at [facility] with Mike Craze, NP on 02/17/15.  She continues to follow regularly with her PCP, Dr. Scotty Court.  We will repeat PET imaging in 3 months with follow up after to discuss these results. We are going to follow-up with the L pulmonary nodule, her adenocarcinoma of the parotid. We will regroup post PET to review.   All questions were answered. The patient knows to call the clinic with any problems, questions or concerns.   This document serves as a record of services personally performed by Ancil Linsey, MD. It was created on her behalf by Arlyce Harman, a trained medical scribe. The creation of this record is based on the scribe's personal observations and the provider's statements to them. This document has been checked and approved by the attending provider.  I have reviewed the above documentation for accuracy and completeness, and I agree with the above.  This note was electronically signed.    Kelby Fam. Whitney Muse, MD

## 2015-03-23 NOTE — Progress Notes (Signed)
  Ms. Slack presents for follow up of radiation completed 02/09/2015 to her Right Parotid Gland.   Pain issues, if any: no Using a feeding tube?: No Weight changes, if any:  Wt Readings from Last 3 Encounters:  03/30/15 160 lb 14.4 oz (72.984 kg)  03/15/15 157 lb 3.2 oz (71.305 kg)  03/09/15 157 lb (71.215 kg)   Swallowing issues, if any: No Smoking or chewing tobacco? No Using fluoride trays daily? N/A Last ENT visit was on: She saw Dr. Benjamine Mola before Christmas. He cleaned out her ear of skin flakes, but she had no other problems.  Other notable issues, if any:  She saw a dentist before Christmas also, and had her teeth cleaned without any complications.

## 2015-03-30 ENCOUNTER — Ambulatory Visit
Admission: RE | Admit: 2015-03-30 | Discharge: 2015-03-30 | Disposition: A | Discharge status: Home / Self Care | Payer: 59 | Source: Ambulatory Visit | Attending: Radiation Oncology | Admitting: Radiation Oncology

## 2015-03-30 ENCOUNTER — Encounter: Payer: Self-pay | Admitting: Radiation Oncology

## 2015-03-30 VITALS — BP 166/81 | HR 67 | Temp 97.8°F | Ht 62.0 in | Wt 160.9 lb

## 2015-03-30 DIAGNOSIS — C07 Malignant neoplasm of parotid gland: Secondary | ICD-10-CM

## 2015-03-30 NOTE — Progress Notes (Signed)
  Radiation Oncology         [phone] ________________________________  Name: [name] MRN: [id number]  Date: 03/30/2015  DOB: April 28, 1940  Follow-Up Visit Note  CC: Deloria Lair, MD  Whitney Muse Kelby Fam, MD  Diagnosis and Prior Radiotherapy:       ICD-9-CM ICD-10-CM   1. Malignant tumor of parotid gland ([place]) 142.0 C07     pT1Nx  Stage I (clinical T1N0M0) adenocarcinoma, right parotid gland; int to high grade, no PNI or LVSI, notable for extremely close margins  Indication for treatment:  curative       Radiation treatment dates:  12/30/2014-02/09/2015  Site/dose:   Right parotid bed / 60 Gy in 30 fractions  Narrative:  The patient returns today for routine follow-up. Doing well, no complaints. Skin has healed well.  PET scheduled in March, and will see Dr Whitney Muse thereafter. Following w/ Dr Benjamine Mola as well.                 ALLERGIES:  is allergic to penicillins and sulfonamide derivatives.  Meds: Current Outpatient Prescriptions  Medication Sig Dispense Refill  . aspirin EC 81 MG tablet Take 81 mg by mouth daily.    Marland Kitchen lisinopril-hydrochlorothiazide (PRINZIDE,ZESTORETIC) 10-12.5 MG per tablet Take 1 tablet by mouth daily. 90 tablet 1  . metoprolol tartrate (LOPRESSOR) 25 MG tablet Take 1 tablet (25 mg total) by mouth 2 (two) times daily. 180 tablet 1  . Multiple Vitamins-Minerals (HAIR/SKIN/NAILS PO) Take 1 tablet by mouth daily.    . Omega-3 Fatty Acids (OMEGA-3 2100 PO) Take 1 capsule by mouth at bedtime.    . potassium chloride (K-DUR) 10 MEQ tablet Take 10 mEq by mouth daily.    . ranitidine (ZANTAC) 150 MG tablet Take 150 mg by mouth at bedtime.    Marland Kitchen zinc gluconate 50 MG tablet Take 50 mg by mouth daily.       No current facility-administered medications for this encounter.    Physical Findings: The patient is in no acute distress. Patient is alert and oriented. Wt Readings from Last 3 Encounters:  03/30/15 160 lb 14.4 oz (72.984 kg)  03/15/15 157 lb 3.2 oz  (71.305 kg)  03/09/15 157 lb (71.215 kg)    height is 5\' 2"  (1.575 m) and weight is 160 lb 14.4 oz (72.984 kg). Her temperature is 97.8 F (36.6 C). Her blood pressure is 166/81 and her pulse is 67. Marland Kitchen  General: Alert and oriented, in no acute distress HEENT: Head is normocephalic.  Oropharynx is clear Neck: Neck is notable for no palpable masses in parotid, cervical, post auricular, nor SCV regions Skin: Skin in treatment fields shows satisfactory healing  Lymphatics: see Neck Exam Psychiatric: Judgment and insight are intact. Affect is appropriate.   Lab Findings: Lab Results  Component Value Date   WBC 13.4* 10/03/2014   HGB 13.0 11/16/2014   HCT 33.7* 10/03/2014   MCV 88.5 10/03/2014   PLT 161 10/03/2014    Lab Results  Component Value Date   TSH 0.345* 09/01/2010    Radiographic Findings: No results found.  Impression/Plan:    Head and Neck Cancer Status: NED, healed well from RT  Follow-up in June.  The patient was encouraged to call with any issues or questions before then.  _____________________________________   Eppie Gibson, MD

## 2015-04-12 DIAGNOSIS — H903 Sensorineural hearing loss, bilateral: Secondary | ICD-10-CM | POA: Diagnosis not present

## 2015-04-12 MED FILL — METOPROLOL TARTRATE 25 MG T: 25 | 90 days supply | Qty: 180 | Fill #0

## 2015-04-21 MED FILL — POTASSIUM CL ER 10 MEQ TAB: 10 | 90 days supply | Qty: 180 | Fill #1

## 2015-05-06 MED FILL — LISINOPRIL-HCTZ 10-12.5 MG: 10-12.5 | 90 days supply | Qty: 90 | Fill #0 | Status: TO

## 2015-05-10 ENCOUNTER — Other Ambulatory Visit: Payer: Self-pay

## 2015-05-10 NOTE — Patient Outreach (Signed)
[facility]) Care Management  05/10/2015  [name] Jan 20, 1941 [id number]  Phone call for high cost referral assessment.   States that she has been doing good and she has been back to work since a few weeks after her surgery.  States she is seen at the [facility] regularly for scans.  States she still has some soreness in her jaw.  States her blood pressure is controlled with her medication.  States she does not check her blood pressure at home.  States she is planning on retiring soon.  Denies any needs at this time. Issued a voucher for blood pressure monitor to get at the outpatient pharmacy and instructed on how to pick up. Instructed on how to contact RNCM if her needs change. Member assessed with no further needs at this time. Peter Garter RN, [facility] Care Management Coordinator-Link to [place] Management [phone]

## 2015-05-26 DIAGNOSIS — R5381 Other malaise: Secondary | ICD-10-CM | POA: Diagnosis not present

## 2015-05-26 DIAGNOSIS — I10 Essential (primary) hypertension: Secondary | ICD-10-CM | POA: Diagnosis not present

## 2015-05-26 DIAGNOSIS — R5383 Other fatigue: Secondary | ICD-10-CM | POA: Diagnosis not present

## 2015-06-03 ENCOUNTER — Ambulatory Visit (HOSPITAL_COMMUNITY)
Admission: RE | Admit: 2015-06-03 | Discharge: 2015-06-03 | Disposition: A | Discharge status: Home / Self Care | Payer: 59 | Source: Ambulatory Visit | Attending: Hematology & Oncology | Admitting: Hematology & Oncology

## 2015-06-03 DIAGNOSIS — C07 Malignant neoplasm of parotid gland: Secondary | ICD-10-CM

## 2015-06-03 DIAGNOSIS — R911 Solitary pulmonary nodule: Secondary | ICD-10-CM | POA: Diagnosis not present

## 2015-06-03 DIAGNOSIS — C089 Malignant neoplasm of major salivary gland, unspecified: Secondary | ICD-10-CM | POA: Diagnosis not present

## 2015-06-03 LAB — GLUCOSE, CAPILLARY: Glucose-Capillary: 79 mg/dL (ref 65–99)

## 2015-06-03 MED ORDER — FLUDEOXYGLUCOSE F - 18 (FDG) INJECTION
7.9000 | Freq: Once | INTRAVENOUS | Status: AC | PRN
  Administered 2015-06-03: 7.9 via INTRAVENOUS

## 2015-06-06 ENCOUNTER — Ambulatory Visit (HOSPITAL_COMMUNITY): Payer: 59

## 2015-06-08 ENCOUNTER — Encounter (HOSPITAL_COMMUNITY): Payer: 59 | Attending: Hematology & Oncology | Admitting: Hematology & Oncology

## 2015-06-08 ENCOUNTER — Encounter (HOSPITAL_COMMUNITY): Payer: Self-pay | Admitting: Hematology & Oncology

## 2015-06-08 VITALS — BP 119/73 | HR 85 | Temp 97.4°F | Resp 18 | Wt 159.3 lb

## 2015-06-08 DIAGNOSIS — C07 Malignant neoplasm of parotid gland: Secondary | ICD-10-CM | POA: Diagnosis not present

## 2015-06-08 DIAGNOSIS — E876 Hypokalemia: Secondary | ICD-10-CM | POA: Diagnosis not present

## 2015-06-08 DIAGNOSIS — R911 Solitary pulmonary nodule: Secondary | ICD-10-CM | POA: Diagnosis not present

## 2015-06-08 NOTE — Progress Notes (Signed)
[place] [facility] Note  Patient Care Team: Zella Richer. Scotty Court, MD as PCP - General (Unknown Physician Specialty) Rogene Houston, MD (Gastroenterology) Leta Baptist, MD as Consulting Physician (Otolaryngology)  CHIEF COMPLAINTS/PURPOSE OF CONSULTATION:  Adenocarcinoma of the Right Parotid Gland Final pathology of the parotid gland showing an adenocarcinoma, intermediate to focally high grade, nonspecific intercalated duct type, 1.8 cm in greatest dimension, no perineural nor angiolymphatic invasion margins are negative (second opinion at [facility]) pT1,pNX Right lateral parotidectomy with facial nerve dissection and preservation on 11/16/2014 with Dr. Benjamine Mola Partial sternotomy with excision of mediastinal mass and sternal plating by Dr. 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 female is here for follow-up of adenocarcinoma of the parotid.  [name] was here alone today.  She has had some pain in her jaw that is especially bad at night. She says it is tender when she opens and closes her mouth. She thought that this may be because of her surgery. She went to the dentist in December or January and had no problems. She has been doing her exercises for her mouth. She might not have done these as much as she should have when she had radiation but has been doing it since then.   Her PET scan looked good. She notes that she had a mammogram before Christmas.   She is retiring in 30 days after she finishes training her replacement. She is going to [place] this summer.  She sees Dr. Isidore Moos in June. She has no other major complaints or concerns. She is overall feeling well.   MEDICAL HISTORY:  Past Medical History  Diagnosis Date  . Paroxysmal atrial fibrillation (HCC)     normal echiocardiogram and stress nuclear; low TSH with normal T3  and T4   . HTN (hypertension)   . 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 Low TSH level 01/2010  . Neuromuscular disorder ([place])   . PONV (postoperative nausea and vomiting)     SURGICAL HISTORY: Past Surgical History  Procedure Laterality Date  . Vein ligation and stripping    . Appendectomy    . Cholecystectomy    . Abdominal hysterectomy      w/o oophorectomy  . Bunionectomy      bilateral  . Tonsillectomy    . Tonsillectomy    . Esophagogastroduodenoscopy  12/27/2011    Procedure: ESOPHAGOGASTRODUODENOSCOPY (EGD);  Surgeon: Rogene Houston, MD;  [facility];  Service: Endoscopy;  Laterality: N/A;  250  . Resection of mediastinal mass N/A 10/01/2014    Procedure: RESECTION OF MEDIASTINAL MASS;  Surgeon: Grace Isaac, MD;  Location: [place];  Service: Thoracic;  Laterality: N/A;  . Sternotomy N/A 10/01/2014    Procedure: STERNOTOMY;  Surgeon: Grace Isaac, MD;  Location: [place];  Service: Thoracic;  Laterality: N/A;  . Parotidectomy Right 11/16/2014    Procedure: RIGHT PAROTIDECTOMY;  Surgeon: Leta Baptist, MD;  Location: [place];  Service: ENT;  Laterality: Right;  . Colonoscopy  03/09/15  . Colonoscopy N/A 03/09/2015    Procedure: COLONOSCOPY;  Surgeon: Rogene Houston, MD;  [facility];  Service: Endoscopy;  Laterality: N/A;  830    SOCIAL HISTORY: Social History   Social History  . Marital Status: Married  Spouse Name: N/A  . Number of Children: N/A  . Years of Education: N/A   Occupational History  . Not on file.   Social History Main Topics  . Smoking status: Never Smoker   . Smokeless tobacco: Never Used     Comment: does not smoke  . Alcohol Use: 0.0 oz/week    0 Standard drinks or equivalent per week     Comment: 1 glass of wine at a time just socially  . Drug Use: No  . Sexual Activity: Not on file   Other Topics Concern  . Not on file   Social History  Narrative   Married, employment - Network engineer. No caffeine. 13 yr education   Married for 57 years. 1 child. No grandchildren.  She plays bridge, the organ at church and enjoys traveling. She works at [employer] She is a non-smoker. Her husband used to smoke but quit in 1989. No ETOH, rare wine  FAMILY HISTORY: Family History  Problem Relation Age of Onset  . Heart disease Father   . Hypertension Father   . Diabetes Mother   . Hypertension Mother    indicated that her mother is deceased. She reported the following about her father: conduction system disease requiring pacemaker implantation. She reported the following about her sister: Hx of cardiac disease and pulmonary embolism .   Mother died at 44 from dementia (Alzheimer's ?) Father died at 2 from complications of appendicitis 1 sister who is healthy, she is obese and has some "heart issues."  ALLERGIES:  is allergic to penicillins and sulfonamide derivatives.  MEDICATIONS:  Current Outpatient Prescriptions  Medication Sig Dispense Refill  . aspirin EC 81 MG tablet Take 81 mg by mouth daily.    Marland Kitchen lisinopril-hydrochlorothiazide (PRINZIDE,ZESTORETIC) 10-12.5 MG per tablet Take 1 tablet by mouth daily. 90 tablet 1  . metoprolol tartrate (LOPRESSOR) 25 MG tablet Take 1 tablet (25 mg total) by mouth 2 (two) times daily. 180 tablet 1  . Multiple Vitamins-Minerals (HAIR/SKIN/NAILS PO) Take 1 tablet by mouth daily.    . Omega-3 Fatty Acids (OMEGA-3 2100 PO) Take 1 capsule by mouth at bedtime.    . potassium chloride (K-DUR) 10 MEQ tablet Take 10 mEq by mouth daily.    . ranitidine (ZANTAC) 150 MG tablet Take 150 mg by mouth at bedtime.    Marland Kitchen zinc gluconate 50 MG tablet Take 50 mg by mouth daily.       No current facility-administered medications for this visit.    Review of Systems  Constitutional: Negative for fever, chills, weight loss and malaise/fatigue.  HENT: Negative for congestion, hearing loss, nosebleeds, sore throat and tinnitus.     Eyes: Negative for blurred vision, double vision, pain and discharge.  Respiratory: Negative for cough, hemoptysis, sputum production, shortness of breath and wheezing.   Cardiovascular: Negative for palpitations, claudication, chest pain, leg swelling and PND.  Gastrointestinal: Negative for heartburn, nausea, vomiting, abdominal pain, diarrhea, constipation, blood in stool and melena.  Genitourinary: Negative for dysuria, urgency, frequency and hematuria.  Musculoskeletal: Positive for jaw pain. Negative for myalgias, joint pain and falls.  Tender when she opens and closes her mouth. Is worse at night.  Skin: Negative for itching and rash.  Neurological: Negative for dizziness, tingling, tremors, sensory change, speech change, focal weakness, seizures, loss of consciousness, weakness and headaches.  Endo/Heme/Allergies: Does not bruise/bleed easily.  Psychiatric/Behavioral: Negative for depression, suicidal ideas, memory loss and substance abuse. The patient is not nervous/anxious and does not have insomnia.  14 point ROS was done and is otherwise as detailed above or in HPI  PHYSICAL EXAMINATION: ECOG PERFORMANCE STATUS: 0 - Asymptomatic  Filed Vitals:   06/08/15 1000  BP: 119/73  Pulse: 85  Temp: 97.4 F (36.3 C)  Resp: 18   Filed Weights   06/08/15 1000  Weight: 159 lb 4.8 oz (72.258 kg)    Physical Exam  Constitutional: She is oriented to person, place, and time and well-developed, well-nourished, and in no distress.  HENT:  Head: Normocephalic and atraumatic.  Nose: Nose normal.  Mouth/Throat: Oropharynx is clear and moist. No oropharyngeal exudate.  R surgical incision site anterior to ear and down neck well healed. Swelling and erythema gone. Eyes: Conjunctivae and EOM are normal. Pupils are equal, round, and reactive to light. Right eye exhibits no discharge. Left eye exhibits no discharge. No scleral icterus.  Neck: Normal range of motion. Neck supple. No tracheal  deviation present. No thyromegaly present.  Cardiovascular: Normal rate, regular rhythm and normal heart sounds.  Exam reveals no gallop and no friction rub.   No murmur heard. Pulmonary/Chest: Effort normal and breath sounds normal. She has no wheezes. She has no rales.  Sternotomy site well healed  Abdominal: Soft. Bowel sounds are normal. She exhibits no distension and no mass. There is no tenderness. There is no rebound and no guarding.  Musculoskeletal: Normal range of motion. She exhibits no edema.  Lymphadenopathy:    She has no cervical adenopathy.  Neurological: She is alert and oriented to person, place, and time. She has normal reflexes. No cranial nerve deficit. Gait normal. Coordination normal.  Skin: Skin is warm and dry. No rash noted.  Psychiatric: Mood, memory, affect and judgment normal.  Nursing note and vitals reviewed.   LABORATORY DATA:  I have reviewed the data as listed Lab Results  Component Value Date   WBC 13.4* 10/03/2014   HGB 13.0 11/16/2014   HCT 33.7* 10/03/2014   MCV 88.5 10/03/2014   PLT 161 10/03/2014     Chemistry      Component Value Date/Time   NA 137 11/15/2014 1400   K 3.0* 11/15/2014 1400   CL 102 11/15/2014 1400   CO2 27 11/15/2014 1400   BUN 14 11/15/2014 1400   CREATININE 0.70 11/15/2014 1400      Component Value Date/Time   CALCIUM 8.9 11/15/2014 1400   ALKPHOS 47 10/03/2014 0420   AST 27 10/03/2014 0420   ALT 44 10/03/2014 0420   BILITOT 1.4* 10/03/2014 0420       RADIOGRAPHIC STUDIES: I have personally reviewed the radiological images as listed and agreed with the findings in the report. Study Result     CLINICAL DATA: Subsequent treatment strategy for right parotid adenocarcinoma.  EXAM: NUCLEAR MEDICINE PET SKULL BASE TO THIGH  TECHNIQUE: 7.9 mCi F-18 FDG was injected intravenously. Full-ring PET imaging was performed from the skull base to thigh after the radiotracer. CT data was obtained and used for  attenuation correction and anatomic localization.  FASTING BLOOD GLUCOSE: Value: 79 mg/dl  COMPARISON: 12/07/2014  FINDINGS: NECK  No hypermetabolic lymph nodes in the neck.  CHEST  There is a small nodule within the left upper lobe measuring 5 mm, image number 17 of series 8. Unchanged from previous exam and too small to characterize by PET-CT. No hypermetabolic lymph nodes identified within the mediastinum or hilar regions.  ABDOMEN/PELVIS  No abnormal hypermetabolic activity within the liver, pancreas, adrenal glands, or spleen. No hypermetabolic lymph nodes  in the abdomen or pelvis.  SKELETON  No focal hypermetabolic activity to suggest skeletal metastasis.  IMPRESSION: 1. No evidence for residual or recurrent hypermetabolic tumor. 2. The pulmonary nodule in the left upper lobe is unchanged measuring 5 mm and remains nonspecific.   Electronically Signed  By: Kerby Moors M.D.  On: 06/03/2015 10:23       ASSESSMENT & PLAN:  Adenocarcinoma of the Right Parotid Gland Final pathology of the parotid gland showing an adenocarcinoma, intermediate to focally high grade, nonspecific intercalated duct type, 1.8 cm in greatest dimension, no perineural nor angiolymphatic invasion margins are negative (second opinion at [facility]) pT1,pNX Right lateral parotidectomy with facial nerve dissection and preservation on 11/16/2014 with Dr. Benjamine Mola Partial sternotomy with excision of mediastinal mass and sternal plating by Dr. 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 Isidore Moos Hypokalemia/on potassium. Followed by Dr. Scotty Court  She last saw survivorship at [facility] with Mike Craze, NP on 02/17/15.  She continues to follow regularly with her PCP, Dr. Scotty Court. She is on potassium for hypokalemia.   Her PET scan looked good.  Pulmonary nodule is stable. This will continue to be followed.   She had a mammogram before Christmas.   She sees Dr. Isidore Moos in June.  She will return in September for ongoing follow-up and physical exam.   All questions were answered. The patient knows to call the clinic with any problems, questions or concerns.   This document serves as a record of services personally performed by Ancil Linsey, MD. It was created on her behalf by Kandace Blitz, a trained medical scribe. The creation of this record is based on the scribe's personal observations and the provider's statements to them. This document has been checked and approved by the attending provider.  I have reviewed the above documentation for accuracy and completeness, and I agree with the above.  This note was electronically signed.    Kelby Fam. Whitney Muse, MD

## 2015-06-08 NOTE — Patient Instructions (Addendum)
[place] at [facility] Discharge Instructions  RECOMMENDATIONS MADE BY THE CONSULTANT AND ANY TEST RESULTS WILL BE SENT TO YOUR REFERRING PHYSICIAN.   Exam and discussion by Dr Whitney Muse today CT scans were good Keep doing your exercises for your jaw   Return to see the doctor in September  Please call the clinic if you have any questions or concerns    Thank you for choosing [place] at [facility] to provide your oncology and hematology care.  To afford each patient quality time with our provider, please arrive at least 15 minutes before your scheduled appointment time.   Beginning January 23rd 2017 lab work for the [facility] will be done in the  Main lab at [facility] on 1st floor. If you have a lab appointment with the [facility] please come in thru the  Main Entrance and check in at the main information desk  You need to re-schedule your appointment should you arrive 10 or more minutes late.  We strive to give you quality time with our providers, and arriving late affects you and other patients whose appointments are after yours.  Also, if you no show three or more times for appointments you may be dismissed from the clinic at the providers discretion.     Again, thank you for choosing [facility].  Our hope is that these requests will decrease the amount of time that you wait before being seen by our physicians.       _____________________________________________________________  Should you have questions after your visit to [facility], please contact our office at [phone] between the hours of 8:30 a.m. and 4:30 p.m.  Voicemails left after 4:30 p.m. will not be returned until the following business day.  For prescription refill requests, have your pharmacy contact our office.         Resources For Cancer Patients and their Caregivers ? American Cancer Society: Can assist with  transportation, wigs, general needs, runs Look Good Feel Better.        973-150-4962 ? Cancer Care: Provides financial assistance, online support groups, medication/co-pay assistance.  1-800-813-HOPE (606)709-0979) ? [place] Assists [place] cancer patients and their families through emotional , educational and financial support.  8286750019 ? [place] DSS Where to apply for food stamps, Medicaid and utility assistance. (813)335-5658 ? [facility]: Transportation to medical appointments. 321 734 1854 ? Social Security Administration: May apply for disability if have a Stage IV cancer. 215-395-4252 301-231-2165 ? [employer], Disability and Transit Services: Assists with nutrition, care and transit needs. (548)[phone]

## 2015-06-13 DIAGNOSIS — H04129 Dry eye syndrome of unspecified lacrimal gland: Secondary | ICD-10-CM | POA: Diagnosis not present

## 2015-06-13 DIAGNOSIS — H00015 Hordeolum externum left lower eyelid: Secondary | ICD-10-CM | POA: Diagnosis not present

## 2015-06-13 DIAGNOSIS — H40013 Open angle with borderline findings, low risk, bilateral: Secondary | ICD-10-CM | POA: Diagnosis not present

## 2015-06-13 DIAGNOSIS — H524 Presbyopia: Secondary | ICD-10-CM | POA: Diagnosis not present

## 2015-06-14 DIAGNOSIS — K644 Residual hemorrhoidal skin tags: Secondary | ICD-10-CM | POA: Diagnosis not present

## 2015-06-14 NOTE — Patient Instructions (Signed)
[name]  06/14/2015     @PREFPERIOPPHARMACY @   Your procedure is scheduled on 06/20/2015.  Report to [name] at 7:30 A.M.  Call this number if you have problems the morning of surgery:  [phone]   Remember:  Do not eat food or drink liquids after midnight.  Take these medicines the morning of surgery with A SIP OF WATER Metoprolol, Zantac   Do not wear jewelry, make-up or nail polish.  Do not wear lotions, powders, or perfumes.  You may wear deodorant.  Do not shave 48 hours prior to surgery.  Men may shave face and neck.  Do not bring valuables to the hospital.  [facility] is not responsible for any belongings or valuables.  Contacts, dentures or bridgework may not be worn into surgery.  Leave your suitcase in the car.  After surgery it may be brought to your room.  For patients admitted to the hospital, discharge time will be determined by your treatment team.  Patients discharged the day of surgery will not be allowed to drive home.    Please read over the following fact sheets that you were given. Surgical Site Infection Prevention and Anesthesia Post-op Instructions     PATIENT INSTRUCTIONS POST-ANESTHESIA  IMMEDIATELY FOLLOWING SURGERY:  Do not drive or operate machinery for the first twenty four hours after surgery.  Do not make any important decisions for twenty four hours after surgery or while taking narcotic pain medications or sedatives.  If you develop intractable nausea and vomiting or a severe headache please notify your doctor immediately.  FOLLOW-UP:  Please make an appointment with your surgeon as instructed. You do not need to follow up with anesthesia unless specifically instructed to do so.  WOUND CARE INSTRUCTIONS (if applicable):  Keep a dry clean dressing on the anesthesia/puncture wound site if there is drainage.  Once the wound has quit draining you may leave it open to air.  Generally you should leave the bandage intact for twenty  four hours unless there is drainage.  If the epidural site drains for more than 36-48 hours please call the anesthesia department.  QUESTIONS?:  Please feel free to call your physician or the hospital operator if you have any questions, and they will be happy to assist you.      Surgical Procedures for Hemorrhoids Surgical procedures can be used to treat hemorrhoids. Hemorrhoids are swollen veins that are inside the rectum (internal hemorrhoids) or around the anus (external hemorrhoids). They are caused by increased pressure in the anal area. This pressure may result from straining to have a bowel movement (constipation), diarrhea, pregnancy, obesity, anal sex, or sitting for long periods of time. Hemorrhoids can cause symptoms such as pain and bleeding. Surgery may be needed if diet changes, lifestyle changes, and other treatments do not help your symptoms. Various surgical methods may be used. Three common methods are:  Closed hemorrhoidectomy. The hemorrhoids are surgically removed, and the surgical cuts (incisions) are closed with stitches (sutures).  Open hemorrhoidectomy. The hemorrhoids are surgically removed, but the incisions are allowed to heal without sutures.  Stapled hemorrhoidopexy. The hemorrhoids are removed using a device that takes out a ring of excess tissue. LET [facility] CARE PROVIDER KNOW ABOUT:  Any allergies you have.  All medicines you are taking, including vitamins, herbs, eye drops, creams, and over-the-counter medicines.  Previous problems you or members of your family have had with the use of anesthetics.  Any blood disorders you have.  Previous surgeries you have had.  Any medical conditions you have.  Whether you are pregnant or may be pregnant. RISKS AND COMPLICATIONS Generally, this is a safe procedure. However, problems may occur, including:  Infection.  Bleeding.  Allergic reactions to medicines.  Damage to other structures or  organs.  Pain.  Constipation.  Difficulty passing urine.  Narrowing of the anal canal (stenosis).  Difficulty controlling bowel movements (incontinence). BEFORE THE PROCEDURE  Ask your health care provider about:  Changing or stopping your regular medicines. This is especially important if you are taking diabetes medicines or blood thinners.  Taking medicines such as aspirin and ibuprofen. These medicines can thin your blood. Do not take these medicines before your procedure if your health care provider instructs you not to.  You may need to have a procedure to examine the inside of your colon with a scope (colonoscopy). Your health care provider may do this to make sure that there are no other causes for your bleeding or pain.  Follow instructions from your health care provider about eating or drinking restrictions.  You may be instructed to take a laxative and an enema to clean out your colon before surgery (bowel prep). Carefully follow instructions from your health care provider about bowel prep.  Ask your health care provider how your surgical site will be marked or identified.  You may be given antibiotic medicine to help prevent infection.  Plan to have someone take you home after the procedure. PROCEDURE  To reduce your risk of infection:  Your health care team will wash or sanitize their hands.  Your skin will be washed with soap.  An IV tube will be inserted into one of your veins.  You will be given one or more of the following:  A medicine to help you relax (sedative).  A medicine to numb the area (local anesthetic).  A medicine to make you fall asleep (general anesthetic).  A medicine that is injected into an area of your body to numb everything below the injection site (regional anesthetic).  A lubricating jelly may be placed into your rectum.  Your surgeon will insert a short scope (anoscope) into your rectum to examine the hemorrhoids.  One of the  following hemorrhoid procedures will be performed. Closed Hemorrhoidectomy  Your surgeon will use surgical instruments to open the tissue around the hemorrhoids.  The veins that supply the hemorrhoids will be tied off with a suture.  The hemorrhoids will be removed.  The tissue that surrounds the hemorrhoids will be closed with sutures that your body can absorb (absorbable sutures). Open Hemorrhoidectomy  The hemorrhoids will be removed with surgical instruments.  The incisions will be left open to heal without sutures. Stapled Hemorrhoidopexy  Your surgeon will use a circular stapling device to remove the hemorrhoids.  The device will be inserted into your anus. It will remove a circular ring of tissue that includes hemorrhoid tissue and some tissue above the hemorrhoids.  The staples in the device will close the edges of removed tissue. This will cut off the blood supply to the hemorrhoids and will pull any remaining hemorrhoids back into place. Each of these procedures may vary among health care providers and hospitals. AFTER THE PROCEDURE  Your blood pressure, heart rate, breathing rate, and blood oxygen level will be monitored often until the medicines you were given have worn off.  You will be given pain medicine as needed.   This information is not intended to replace advice  given to you by your health care provider. Make sure you discuss any questions you have with your health care provider.   Document Released: 12/31/2008 Document Revised: 11/24/2014 Document Reviewed: 05/31/2014 Elsevier Interactive Patient Education [employer].

## 2015-06-15 NOTE — H&P (Signed)
  NTS SOAP Note  Vital Signs:  Vitals as of: 123456: Systolic [id number]: Diastolic 83: Heart Rate 73: Temp 97.28F (Temporal): Height 107ft 2in: Weight 159Lbs 0 Ounces: BMI 29.08   BMI : 29.08 kg/m2  Subjective: This 75 year old female presents for of hemorrhoidal excess tissue.  Has present for some time now.  Had recent TCS which did not show an anal fissure.  No blood per rectum noted.  Review of Symptoms: unknown   Past Medical History:  Reviewed  Past Medical History  Surgical History: cholecystectomy, appy, parotid gland excision, mediastinal exploration Medical Problems: HTN, neuromuscular disease, parotid gland cancer Allergies: pcn, sulfa Medications: prinzide, baby ASA, lopressor, kdur, zantac   Social History:Reviewed  Social History  Preferred Language: English Race:  White Ethnicity: Not Hispanic / Latino Age: 16 year Marital Status:  M Alcohol: socially   Smoking Status: Never smoker reviewed on 06/14/2015 Functional Status reviewed on 06/14/2015 ------------------------------------------------ Bathing: Normal Cooking: Normal Dressing: Normal Driving: Normal Eating: Normal Managing Meds: Normal Oral Care: Normal Shopping: Normal Toileting: Normal Transferring: Normal Walking: Normal Cognitive Status reviewed on 06/14/2015 ------------------------------------------------ Attention: Normal Decision Making: Normal Language: Normal Memory: Normal Motor: Normal Perception: Normal Problem Solving: Normal Visual and Spatial: Normal   Family History:Reviewed  Family Health History Family History is Unknown    Objective Information: General:Well appearing, well nourished in no distress. Heart:RRR, no murmur or gallop.  Normal S1, S2.  No S3, S4.  Lungs:  CTA bilaterally, no wheezes, rhonchi, rales.  Breathing unlabored. Enlarge posterior anal hemorrhoid skin tag with prolapse, no anal fissure or stricture.  Assessment:Hemorrhoidal skin  tag  Diagnoses: 455.5  K64.4 Prolapsed external hemorrhoids (Residual hemorrhoidal skin tags)  Procedures: [id number] - OFFICE OUTPATIENT NEW 30 MINUTES    Plan:  Scheduled for simple hemorrhoidectomy on 06/20/15.   Patient Education:Alternative treatments to surgery were discussed with patient (and family).  Risks and benefits  of procedure were fully explained to the patient (and family) who gave informed consent. Patient/family questions were addressed.  Follow-up:Pending Surgery

## 2015-06-16 ENCOUNTER — Encounter (HOSPITAL_COMMUNITY): Payer: Self-pay

## 2015-06-16 ENCOUNTER — Encounter (HOSPITAL_COMMUNITY)
Admission: RE | Admit: 2015-06-16 | Discharge: 2015-06-16 | Disposition: A | Discharge status: Home / Self Care | Payer: 59 | Source: Ambulatory Visit | Attending: General Surgery | Admitting: General Surgery

## 2015-06-16 DIAGNOSIS — Z01812 Encounter for preprocedural laboratory examination: Secondary | ICD-10-CM | POA: Diagnosis not present

## 2015-06-16 LAB — BASIC METABOLIC PANEL
Anion gap: 8 (ref 5–15)
BUN: 17 mg/dL (ref 6–20)
CALCIUM: 8.9 mg/dL (ref 8.9–10.3)
CO2: 25 mmol/L (ref 22–32)
CREATININE: 0.72 mg/dL (ref 0.44–1.00)
Chloride: 106 mmol/L (ref 101–111)
GFR calc Af Amer: >60 mL/min (ref >60)
Glucose, Bld: 103 mg/dL — ABNORMAL HIGH (ref 65–99)
Potassium: 3.6 mmol/L (ref 3.5–5.1)
Sodium: 139 mmol/L (ref 135–145)

## 2015-06-16 LAB — CBC WITH DIFFERENTIAL/PLATELET
BASOS ABS: 0.1 10*3/uL (ref 0.0–0.1)
BASOS PCT: 1 %
EOS ABS: 0.1 10*3/uL (ref 0.0–0.7)
Eosinophils Relative: 1 %
HEMATOCRIT: 38.9 % (ref 36.0–46.0)
HEMOGLOBIN: 13.2 g/dL (ref 12.0–15.0)
Lymphocytes Relative: 18 %
Lymphs Abs: 1.1 10*3/uL (ref 0.7–4.0)
MCH: 31.3 pg (ref 26.0–34.0)
MCHC: 33.9 g/dL (ref 30.0–36.0)
MCV: 92.2 fL (ref 78.0–100.0)
Monocytes Absolute: 0.7 10*3/uL (ref 0.1–1.0)
Monocytes Relative: 12 %
NEUTROS ABS: 4.2 10*3/uL (ref 1.7–7.7)
NEUTROS PCT: 68 %
Platelets: 159 10*3/uL (ref 150–400)
RBC: 4.22 MIL/uL (ref 3.87–5.11)
RDW: 13 % (ref 11.5–15.5)
WBC: 6.1 10*3/uL (ref 4.0–10.5)

## 2015-06-16 MED FILL — raNITIdine HCL 150 MG TABS: 150 | 90 days supply | Qty: 90 | Fill #0 | Status: TO

## 2015-06-20 ENCOUNTER — Encounter (HOSPITAL_COMMUNITY)
Admission: RE | Disposition: A | Discharge status: Home / Self Care | Payer: Self-pay | Source: Ambulatory Visit | Attending: General Surgery

## 2015-06-20 ENCOUNTER — Ambulatory Visit (HOSPITAL_COMMUNITY)
Admission: RE | Admit: 2015-06-20 | Discharge: 2015-06-20 | Disposition: A | Discharge status: Home / Self Care | Payer: 59 | Source: Ambulatory Visit | Attending: General Surgery | Admitting: General Surgery

## 2015-06-20 ENCOUNTER — Ambulatory Visit (HOSPITAL_COMMUNITY): Payer: 59 | Admitting: Anesthesiology

## 2015-06-20 ENCOUNTER — Encounter (HOSPITAL_COMMUNITY): Payer: Self-pay | Admitting: *Deleted

## 2015-06-20 DIAGNOSIS — K219 Gastro-esophageal reflux disease without esophagitis: Secondary | ICD-10-CM | POA: Insufficient documentation

## 2015-06-20 DIAGNOSIS — K649 Unspecified hemorrhoids: Secondary | ICD-10-CM | POA: Diagnosis not present

## 2015-06-20 DIAGNOSIS — I10 Essential (primary) hypertension: Secondary | ICD-10-CM | POA: Diagnosis not present

## 2015-06-20 DIAGNOSIS — Z79899 Other long term (current) drug therapy: Secondary | ICD-10-CM | POA: Insufficient documentation

## 2015-06-20 DIAGNOSIS — K648 Other hemorrhoids: Secondary | ICD-10-CM | POA: Insufficient documentation

## 2015-06-20 DIAGNOSIS — C07 Malignant neoplasm of parotid gland: Secondary | ICD-10-CM | POA: Insufficient documentation

## 2015-06-20 DIAGNOSIS — Z7982 Long term (current) use of aspirin: Secondary | ICD-10-CM | POA: Diagnosis not present

## 2015-06-20 HISTORY — PX: HEMORRHOID SURGERY: SHX153

## 2015-06-20 SURGERY — HEMORRHOIDECTOMY
Anesthesia: General | Site: Rectum

## 2015-06-20 MED ORDER — SCOPOLAMINE 1 MG/3DAYS TD PT72
1.0000 | MEDICATED_PATCH | Freq: Once | TRANSDERMAL | Status: DC
  Administered 2015-06-20: 1.5 mg via TRANSDERMAL

## 2015-06-20 MED ORDER — BUPIVACAINE HCL (PF) 0.5 % IJ SOLN
INTRAMUSCULAR | Status: AC
  Filled 2015-06-20: qty 30

## 2015-06-20 MED ORDER — BUPIVACAINE HCL (PF) 0.5 % IJ SOLN
INTRAMUSCULAR | Status: DC | PRN
  Administered 2015-06-20: 6 mL

## 2015-06-20 MED ORDER — DEXAMETHASONE SODIUM PHOSPHATE 4 MG/ML IJ SOLN
4.0000 mg | Freq: Once | INTRAMUSCULAR | Status: AC
  Administered 2015-06-20: 4 mg via INTRAVENOUS

## 2015-06-20 MED ORDER — MIDAZOLAM HCL 2 MG/2ML IJ SOLN
1.0000 mg | INTRAMUSCULAR | Status: DC | PRN
  Administered 2015-06-20: 2 mg via INTRAVENOUS

## 2015-06-20 MED ORDER — ONDANSETRON HCL 4 MG/2ML IJ SOLN
4.0000 mg | Freq: Once | INTRAMUSCULAR | Status: AC
  Administered 2015-06-20: 4 mg via INTRAVENOUS

## 2015-06-20 MED ORDER — LACTATED RINGERS IV SOLN
INTRAVENOUS | Status: DC
  Administered 2015-06-20: 08:00:00 via INTRAVENOUS

## 2015-06-20 MED ORDER — KETOROLAC TROMETHAMINE 30 MG/ML IJ SOLN
30.0000 mg | Freq: Once | INTRAMUSCULAR | Status: AC
  Administered 2015-06-20: 30 mg via INTRAVENOUS
  Filled 2015-06-20: qty 1

## 2015-06-20 MED ORDER — LIDOCAINE HCL (CARDIAC) 20 MG/ML IV SOLN
INTRAVENOUS | Status: DC | PRN
  Administered 2015-06-20: 50 mg via INTRAVENOUS

## 2015-06-20 MED ORDER — FENTANYL CITRATE (PF) 100 MCG/2ML IJ SOLN
INTRAMUSCULAR | Status: AC
  Filled 2015-06-20: qty 2

## 2015-06-20 MED ORDER — ONDANSETRON HCL 4 MG/2ML IJ SOLN: 4.0000 mg | Freq: Once | INTRAMUSCULAR | Status: DC | PRN

## 2015-06-20 MED ORDER — SODIUM CHLORIDE 0.9 % IR SOLN
Status: DC | PRN
  Administered 2015-06-20: 1000 mL

## 2015-06-20 MED ORDER — PROPOFOL 10 MG/ML IV BOLUS
INTRAVENOUS | Status: AC
  Filled 2015-06-20: qty 20

## 2015-06-20 MED ORDER — HYDROCODONE-ACETAMINOPHEN 5-325 MG PO TABS: 1.0000 | ORAL_TABLET | Freq: Four times a day (QID) | ORAL | Status: DC | PRN

## 2015-06-20 MED ORDER — FENTANYL CITRATE (PF) 100 MCG/2ML IJ SOLN: 25.0000 ug | INTRAMUSCULAR | Status: DC | PRN

## 2015-06-20 MED ORDER — MIDAZOLAM HCL 2 MG/2ML IJ SOLN
INTRAMUSCULAR | Status: AC
  Filled 2015-06-20: qty 2

## 2015-06-20 MED ORDER — FENTANYL CITRATE (PF) 100 MCG/2ML IJ SOLN
INTRAMUSCULAR | Status: DC | PRN
Start: 2015-06-20 — End: 2015-06-20
  Administered 2015-06-20 (×2): 25 ug via INTRAVENOUS
  Administered 2015-06-20: 50 ug via INTRAVENOUS

## 2015-06-20 MED ORDER — METRONIDAZOLE IN NACL 5-0.79 MG/ML-% IV SOLN
500.0000 mg | INTRAVENOUS | Status: AC
  Administered 2015-06-20: 500 mg via INTRAVENOUS
  Filled 2015-06-20: qty 100

## 2015-06-20 MED ORDER — DEXAMETHASONE SODIUM PHOSPHATE 4 MG/ML IJ SOLN
INTRAMUSCULAR | Status: AC
  Filled 2015-06-20: qty 1

## 2015-06-20 MED ORDER — LIDOCAINE VISCOUS 2 % MT SOLN
OROMUCOSAL | Status: AC
  Filled 2015-06-20: qty 15

## 2015-06-20 MED ORDER — PROPOFOL 10 MG/ML IV BOLUS
INTRAVENOUS | Status: DC | PRN
  Administered 2015-06-20: 150 mg via INTRAVENOUS

## 2015-06-20 MED ORDER — SCOPOLAMINE 1 MG/3DAYS TD PT72
MEDICATED_PATCH | TRANSDERMAL | Status: AC
  Filled 2015-06-20: qty 1

## 2015-06-20 MED ORDER — ONDANSETRON HCL 4 MG/2ML IJ SOLN
INTRAMUSCULAR | Status: AC
  Filled 2015-06-20: qty 2

## 2015-06-20 MED ORDER — LIDOCAINE VISCOUS 2 % MT SOLN
OROMUCOSAL | Status: DC | PRN
  Administered 2015-06-20: 20 mL

## 2015-06-20 MED ORDER — LIDOCAINE HCL (PF) 1 % IJ SOLN
INTRAMUSCULAR | Status: AC
Start: 2015-06-20 — End: 2015-06-20
  Filled 2015-06-20: qty 5

## 2015-06-20 SURGICAL SUPPLY — 32 items
BAG HAMPER (MISCELLANEOUS) ×2 IMPLANT
CLOTH BEACON ORANGE TIMEOUT ST (SAFETY) ×2 IMPLANT
COVER LIGHT HANDLE STERIS (MISCELLANEOUS) ×4 IMPLANT
COVER MAYO STAND XLG (DRAPE) ×2 IMPLANT
DECANTER SPIKE VIAL GLASS SM (MISCELLANEOUS) ×2 IMPLANT
DRAPE PROXIMA HALF (DRAPES) ×2 IMPLANT
ELECT REM PT RETURN 9FT ADLT (ELECTROSURGICAL) ×2
ELECTRODE REM PT RTRN 9FT ADLT (ELECTROSURGICAL) ×1 IMPLANT
FORMALIN 10 PREFIL 120ML (MISCELLANEOUS) ×2 IMPLANT
GAUZE SPONGE 4X4 12PLY STRL (GAUZE/BANDAGES/DRESSINGS) ×2 IMPLANT
GLOVE BIOGEL PI IND STRL 7.0 (GLOVE) ×1 IMPLANT
GLOVE BIOGEL PI INDICATOR 7.0 (GLOVE) ×1
GLOVE ECLIPSE 6.5 STRL STRAW (GLOVE) ×1 IMPLANT
GLOVE SURG SS PI 7.5 STRL IVOR (GLOVE) ×4 IMPLANT
GOWN STRL REUS W/ TWL XL LVL3 (GOWN DISPOSABLE) ×1 IMPLANT
GOWN STRL REUS W/TWL LRG LVL3 (GOWN DISPOSABLE) ×2 IMPLANT
GOWN STRL REUS W/TWL XL LVL3 (GOWN DISPOSABLE) ×2
HEMOSTAT SURGICEL 4X8 (HEMOSTASIS) ×2 IMPLANT
KIT ROOM TURNOVER AP CYSTO (KITS) ×2 IMPLANT
LIGASURE IMPACT 36 18CM CVD LR (INSTRUMENTS) ×1 IMPLANT
MANIFOLD NEPTUNE II (INSTRUMENTS) ×2 IMPLANT
NDL HYPO 25X1 1.5 SAFETY (NEEDLE) ×1 IMPLANT
NEEDLE HYPO 25X1 1.5 SAFETY (NEEDLE) ×4 IMPLANT
NS IRRIG 1000ML POUR BTL (IV SOLUTION) ×2 IMPLANT
PACK PERI GYN (CUSTOM PROCEDURE TRAY) ×2 IMPLANT
PAD ARMBOARD 7.5X6 YLW CONV (MISCELLANEOUS) ×2 IMPLANT
SET BASIN LINEN APH (SET/KITS/TRAYS/PACK) ×2 IMPLANT
SPONGE GAUZE 4X4 12PLY (GAUZE/BANDAGES/DRESSINGS) ×1 IMPLANT
SURGILUBE 3G PEEL PACK STRL (MISCELLANEOUS) ×2 IMPLANT
SUT SILK 0 FSL (SUTURE) ×2 IMPLANT
SUT VIC AB 2-0 CT2 27 (SUTURE) IMPLANT
SYRINGE 10CC LL (SYRINGE) ×1 IMPLANT

## 2015-06-20 NOTE — Discharge Instructions (Signed)
Surgical Procedures for Hemorrhoids, Care After °Refer to this sheet in the next few weeks. These instructions provide you with information about caring for yourself after your procedure. Your health care provider may also give you more specific instructions. Your treatment has been planned according to current medical practices, but problems sometimes occur. Call your health care provider if you have any problems or questions after your procedure. °WHAT TO EXPECT AFTER THE PROCEDURE °After your procedure, it is common to have: °· Rectal pain. °· Pain when you are having a bowel movement. °· Slight rectal bleeding. °HOME CARE INSTRUCTIONS °Medicines °· Take over-the-counter and prescription medicines only as told by your health care provider. °· Do not drive or operate heavy machinery while taking prescription pain medicine. °· Use a stool softener or a bulk laxative as told by your health care provider. °Activities °· Rest at home. Return to your normal activities as told by your health care provider. °· Do not lift anything that is heavier than 10 lb (4.5 kg). °· Do not sit for long periods of time. Take a walk every day or as told by your health care provider. °· Do not strain to have a bowel movement. Do not spend a long time sitting on the toilet. °Eating and Drinking °· Eat foods that contain fiber, such as whole grains, beans, nuts, fruits, and vegetables. °· Drink enough fluid to keep your urine clear or pale yellow. °General Instructions °· Sit in a warm bath 2-3 times per day to relieve soreness or itching. °· Keep all follow-up visits as told by your health care provider. This is important. °SEEK MEDICAL CARE IF: °· Your pain medicine is not helping. °· You have a fever or chills. °· You become constipated. °· You have trouble passing urine. °SEEK IMMEDIATE MEDICAL CARE IF: °· You have very bad rectal pain. °· You have heavy bleeding from your rectum. °  °This information is not intended to replace advice  given to you by your health care provider. Make sure you discuss any questions you have with your health care provider. °  °Document Released: 05/26/2003 Document Revised: 11/24/2014 Document Reviewed: 05/31/2014 °Elsevier Interactive Patient Education ©2016 [employer] ° °

## 2015-06-20 NOTE — Transfer of Care (Signed)
Immediate Anesthesia Transfer of Care Note  Patient: [name]  Procedure(s) Performed: Procedure(s): HEMORRHOIDECTOMY (N/A)  Patient Location: PACU  Anesthesia Type:General  Level of Consciousness: awake, oriented and patient cooperative  Airway & Oxygen Therapy: Patient Spontanous Breathing and Patient connected to face mask oxygen  Post-op Assessment: Report given to RN and Post -op Vital signs reviewed and stable  Post vital signs: Reviewed and stable  Last Vitals:  Filed Vitals:   06/20/15 0830 06/20/15 0832  BP: 137/82   Temp:  36.6 C  Resp: 21     Complications: No apparent anesthesia complications

## 2015-06-20 NOTE — Anesthesia Postprocedure Evaluation (Signed)
Anesthesia Post Note  Patient: [name]  Procedure(s) Performed: Procedure(s) (LRB): HEMORRHOIDECTOMY (N/A)  Patient location during evaluation: PACU Anesthesia Type: General Level of consciousness: awake and alert and oriented Pain management: pain level controlled Vital Signs Assessment: post-procedure vital signs reviewed and stable Respiratory status: spontaneous breathing and respiratory function stable Cardiovascular status: stable Postop Assessment: no signs of nausea or vomiting Anesthetic complications: no    Last Vitals:  Filed Vitals:   06/20/15 0918 06/20/15 0930  BP: 130/75   Pulse: 70 66  Temp:    Resp: 10 10    Last Pain: There were no vitals filed for this visit.               ADAMS, AMY A

## 2015-06-20 NOTE — Op Note (Signed)
Patient:  [name]  DOB:  [birth year]  MRN:  [id number]   Preop Diagnosis:  Prolapsing hemorrhoid  Postop Diagnosis:  Same  Procedure:  Simple hemorrhoidectomy  Surgeon:  Aviva Signs, M.D.  Anes:  Gen.  Indications:  Patient is a 75 year old white female who presents with a prolapsing hemorrhoid. The risks and benefits of the procedure including bleeding, infection, and recurrence of hemorrhoidal disease were fully explained to the patient, who gave informed consent.  Procedure note:  The patient was placed in the lithotomy position after general anesthesia was a Company secretary. The perineum was prepped and draped using the usual sterile technique with Betadine. Surgical site confirmation was performed.  On rectal examination, a prolapsing hemorrhoid was present at the 6:00 position with a small residual internal hemorrhoid present. This was excised in a collar-like fashion in continuity using the LigaSure. No other significant hemorrhoidal disease was noted. No fissure was present. 0.5% Sensorcaine was instilled into the surrounding peritoneum. Surgicel and Viscous Xylocaine rectal packing was then placed.  All tape and needle counts were correct the end of the procedure. Patient was awakened and transferred to PACU in stable condition.  Complications:  None  EBL:  Minimal  Specimen:  Hemorrhoid

## 2015-06-20 NOTE — Anesthesia Procedure Notes (Signed)
Procedure Name: LMA Insertion Date/Time: 06/20/2015 8:48 AM Performed by: Andree Elk, AMY A Pre-anesthesia Checklist: Patient identified, Timeout performed, Emergency Drugs available, Suction available and Patient being monitored Patient Re-evaluated:Patient Re-evaluated prior to inductionOxygen Delivery Method: Circle system utilized Preoxygenation: Pre-oxygenation with 100% oxygen Intubation Type: IV induction Ventilation: Mask ventilation without difficulty LMA: LMA inserted LMA Size: 4.0 Number of attempts: 1 Placement Confirmation: positive ETCO2 and breath sounds checked- equal and bilateral Tube secured with: Tape Dental Injury: Teeth and Oropharynx as per pre-operative assessment

## 2015-06-20 NOTE — Interval H&P Note (Signed)
History and Physical Interval Note:  06/20/2015 8:08 AM  [name]  has presented today for surgery, with the diagnosis of painful hemorrhoids  The various methods of treatment have been discussed with the patient and family. After consideration of risks, benefits and other options for treatment, the patient has consented to  Procedure(s): SIMPLE HEMORRHOIDECTOMY (N/A) as a surgical intervention .  The patient's history has been reviewed, patient examined, no change in status, stable for surgery.  I have reviewed the patient's chart and labs.  Questions were answered to the patient's satisfaction.     Aviva Signs A

## 2015-06-20 NOTE — Anesthesia Preprocedure Evaluation (Signed)
Anesthesia Evaluation  Patient identified by MRN, date of birth, ID band Patient awake    Reviewed: Allergy & Precautions, NPO status , Patient's Chart, lab work & pertinent test results  History of Anesthesia Complications (+) PONV and history of anesthetic complications  Airway Mallampati: II  TM Distance: >3 FB Neck ROM: Full    Dental  (+) Teeth Intact, Dental Advisory Given   Pulmonary neg pulmonary ROS,    breath sounds clear to auscultation       Cardiovascular hypertension, Pt. on medications and Pt. on home beta blockers + dysrhythmias (hx PAF)  Rhythm:Regular Rate:Normal     Neuro/Psych    GI/Hepatic GERD  Medicated and Controlled,  Endo/Other    Renal/GU      Musculoskeletal   Abdominal   Peds  Hematology   Anesthesia Other Findings   Reproductive/Obstetrics                             Anesthesia Physical Anesthesia Plan  ASA: III  Anesthesia Plan: General   Post-op Pain Management:    Induction: Intravenous  Airway Management Planned: LMA  Additional Equipment:   Intra-op Plan:   Post-operative Plan: Extubation in OR  Informed Consent: I have reviewed the patients History and Physical, chart, labs and discussed the procedure including the risks, benefits and alternatives for the proposed anesthesia with the patient or authorized representative who has indicated his/her understanding and acceptance.     Plan Discussed with:   Anesthesia Plan Comments:         Anesthesia Quick Evaluation

## 2015-06-22 ENCOUNTER — Encounter (HOSPITAL_COMMUNITY): Payer: Self-pay | Admitting: General Surgery

## 2015-06-23 DIAGNOSIS — Z1283 Encounter for screening for malignant neoplasm of skin: Secondary | ICD-10-CM | POA: Diagnosis not present

## 2015-06-23 DIAGNOSIS — Z08 Encounter for follow-up examination after completed treatment for malignant neoplasm: Secondary | ICD-10-CM | POA: Diagnosis not present

## 2015-06-23 DIAGNOSIS — Z85828 Personal history of other malignant neoplasm of skin: Secondary | ICD-10-CM | POA: Diagnosis not present

## 2015-07-11 MED FILL — METOPROLOL TARTRATE 25 MG T: 25 | 90 days supply | Qty: 180 | Fill #1 | Status: TO

## 2015-07-11 MED FILL — KLOR-CON M10 TABLET: 10 | 90 days supply | Qty: 180 | Fill #2 | Status: TO

## 2015-07-22 ENCOUNTER — Encounter (HOSPITAL_COMMUNITY): Payer: Self-pay | Admitting: Hematology & Oncology

## 2015-08-23 ENCOUNTER — Telehealth: Payer: Self-pay | Admitting: Radiation Oncology

## 2015-08-23 NOTE — Telephone Encounter (Signed)
Advised patient we needed to change appointment day, Dr Isidore Moos will be in the [facility] office, asked patient to please return call for new appointment day and time

## 2015-08-25 ENCOUNTER — Ambulatory Visit (INDEPENDENT_AMBULATORY_CARE_PROVIDER_SITE_OTHER): Payer: Medicare HMO | Admitting: Otolaryngology

## 2015-08-25 DIAGNOSIS — R69 Illness, unspecified: Secondary | ICD-10-CM | POA: Diagnosis not present

## 2015-08-25 DIAGNOSIS — C07 Malignant neoplasm of parotid gland: Secondary | ICD-10-CM | POA: Diagnosis not present

## 2015-08-25 NOTE — Progress Notes (Signed)
  [name] presents for follow up of radiation completed 02/09/16 to her Right Parotid bed.   Pain issues, if any: No Using a feeding tube?: No Weight changes, if any:  Wt Readings from Last 3 Encounters:  08/29/15 158 lb 9.6 oz (71.94 kg)  06/08/15 159 lb 4.8 oz (72.258 kg)  03/30/15 160 lb 14.4 oz (72.984 kg)   Swallowing issues, if any: No Smoking or chewing tobacco? No Using fluoride trays daily? No Last ENT visit was on: She saw Dr. Benjamine Mola 08/25/15 for 6 month follow up. She will return in December. There were no new problems noted.  Other notable issues, if any: She has no concerns at this time.  PET in March ordered by Dr. Whitney Muse

## 2015-08-26 ENCOUNTER — Ambulatory Visit: Admission: RE | Admit: 2015-08-26 | Payer: 59 | Source: Ambulatory Visit | Admitting: Radiation Oncology

## 2015-08-29 ENCOUNTER — Encounter: Payer: Self-pay | Admitting: Radiation Oncology

## 2015-08-29 ENCOUNTER — Ambulatory Visit
Admission: RE | Admit: 2015-08-29 | Discharge: 2015-08-29 | Disposition: A | Discharge status: Home / Self Care | Payer: Medicare HMO | Source: Ambulatory Visit | Attending: Radiation Oncology | Admitting: Radiation Oncology

## 2015-08-29 VITALS — BP 129/72 | HR 65 | Temp 97.7°F | Ht 62.0 in | Wt 158.6 lb

## 2015-08-29 DIAGNOSIS — C07 Malignant neoplasm of parotid gland: Secondary | ICD-10-CM | POA: Diagnosis not present

## 2015-08-29 NOTE — Progress Notes (Signed)
Radiation Oncology         ([phone]) [phone] ________________________________  Name: [name] MRN: [id number]  Date: 08/29/2015  DOB: [birth year]  Follow-Up Visit Note  CC: Mindy Lair, MD  [name] Kelby Fam, MD  Diagnosis and Prior Radiotherapy:       ICD-9-CM ICD-10-CM   1. 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  [name] presents for follow up of radiation completed 02/09/15 to her Right Parotid bed.   Pain issues, if any: No Using a feeding tube?: No Weight changes, if any:  none Swallowing issues, if any: No Smoking or chewing tobacco? No Denies palpable masses Last ENT visit was on: She saw Dr. Benjamine Cross 08/25/15 for 6 month follow up. She will return in December. There were no new problems noted.  Other notable issues, if any: She has no concerns at this time.  PET in March ordered by Dr. Whitney Cross showed no evidence of recurrent or progressive disease. 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  Meds: Current Outpatient Prescriptions  Medication Sig Dispense Refill  . aspirin EC 81 MG tablet Take 81 mg by mouth daily.    Marland Kitchen lisinopril-hydrochlorothiazide (PRINZIDE,ZESTORETIC) 10-12.5 MG per tablet Take 1 tablet by mouth daily. 90 tablet 1  . metoprolol tartrate (LOPRESSOR) 25 MG tablet Take 1 tablet (25 mg total) by mouth 2 (two) times daily. 180 tablet 1  . Multiple Vitamins-Minerals (HAIR/SKIN/NAILS PO) Take 1 tablet by mouth daily.    . Omega-3 Fatty Acids (OMEGA-3 2100 PO) Take 1 capsule by mouth at bedtime.    .  potassium chloride (K-DUR) 10 MEQ tablet Take 10 mEq by mouth daily.    . ranitidine (ZANTAC) 150 MG tablet Take 150 mg by mouth at bedtime.    Marland Kitchen zinc gluconate 50 MG tablet Take 50 mg by mouth daily.      Marland Kitchen HYDROcodone-acetaminophen (NORCO/VICODIN) 5-325 MG tablet Take 1 tablet by mouth every 6 (six) hours as needed for moderate pain. (Patient not taking: Reported on 08/29/2015) 40 tablet 0   No current facility-administered medications for this encounter.    Physical Findings: The patient is in no acute distress. Patient is alert and oriented. Wt Readings from Last 3 Encounters:  08/29/15 158 lb 9.6 oz (71.94 kg)  06/08/15 159 lb 4.8 oz (72.258 kg)  03/30/15 160 lb 14.4 oz (72.984 kg)    height is 5\' 2"  (1.575 m) and weight is 158 lb 9.6 oz (71.94 kg). Her temperature is 97.7 F (36.5 C). Her blood pressure is 129/72 and her pulse is 65. Her oxygen saturation is 100%. .  General: Alert and oriented, in no acute distress HEENT: Head is normocephalic.  Oropharynx is clear Neck: Neck is notable for no palpable masses in parotid, cervical, post auricular, nor SCV regions Skin: Skin in treatment fields shows satisfactory healing  Lymphatics: see Neck Exam Heart RRR Chest CTAB Psychiatric: Judgment and insight are intact. Affect is appropriate.   Lab Findings: Lab Results  Component  Value Date   WBC 6.1 06/16/2015   HGB 13.2 06/16/2015   HCT 38.9 06/16/2015   MCV 92.2 06/16/2015   PLT 159 06/16/2015    Lab Results  Component Value Date   TSH 0.345* 09/01/2010    Radiographic Findings: No results found.  Impression/Plan:    Head and Neck Cancer Status: NED  Follow-up in March 2018 with survivorship.  She will see Dr [name] and Dr. Whitney Cross at 3 mo intervals before that.  The patient was encouraged to call with any issues or questions before then.  I will defer to Mindy Craze, NP of survivorship for scheduling  the pt's subsequent appointment with me.   _____________________________________   Mindy Gibson, MD

## 2015-08-30 ENCOUNTER — Telehealth: Payer: Self-pay | Admitting: *Deleted

## 2015-08-30 ENCOUNTER — Other Ambulatory Visit: Payer: Self-pay | Admitting: Radiation Oncology

## 2015-08-30 DIAGNOSIS — C07 Malignant neoplasm of parotid gland: Secondary | ICD-10-CM

## 2015-08-30 NOTE — Telephone Encounter (Signed)
Called patient to inform of survivorship with [name] on 05-31-16 @ 1:30 pm, spoke with patient and she is aware of this appt.

## 2015-09-02 ENCOUNTER — Ambulatory Visit: Payer: 59 | Admitting: Radiation Oncology

## 2015-09-19 DIAGNOSIS — R69 Illness, unspecified: Secondary | ICD-10-CM | POA: Diagnosis not present

## 2015-09-26 DIAGNOSIS — H2513 Age-related nuclear cataract, bilateral: Secondary | ICD-10-CM | POA: Diagnosis not present

## 2015-09-26 DIAGNOSIS — I10 Essential (primary) hypertension: Secondary | ICD-10-CM | POA: Diagnosis not present

## 2015-09-26 DIAGNOSIS — H40013 Open angle with borderline findings, low risk, bilateral: Secondary | ICD-10-CM | POA: Diagnosis not present

## 2015-09-26 DIAGNOSIS — H35033 Hypertensive retinopathy, bilateral: Secondary | ICD-10-CM | POA: Diagnosis not present

## 2015-09-26 DIAGNOSIS — R0683 Snoring: Secondary | ICD-10-CM | POA: Diagnosis not present

## 2015-09-26 DIAGNOSIS — I872 Venous insufficiency (chronic) (peripheral): Secondary | ICD-10-CM | POA: Diagnosis not present

## 2015-09-26 DIAGNOSIS — H00015 Hordeolum externum left lower eyelid: Secondary | ICD-10-CM | POA: Diagnosis not present

## 2015-09-26 DIAGNOSIS — Z6828 Body mass index (BMI) 28.0-28.9, adult: Secondary | ICD-10-CM | POA: Diagnosis not present

## 2015-11-30 ENCOUNTER — Encounter (HOSPITAL_COMMUNITY): Payer: Medicare HMO | Attending: Hematology & Oncology | Admitting: Hematology & Oncology

## 2015-11-30 ENCOUNTER — Encounter (HOSPITAL_COMMUNITY): Payer: Self-pay | Admitting: Hematology & Oncology

## 2015-11-30 ENCOUNTER — Other Ambulatory Visit (HOSPITAL_COMMUNITY): Payer: Self-pay

## 2015-11-30 VITALS — BP 140/83 | HR 66 | Temp 97.8°F | Resp 16 | Wt 161.8 lb

## 2015-11-30 DIAGNOSIS — C07 Malignant neoplasm of parotid gland: Secondary | ICD-10-CM | POA: Insufficient documentation

## 2015-11-30 DIAGNOSIS — R911 Solitary pulmonary nodule: Secondary | ICD-10-CM

## 2015-11-30 NOTE — Progress Notes (Signed)
[place] [facility] [place] Note  Patient Care Team: Zella Richer. Scotty Court, MD as PCP - General (Unknown Physician Specialty) Rogene Houston, MD (Gastroenterology) Leta Baptist, MD as Consulting Physician (Otolaryngology)  CHIEF COMPLAINTS/PURPOSE OF CONSULTATION:  Adenocarcinoma of the Right Parotid Gland Final pathology of the parotid gland showing an adenocarcinoma, intermediate to focally high grade, nonspecific intercalated duct type, 1.8 cm in greatest dimension, no perineural nor angiolymphatic invasion margins are negative (second opinion at [facility]) pT1,pNX Right lateral parotidectomy with facial nerve dissection and preservation on 11/16/2014 with Dr. Benjamine Mola Partial sternotomy with excision of mediastinal mass and sternal plating by Dr. 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 female is here for follow-up of adenocarcinoma of the parotid.  [name] was here alone today.  She has been feeling well. She works out 3 times a week. She notes her energy is baseline.   At night when she turns over and lays on her right side, she experiences some tenderness in her right jaw area. She spoke to Dr. Benjamine Mola about this. She last saw him in June and will follow up with him in December. She denies any new pain. No difficulty with chewing. No new masses.   Her appetite is "too good".   She has recently returned from a trip to [place].   MEDICAL HISTORY:  Past Medical History:  Diagnosis Date  . 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 HTN (hypertension)   . Low TSH level 01/2010  . Neuromuscular disorder ([facility])   . Paroxysmal atrial fibrillation (HCC)    normal echiocardiogram and stress nuclear; low TSH with normal T3 and T4   . PONV (postoperative nausea and vomiting)     SURGICAL  HISTORY: Past Surgical History:  Procedure Laterality Date  . ABDOMINAL HYSTERECTOMY     w/o oophorectomy  . APPENDECTOMY    . BUNIONECTOMY     bilateral  . CHOLECYSTECTOMY    . colonoscopy  03/09/15  . COLONOSCOPY N/A 03/09/2015   Procedure: COLONOSCOPY;  Surgeon: Rogene Houston, MD;  Location: [facility];  Service: Endoscopy;  Laterality: N/A;  830  . ESOPHAGOGASTRODUODENOSCOPY  12/27/2011   Procedure: ESOPHAGOGASTRODUODENOSCOPY (EGD);  Surgeon: Rogene Houston, MD;  Location: [facility];  Service: Endoscopy;  Laterality: N/A;  250  . HEMORRHOID SURGERY N/A 06/20/2015   Procedure: HEMORRHOIDECTOMY;  Surgeon: Aviva Signs, MD;  Location: [facility] ORS;  Service: General;  Laterality: N/A;  . PAROTIDECTOMY Right 11/16/2014   Procedure: RIGHT PAROTIDECTOMY;  Surgeon: Leta Baptist, MD;  Location: [facility];  Service: ENT;  Laterality: Right;  . RESECTION OF MEDIASTINAL MASS N/A 10/01/2014   Procedure: RESECTION OF MEDIASTINAL MASS;  Surgeon: Grace Isaac, MD;  Location: [facility];  Service: Thoracic;  Laterality: N/A;  . STERNOTOMY N/A 10/01/2014   Procedure: STERNOTOMY;  Surgeon: Grace Isaac, MD;  Location: [place];  Service: Thoracic;  Laterality: N/A;  . TONSILLECTOMY    . TONSILLECTOMY    . VEIN LIGATION AND STRIPPING      SOCIAL HISTORY: Social History   Social History  . Marital status: Married    Spouse name: N/A  . Number of children: N/A  . Years of education: N/A   Occupational History  . Not on file.   Social  History Main Topics  . Smoking status: Never Smoker  . Smokeless tobacco: Never Used     Comment: does not smoke  . Alcohol use 0.0 oz/week     Comment: 1 glass of wine at a time just socially  . Drug use: No  . Sexual activity: Not on file   Other Topics Concern  . Not on file   Social History Narrative   Married, employment - Network engineer. No caffeine. 74 yr education   Married for 57 years. 1 child. No grandchildren.  She plays bridge, the  organ at church and enjoys traveling. She works at [employer] She is a non-smoker. Her husband used to smoke but quit in 1989. No ETOH, rare wine  FAMILY HISTORY: Family History  Problem Relation Age of Onset  . Heart disease Father   . Hypertension Father   . Diabetes Mother   . Hypertension Mother    indicated that her mother is deceased. She indicated that her father is deceased. She reported the following about her sister: Hx of cardiac disease and pulmonary embolism .   Mother died at 10 from dementia (Alzheimer's ?) Father died at 69 from complications of appendicitis 1 sister who is healthy, she is obese and has some "heart issues."  ALLERGIES:  is allergic to penicillins and sulfonamide derivatives.  MEDICATIONS:  Current Outpatient Prescriptions  Medication Sig Dispense Refill  . aspirin EC 81 MG tablet Take 81 mg by mouth daily.    Marland Kitchen lisinopril-hydrochlorothiazide (PRINZIDE,ZESTORETIC) 10-12.5 MG per tablet Take 1 tablet by mouth daily. 90 tablet 1  . metoprolol tartrate (LOPRESSOR) 25 MG tablet Take 1 tablet (25 mg total) by mouth 2 (two) times daily. 180 tablet 1  . Multiple Vitamins-Minerals (HAIR/SKIN/NAILS PO) Take 1 tablet by mouth daily.    . Omega-3 Fatty Acids (OMEGA-3 2100 PO) Take 1 capsule by mouth at bedtime.    . potassium chloride (K-DUR) 10 MEQ tablet Take 10 mEq by mouth daily.    . ranitidine (ZANTAC) 150 MG tablet Take 150 mg by mouth at bedtime.    Marland Kitchen zinc gluconate 50 MG tablet Take 50 mg by mouth daily.       No current facility-administered medications for this visit.     Review of Systems  Constitutional: Negative for fever, chills, weight loss and malaise/fatigue.  HENT: Negative for congestion, hearing loss, nosebleeds, sore throat and tinnitus.   Eyes: Negative for blurred vision, double vision, pain and discharge.  Respiratory: Negative for cough, hemoptysis, sputum production, shortness of breath and wheezing.   Cardiovascular: Negative for  palpitations, claudication, chest pain, leg swelling and PND.  Gastrointestinal: Negative for heartburn, nausea, vomiting, abdominal pain, diarrhea, constipation, blood in stool and melena.  Genitourinary: Negative for dysuria, urgency, frequency and hematuria.  Musculoskeletal: Positive for jaw pain. Negative for myalgias, joint pain and falls.  Tenderness of right jaw Skin: Negative for itching and rash.  Neurological: Negative for dizziness, tingling, tremors, sensory change, speech change, focal weakness, seizures, loss of consciousness, weakness and headaches.  Endo/Heme/Allergies: Does not bruise/bleed easily.  Psychiatric/Behavioral: Negative for depression, suicidal ideas, memory loss and substance abuse. The patient is not nervous/anxious and does not have insomnia.   14 point ROS was done and is otherwise as detailed above or in HPI   PHYSICAL EXAMINATION: ECOG PERFORMANCE STATUS: 0 - Asymptomatic  Vitals:   11/30/15 1258  BP: 140/83  Pulse: 66  Resp: 16  Temp: 97.8 F (36.6 C)   Filed Weights  11/30/15 1258  Weight: 161 lb 12.8 oz (73.4 kg)    Physical Exam  Constitutional: She is oriented to person, place, and time and well-developed, well-nourished, and in no distress.  HENT:  Head: Normocephalic and atraumatic.  Nose: Nose normal.  Mouth/Throat: Oropharynx is clear and moist. No oropharyngeal exudate.  R surgical incision site anterior to ear and down neck well healed.  Eyes: Conjunctivae and EOM are normal. Pupils are equal, round, and reactive to light. Right eye exhibits no discharge. Left eye exhibits no discharge. No scleral icterus.  Neck: Normal range of motion. Neck supple. No tracheal deviation present. No thyromegaly present.  Cardiovascular: Normal rate, regular rhythm and normal heart sounds.  Exam reveals no gallop and no friction rub.   No murmur heard. Pulmonary/Chest: Effort normal and breath sounds normal. She has no wheezes. She has no rales.   Sternotomy site well healed  Abdominal: Soft. Bowel sounds are normal. She exhibits no distension and no mass. There is no tenderness. There is no rebound and no guarding.  Musculoskeletal: Normal range of motion. She exhibits no edema.  Lymphadenopathy:    She has no cervical adenopathy.  Neurological: She is alert and oriented to person, place, and time. She has normal reflexes. No cranial nerve deficit. Gait normal. Coordination normal.  Skin: Skin is warm and dry. No rash noted.  Psychiatric: Mood, memory, affect and judgment normal.  Nursing note and vitals reviewed.   LABORATORY DATA:  I have reviewed the data as listed Lab Results  Component Value Date   WBC 6.1 06/16/2015   HGB 13.2 06/16/2015   HCT 38.9 06/16/2015   MCV 92.2 06/16/2015   PLT 159 06/16/2015     Chemistry      Component Value Date/Time   NA 139 06/16/2015 0920   K 3.6 06/16/2015 0920   CL 106 06/16/2015 0920   CO2 25 06/16/2015 0920   BUN 17 06/16/2015 0920   CREATININE 0.72 06/16/2015 0920      Component Value Date/Time   CALCIUM 8.9 06/16/2015 0920   ALKPHOS 47 10/03/2014 0420   AST 27 10/03/2014 0420   ALT 44 10/03/2014 0420   BILITOT 1.4 (H) 10/03/2014 0420       RADIOGRAPHIC STUDIES: I have personally reviewed the radiological images as listed and agreed with the findings in the report. Study Result     CLINICAL DATA: Subsequent treatment strategy for right parotid adenocarcinoma.  EXAM: NUCLEAR MEDICINE PET SKULL BASE TO THIGH  TECHNIQUE: 7.9 mCi F-18 FDG was injected intravenously. Full-ring PET imaging was performed from the skull base to thigh after the radiotracer. CT data was obtained and used for attenuation correction and anatomic localization.  FASTING BLOOD GLUCOSE: Value: 79 mg/dl  COMPARISON: 12/07/2014  FINDINGS: NECK  No hypermetabolic lymph nodes in the neck.  CHEST  There is a small nodule within the left upper lobe measuring 5 mm, image  number 17 of series 8. Unchanged from previous exam and too small to characterize by PET-CT. No hypermetabolic lymph nodes identified within the mediastinum or hilar regions.  ABDOMEN/PELVIS  No abnormal hypermetabolic activity within the liver, pancreas, adrenal glands, or spleen. No hypermetabolic lymph nodes in the abdomen or pelvis.  SKELETON  No focal hypermetabolic activity to suggest skeletal metastasis.  IMPRESSION: 1. No evidence for residual or recurrent hypermetabolic tumor. 2. The pulmonary nodule in the left upper lobe is unchanged measuring 5 mm and remains nonspecific.   Electronically Signed  By: Queen Slough.D.  On: 06/03/2015 10:23      ASSESSMENT & PLAN:  Adenocarcinoma of the Right Parotid Gland Final pathology of the parotid gland showing an adenocarcinoma, intermediate to focally high grade, nonspecific intercalated duct type, 1.8 cm in greatest dimension, no perineural nor angiolymphatic invasion margins are negative (second opinion at [facility]) pT1,pNX Right lateral parotidectomy with facial nerve dissection and preservation on 11/16/2014 with Dr. Benjamine Mola Partial sternotomy with excision of mediastinal mass and sternal plating by Dr. 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 Isidore Moos Hypokalemia/on potassium. Followed by Dr. Scotty Court LUL pulmonary nodule  Her last appointment with Dr. Benjamine Mola was in June and she will follow up with him in December.  The patient complains of right jaw tenderness. She will contact out office if this tenderness worsens.  For ongoing surveillance of her parotid gland adenocarcinoma and LUL pulmonary nodule CT imaging has been ordered. She will be apprised of the results once available.  She will return for follow up in 6 months, pending results of scans.  Orders Placed This Encounter   Procedures  . CT Chest W Contrast    Standing Status:   Future    Standing Expiration Date:   11/29/2016    Order Specific Question:   If indicated for the ordered procedure, I authorize the administration of contrast media per Radiology protocol    Answer:   Yes    Order Specific Question:   Reason for Exam (SYMPTOM  OR DIAGNOSIS REQUIRED)    Answer:   pulmonary nodule    Order Specific Question:   Preferred imaging location?    Answer:   [facility]  . CT SOFT TISSUE NECK W CONTRAST    Standing Status:   Future    Standing Expiration Date:   02/28/2017    Order Specific Question:   If indicated for the ordered procedure, I authorize the administration of contrast media per Radiology protocol    Answer:   Yes    Order Specific Question:   Reason for Exam (SYMPTOM  OR DIAGNOSIS REQUIRED)    Answer:   R parotid adenocarcinoma    Order Specific Question:   Preferred imaging location?    Answer:   [facility]     All questions were answered. The patient knows to call the clinic with any problems, questions or concerns.   This document serves as a record of services personally performed by Ancil Linsey, MD. It was created on her behalf by Arlyce Harman, a trained medical scribe. The creation of this record is based on the scribe's personal observations and the provider's statements to them. This document has been checked and approved by the attending provider.  I have reviewed the above documentation for accuracy and completeness, and I agree with the above.  This note was electronically signed.    Kelby Fam. Whitney Muse, MD

## 2015-11-30 NOTE — Patient Instructions (Addendum)
[place] [facility] Discharge Instructions  RECOMMENDATIONS MADE BY THE CONSULTANT AND ANY TEST RESULTS WILL BE SENT TO YOUR REFERRING PHYSICIAN.  You saw Dr. Whitney Muse today. CT will be scheduled for next week- will call with results. Follow up in 6 months.  Thank you for choosing [place] at [facility] to provide your oncology and hematology care.  To afford each patient quality time with our provider, please arrive at least 15 minutes before your scheduled appointment time.   Beginning January 23rd 2017 lab work for the [facility] will be done in the  Main lab at [facility] on 1st floor. If you have a lab appointment with the [facility] please come in thru the  Main Entrance and check in at the main information desk  You need to re-schedule your appointment should you arrive 10 or more minutes late.  We strive to give you quality time with our providers, and arriving late affects you and other patients whose appointments are after yours.  Also, if you no show three or more times for appointments you may be dismissed from the clinic at the providers discretion.     Again, thank you for choosing [facility].  Our hope is that these requests will decrease the amount of time that you wait before being seen by our physicians.       _____________________________________________________________  Should you have questions after your visit to [facility], please contact our office at [phone] between the hours of 8:30 a.m. and 4:30 p.m.  Voicemails left after 4:30 p.m. will not be returned until the following business day.  For prescription refill requests, have your pharmacy contact our office.         Resources For Cancer Patients and their Caregivers ? American Cancer Society: Can assist with transportation, wigs, general needs, runs Look Good Feel Better.        (863)399-7088 ? Cancer  Care: Provides financial assistance, online support groups, medication/co-pay assistance.  1-800-813-HOPE 440-669-6173) ? [facility] Assists [place] cancer patients and their families through emotional , educational and financial support.  873-166-6125 ? [place] DSS Where to apply for food stamps, Medicaid and utility assistance. (754) 358-0271 ? [facility]: Transportation to medical appointments. 401 540 2939 ? Social Security Administration: May apply for disability if have a Stage IV cancer. 602-316-2857 925 394 2935 ? [employer], Disability and Transit Services: Assists with nutrition, care and transit needs. [place] Support Programs: @10RELATIVEDAYS @ > Cancer Support Group  2nd Tuesday of the month 1pm-2pm, Journey Room  > Creative Journey  3rd Tuesday of the month 1130am-1pm, Journey Room  > Look Good Feel Better  1st Wednesday of the month 10am-12 noon, Journey Room (Call [place] to register [phone])

## 2015-12-14 ENCOUNTER — Encounter (HOSPITAL_COMMUNITY): Payer: Medicare HMO

## 2015-12-14 ENCOUNTER — Other Ambulatory Visit (HOSPITAL_COMMUNITY): Payer: Medicare HMO

## 2015-12-14 DIAGNOSIS — Z0100 Encounter for examination of eyes and vision without abnormal findings: Secondary | ICD-10-CM | POA: Diagnosis not present

## 2015-12-14 DIAGNOSIS — C07 Malignant neoplasm of parotid gland: Secondary | ICD-10-CM | POA: Diagnosis not present

## 2015-12-14 LAB — BUN: BUN: 18 mg/dL (ref 6–20)

## 2015-12-15 ENCOUNTER — Ambulatory Visit (HOSPITAL_COMMUNITY)
Admission: RE | Admit: 2015-12-15 | Discharge: 2015-12-15 | Disposition: A | Discharge status: Home / Self Care | Payer: Medicare HMO | Source: Ambulatory Visit | Attending: Hematology & Oncology | Admitting: Hematology & Oncology

## 2015-12-15 DIAGNOSIS — C089 Malignant neoplasm of major salivary gland, unspecified: Secondary | ICD-10-CM | POA: Diagnosis not present

## 2015-12-15 DIAGNOSIS — R918 Other nonspecific abnormal finding of lung field: Secondary | ICD-10-CM | POA: Diagnosis not present

## 2015-12-15 DIAGNOSIS — R911 Solitary pulmonary nodule: Secondary | ICD-10-CM | POA: Diagnosis not present

## 2015-12-15 DIAGNOSIS — C07 Malignant neoplasm of parotid gland: Secondary | ICD-10-CM

## 2015-12-15 DIAGNOSIS — I700 Atherosclerosis of aorta: Secondary | ICD-10-CM | POA: Diagnosis not present

## 2015-12-15 LAB — POCT I-STAT CREATININE: Creatinine, Ser: 0.9 mg/dL (ref 0.44–1.00)

## 2015-12-15 MED ORDER — IOPAMIDOL (ISOVUE-300) INJECTION 61%
100.0000 mL | Freq: Once | INTRAVENOUS | Status: AC | PRN
  Administered 2015-12-15: 100 mL via INTRAVENOUS

## 2015-12-20 ENCOUNTER — Telehealth (HOSPITAL_COMMUNITY): Payer: Self-pay | Admitting: *Deleted

## 2015-12-20 NOTE — Telephone Encounter (Signed)
-----   Message from Patrici Ranks, MD sent at 12/19/2015  9:04 PM EDT ----- Advise scans are good, two stable 4 mm pulmonary nodules unchanged, considered benign. May consider one additional repeat scan in future. Dr.P

## 2015-12-26 DIAGNOSIS — Z23 Encounter for immunization: Secondary | ICD-10-CM | POA: Diagnosis not present

## 2016-02-20 ENCOUNTER — Ambulatory Visit (INDEPENDENT_AMBULATORY_CARE_PROVIDER_SITE_OTHER): Payer: Medicare HMO | Admitting: Otolaryngology

## 2016-02-20 DIAGNOSIS — R0982 Postnasal drip: Secondary | ICD-10-CM | POA: Diagnosis not present

## 2016-02-20 DIAGNOSIS — J310 Chronic rhinitis: Secondary | ICD-10-CM | POA: Diagnosis not present

## 2016-02-20 DIAGNOSIS — Z85858 Personal history of malignant neoplasm of other endocrine glands: Secondary | ICD-10-CM | POA: Diagnosis not present

## 2016-02-27 ENCOUNTER — Other Ambulatory Visit (HOSPITAL_COMMUNITY): Payer: Self-pay | Admitting: Family Medicine

## 2016-02-27 DIAGNOSIS — Z1231 Encounter for screening mammogram for malignant neoplasm of breast: Secondary | ICD-10-CM

## 2016-03-07 ENCOUNTER — Ambulatory Visit (HOSPITAL_COMMUNITY)
Admission: RE | Admit: 2016-03-07 | Discharge: 2016-03-07 | Disposition: A | Discharge status: Home / Self Care | Payer: Medicare HMO | Source: Ambulatory Visit | Attending: Family Medicine | Admitting: Family Medicine

## 2016-03-07 DIAGNOSIS — Z1231 Encounter for screening mammogram for malignant neoplasm of breast: Secondary | ICD-10-CM | POA: Diagnosis not present

## 2016-05-30 NOTE — Progress Notes (Signed)
  [name] presents for follow up of radiation completed 02/09/15 to her Right Parotid Bed.   Pain issues, if any: She denies Using a feeding tube?: N/A Weight changes, if any:  Wt Readings from Last 3 Encounters:  06/01/16 168 lb (76.2 kg)  11/30/15 161 lb 12.8 oz (73.4 kg)  08/29/15 158 lb 9.6 oz (71.9 kg)   Swallowing issues, if any: She denies Smoking or chewing tobacco? No Using fluoride trays daily? N/A Last ENT visit was on: Dr Benjamine Mola 12/17 Other notable issues, if any:  She has an appointment at [facility] next week to see a new provider. She was originally scheduled to see Dr. Whitney Muse, but since Dr. Whitney Muse is no longer there she questions if she needs to see a new doctor or just rotate between Dr. Isidore Moos and Dr. Benjamine Mola.   BP 102/65   Pulse 72   Temp 97.7 F (36.5 C)   Ht 5\' 2"  (1.575 m)   Wt 168 lb (76.2 kg)   SpO2 96% Comment: room air  BMI 30.73 kg/m

## 2016-05-31 ENCOUNTER — Encounter: Payer: Medicare HMO | Admitting: Adult Health

## 2016-06-01 ENCOUNTER — Encounter: Payer: Self-pay | Admitting: Radiation Oncology

## 2016-06-01 ENCOUNTER — Ambulatory Visit
Admission: RE | Admit: 2016-06-01 | Discharge: 2016-06-01 | Disposition: A | Discharge status: Home / Self Care | Payer: Medicare HMO | Source: Ambulatory Visit | Attending: Radiation Oncology | Admitting: Radiation Oncology

## 2016-06-01 DIAGNOSIS — Z880 Allergy status to penicillin: Secondary | ICD-10-CM | POA: Diagnosis not present

## 2016-06-01 DIAGNOSIS — C07 Malignant neoplasm of parotid gland: Secondary | ICD-10-CM

## 2016-06-01 DIAGNOSIS — Z923 Personal history of irradiation: Secondary | ICD-10-CM | POA: Insufficient documentation

## 2016-06-01 NOTE — Progress Notes (Signed)
Radiation Oncology         [phone] ________________________________  Name: [name] MRN: [id number]  Date: 06/01/2016  DOB: 1940/10/04  Follow-Up Visit Note  CC: Deloria Lair, MD  Whitney Muse Kelby Fam, MD  Diagnosis and Prior Radiotherapy:       ICD-9-CM ICD-10-CM   1. Malignant tumor of parotid gland ([place]) 142.0 C07    pT1Nx  Stage I (clinical T1N0M0) adenocarcinoma, right parotid gland; int to high grade, no PNI or LVSI, notable for extremely close margins     12/30/2014-02/09/2015 : Right parotid bed treated to 60 Gy in 30 fractions  Narrative:  The patient returns today for routine follow-up of radiation completed 02/09/15 to her Right Parotid bed.   On review of systems, the patient denies any pain at this time. She denies any swallowing concerns. The patient is not using tobacco products at this time. Her last ENT visit with Dr. Benjamine Mola was in December. The patient retired last April.  The patient has an appointment with Dr. Talbert Cage at [facility] next week. She was originally scheduled to see Dr. Whitney Muse, who is no longer there.     She feels great, without complaint.  ALLERGIES:  is allergic to penicillins and sulfonamide derivatives.  Meds: Current Outpatient Prescriptions  Medication Sig Dispense Refill  . aspirin EC 81 MG tablet Take 81 mg by mouth daily.    Marland Kitchen lisinopril-hydrochlorothiazide (PRINZIDE,ZESTORETIC) 10-12.5 MG per tablet Take 1 tablet by mouth daily. 90 tablet 1  . metoprolol tartrate (LOPRESSOR) 25 MG tablet Take 1 tablet (25 mg total) by mouth 2 (two) times daily. 180 tablet 1  . Multiple Vitamins-Minerals (HAIR/SKIN/NAILS PO) Take 1 tablet by mouth daily.    . Omega-3 Fatty Acids (OMEGA-3 2100 PO) Take 1 capsule by mouth at bedtime.    . potassium chloride (K-DUR) 10 MEQ tablet Take 10 mEq by mouth daily.    . ranitidine (ZANTAC) 150 MG tablet Take 150 mg by mouth at bedtime.    Marland Kitchen zinc gluconate 50 MG tablet Take 50 mg by mouth daily.       No  current facility-administered medications for this encounter.     Physical Findings: The patient is in no acute distress. Patient is alert and oriented. Wt Readings from Last 3 Encounters:  06/01/16 168 lb (76.2 kg)  11/30/15 161 lb 12.8 oz (73.4 kg)  08/29/15 158 lb 9.6 oz (71.9 kg)    height is 5\' 2"  (1.575 m) and weight is 168 lb (76.2 kg). Her temperature is 97.7 F (36.5 C). Her blood pressure is 102/65 and her pulse is 72. Her oxygen saturation is 96%.  General: Alert and oriented, in no acute distress. HEENT: No lesions in the oral cavity or the oropharynx.  Neck: Some concavity of the tissue in right parotid bed where the patient previously had radiation and surgery. No palpable lymphadenopthy in the cervical or supraclavicular regions. Skin: Skin in treatment fields shows excellent healing. Scar on midline chest, well healed. Lymphatics: see Neck Exam Heart: Soft systolic murmur loudest in the aortic region. Chest: Clear to auscultation bilaterally. Psychiatric: Judgment and insight are intact. Affect is appropriate.  Lab Findings: Lab Results  Component Value Date   WBC 6.1 06/16/2015   HGB 13.2 06/16/2015   HCT 38.9 06/16/2015   MCV 92.2 06/16/2015   PLT 159 06/16/2015     Radiographic Findings: No results found.  Impression/Plan: No evidence of disease recurrence.  I advised the patient that she may  alternate appointment with Dr. Benjamine Mola and Dr. Talbert Cage if she would like to keep her care more local in [place].  I expect she will have an uneventful course from this point onward. I will be happy to see her back on an as needed basis. The patient is agreeable. The patient will continue to follow regularly with her PCP and dentist. I advised her to mention to her PCP that I heard a soft heart murmur today; this may or may not warrant workup.  The patient was encouraged to call with any issues or questions that may arise.  I spent 15 minutes face to face with the patient  and more than 50% of that time was spent in counseling and/or coordination of care.   _____________________________________   Eppie Gibson, MD  This document serves as a record of services personally performed by Eppie Gibson, MD. It was created on her behalf by Maryla Morrow, a trained medical scribe. The creation of this record is based on the scribe's personal observations and the provider's statements to them. This document has been checked and approved by the attending provider.

## 2016-06-07 ENCOUNTER — Ambulatory Visit (HOSPITAL_COMMUNITY): Payer: Medicare HMO

## 2016-07-03 ENCOUNTER — Encounter (HOSPITAL_COMMUNITY): Payer: Self-pay

## 2016-07-03 ENCOUNTER — Encounter (HOSPITAL_COMMUNITY): Payer: Medicare HMO | Attending: Oncology | Admitting: Oncology

## 2016-07-03 VITALS — BP 144/70 | HR 69 | Temp 98.1°F | Resp 18 | Wt 170.4 lb

## 2016-07-03 DIAGNOSIS — R911 Solitary pulmonary nodule: Secondary | ICD-10-CM | POA: Diagnosis not present

## 2016-07-03 DIAGNOSIS — C07 Malignant neoplasm of parotid gland: Secondary | ICD-10-CM | POA: Diagnosis not present

## 2016-07-03 NOTE — Patient Instructions (Signed)
[place] at [facility] Discharge Instructions  RECOMMENDATIONS MADE BY THE CONSULTANT AND ANY TEST RESULTS WILL BE SENT TO YOUR REFERRING PHYSICIAN.  You were seen today by Dr. Twana First Follow up in 6 months  See Amy up front for appointments   Thank you for choosing [place] at [facility] to provide your oncology and hematology care.  To afford each patient quality time with our provider, please arrive at least 15 minutes before your scheduled appointment time.    If you have a lab appointment with the [facility] please come in thru the  Main Entrance and check in at the main information desk  You need to re-schedule your appointment should you arrive 10 or more minutes late.  We strive to give you quality time with our providers, and arriving late affects you and other patients whose appointments are after yours.  Also, if you no show three or more times for appointments you may be dismissed from the clinic at the providers discretion.     Again, thank you for choosing [facility].  Our hope is that these requests will decrease the amount of time that you wait before being seen by our physicians.       _____________________________________________________________  Should you have questions after your visit to [facility], please contact our office at [phone] between the hours of 8:30 a.m. and 4:30 p.m.  Voicemails left after 4:30 p.m. will not be returned until the following business day.  For prescription refill requests, have your pharmacy contact our office.       Resources For Cancer Patients and their Caregivers ? American Cancer Society: Can assist with transportation, wigs, general needs, runs Look Good Feel Better.        913-665-1051 ? Cancer Care: Provides financial assistance, online support groups, medication/co-pay assistance.  1-800-813-HOPE (478) 885-1683) ? [place] Assists [place] cancer patients and their families through emotional , educational and financial support.  4144790078 ? [place] DSS Where to apply for food stamps, Medicaid and utility assistance. 503-696-4348 ? [facility]: Transportation to medical appointments. 415-466-3851 ? Social Security Administration: May apply for disability if have a Stage IV cancer. 870-874-9695 860-818-1172 ? [employer], Disability and Transit Services: Assists with nutrition, care and transit needs. [place] Support Programs: @10RELATIVEDAYS @ > Cancer Support Group  2nd Tuesday of the month 1pm-2pm, Journey Room  > Creative Journey  3rd Tuesday of the month 1130am-1pm, Journey Room  > Look Good Feel Better  1st Wednesday of the month 10am-12 noon, Journey Room (Call [place] to register [phone])

## 2016-07-03 NOTE — Progress Notes (Signed)
[place] at [place] Note  Patient Care Team: Zella Richer. Scotty Court, MD as PCP - General (Unknown Physician Specialty) Rogene Houston, MD (Gastroenterology) Leta Baptist, MD as Consulting Physician (Otolaryngology)  CHIEF COMPLAINTS/PURPOSE OF CONSULTATION:  Adenocarcinoma of the Right Parotid Gland Final pathology of the parotid gland showing an adenocarcinoma, intermediate to focally high grade, nonspecific intercalated duct type, 1.8 cm in greatest dimension, no perineural nor angiolymphatic invasion margins are negative (second opinion at [facility]) pT1,pNX Right lateral parotidectomy with facial nerve dissection and preservation on 11/16/2014 with Dr. Benjamine Mola Partial sternotomy with excision of mediastinal mass and sternal plating by Dr. Servando Snare on 10/01/2014 Final pathology of mediastinal mass benign thyroid tissue with no evidence of malignancy  HISTORY OF PRESENTING ILLNESS:  [name] 76 y.o. female is here for follow-up of adenocarcinoma of the parotid. I personally reviewed and went over scans with the patient.   [name] presents to the clinic for continuing follow up.   She states she has intermittent right sided nerve pain under her right ear and surrounding area where she had her parotidectomy. She also notes that she has right sided facial tenderness when she lays down at night. She reports this has been happening intermittently since the past 2 years.  She also reports intermittent mild dry mouth, but its not very noticeable for her. She denies chest pain sob, and abdominal pain. She has intermittent chest pain at the site where she had the sternotomy.  She states she has been trying to be active. She started playing "pickleball" recently.  She reports she is up to date on her colonoscopy.    MEDICAL HISTORY:  Past Medical History:  Diagnosis Date  . 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 HTN  (hypertension)   . Low TSH level 01/2010  . Neuromuscular disorder ([place])   . Paroxysmal atrial fibrillation (HCC)    normal echiocardiogram and stress nuclear; low TSH with normal T3 and T4   . PONV (postoperative nausea and vomiting)     SURGICAL HISTORY: Past Surgical History:  Procedure Laterality Date  . ABDOMINAL HYSTERECTOMY     w/o oophorectomy  . APPENDECTOMY    . BUNIONECTOMY     bilateral  . CHOLECYSTECTOMY    . colonoscopy  03/09/15  . COLONOSCOPY N/A 03/09/2015   Procedure: COLONOSCOPY;  Surgeon: Rogene Houston, MD;  [facility];  Service: Endoscopy;  Laterality: N/A;  830  . ESOPHAGOGASTRODUODENOSCOPY  12/27/2011   Procedure: ESOPHAGOGASTRODUODENOSCOPY (EGD);  Surgeon: Rogene Houston, MD;  [facility];  Service: Endoscopy;  Laterality: N/A;  250  . HEMORRHOID SURGERY N/A 06/20/2015   Procedure: HEMORRHOIDECTOMY;  Surgeon: Aviva Signs, MD;  Location: [facility];  Service: General;  Laterality: N/A;  . PAROTIDECTOMY Right 11/16/2014   Procedure: RIGHT PAROTIDECTOMY;  Surgeon: Leta Baptist, MD;  Location: [place];  Service: ENT;  Laterality: Right;  . RESECTION OF MEDIASTINAL MASS N/A 10/01/2014   Procedure: RESECTION OF MEDIASTINAL MASS;  Surgeon: Grace Isaac, MD;  Location: [place];  Service: Thoracic;  Laterality: N/A;  . STERNOTOMY N/A 10/01/2014   Procedure: STERNOTOMY;  Surgeon: Grace Isaac, MD;  Location: [place];  Service: Thoracic;  Laterality: N/A;  . TONSILLECTOMY    . TONSILLECTOMY    . VEIN LIGATION AND STRIPPING      SOCIAL HISTORY: Social History   Social History  .  Marital status: Married    Spouse name: N/A  . Number of children: N/A  . Years of education: N/A   Occupational History  . Not on file.   Social History Main Topics  . Smoking status: Never Smoker  . Smokeless tobacco: Never Used     Comment: does not smoke  . Alcohol use 0.0 oz/week     Comment: 1 glass of wine at a time just socially  .  Drug use: No  . Sexual activity: Not on file   Other Topics Concern  . Not on file   Social History Narrative   Married, employment - Network engineer. No caffeine. 76 yr education   Married for 57 years. 1 child. No grandchildren.  She plays bridge, the organ at church and enjoys traveling. She works at [employer] She is a non-smoker. Her husband used to smoke but quit in 1989. No ETOH, rare wine  FAMILY HISTORY: Family History  Problem Relation Age of Onset  . Heart disease Father   . Hypertension Father   . Diabetes Mother   . Hypertension Mother    indicated that her mother is deceased. She indicated that her father is deceased. She reported the following about her sister: Hx of cardiac disease and pulmonary embolism .   Mother died at 80 from dementia (Alzheimer's ?) Father died at 78 from complications of appendicitis 1 sister who is healthy, she is obese and has some "heart issues."  ALLERGIES:  is allergic to penicillins and sulfonamide derivatives.  MEDICATIONS:  Current Outpatient Prescriptions  Medication Sig Dispense Refill  . aspirin EC 81 MG tablet Take 81 mg by mouth daily.    Marland Kitchen lisinopril-hydrochlorothiazide (PRINZIDE,ZESTORETIC) 10-12.5 MG per tablet Take 1 tablet by mouth daily. 90 tablet 1  . metoprolol tartrate (LOPRESSOR) 25 MG tablet Take 1 tablet (25 mg total) by mouth 2 (two) times daily. 180 tablet 1  . Multiple Vitamins-Minerals (HAIR/SKIN/NAILS PO) Take 1 tablet by mouth daily.    . Omega-3 Fatty Acids (OMEGA-3 2100 PO) Take 1 capsule by mouth at bedtime.    . potassium chloride (K-DUR) 10 MEQ tablet Take 10 mEq by mouth daily.    . ranitidine (ZANTAC) 150 MG tablet Take 150 mg by mouth at bedtime.    Marland Kitchen zinc gluconate 50 MG tablet Take 50 mg by mouth daily.       No current facility-administered medications for this visit.     REVIEW OF SYSTEMS   Review of Systems  Constitutional: Negative.   HENT: Negative.        Right sided nerve pain under her right  ear. Right sided tenderness when laying at night. Intermittent dry mouth.  Eyes: Negative.   Respiratory: Negative.  Negative for shortness of breath.   Cardiovascular: Negative.  Negative for chest pain.       Mild chest tenderness.  Gastrointestinal: Negative.  Negative for abdominal pain.  Genitourinary: Negative.   Musculoskeletal: Negative.   Skin: Negative.   Neurological: Negative.        Right sided nerve pain under her right ear.  Endo/Heme/Allergies: Negative.   Psychiatric/Behavioral: Negative.   All other systems reviewed and are negative.   14 point ROS was done and is otherwise as detailed above or in HPI   PHYSICAL EXAMINATION: ECOG PERFORMANCE STATUS: 0 - Asymptomatic  Vitals:   07/03/16 1519  BP: (!) 144/70  Pulse: 69  Resp: 18  Temp: 98.1 F (36.7 C)     Physical Exam  Constitutional: She is oriented to person, place, and time and well-developed, well-nourished, and in no distress.  HENT:  Head: Normocephalic and atraumatic.  Eyes: Conjunctivae and EOM are normal. Pupils are equal, round, and reactive to light.  Neck: Normal range of motion. Neck supple.  Cardiovascular: Normal rate and regular rhythm.   Murmur (mild, very faint) heard. Pulmonary/Chest: Effort normal and breath sounds normal.  Abdominal: Soft. Bowel sounds are normal.  Musculoskeletal: Normal range of motion.  Neurological: She is alert and oriented to person, place, and time. Gait normal.  Skin: Skin is warm and dry.  Nursing note and vitals reviewed.     LABORATORY DATA:  I have reviewed the data as listed Lab Results  Component Value Date   WBC 6.1 06/16/2015   HGB 13.2 06/16/2015   HCT 38.9 06/16/2015   MCV 92.2 06/16/2015   PLT 159 06/16/2015     Chemistry      Component Value Date/Time   NA 139 06/16/2015 0920   K 3.6 06/16/2015 0920   CL 106 06/16/2015 0920   CO2 25 06/16/2015 0920   BUN 18 12/14/2015 0827   CREATININE 0.90 12/15/2015 0913      Component  Value Date/Time   CALCIUM 8.9 06/16/2015 0920   ALKPHOS 47 10/03/2014 0420   AST 27 10/03/2014 0420   ALT 44 10/03/2014 0420   BILITOT 1.4 (H) 10/03/2014 0420       RADIOGRAPHIC STUDIES: I have personally reviewed the radiological images as listed and agreed with the findings in the report. Study Result     CLINICAL DATA: Subsequent treatment strategy for right parotid adenocarcinoma.  EXAM: NUCLEAR MEDICINE PET SKULL BASE TO THIGH  TECHNIQUE: 7.9 mCi F-18 FDG was injected intravenously. Full-ring PET imaging was performed from the skull base to thigh after the radiotracer. CT data was obtained and used for attenuation correction and anatomic localization.  FASTING BLOOD GLUCOSE: Value: 79 mg/dl  COMPARISON: 12/07/2014  FINDINGS: NECK  No hypermetabolic lymph nodes in the neck.  CHEST  There is a small nodule within the left upper lobe measuring 5 mm, image number 17 of series 8. Unchanged from previous exam and too small to characterize by PET-CT. No hypermetabolic lymph nodes identified within the mediastinum or hilar regions.  ABDOMEN/PELVIS  No abnormal hypermetabolic activity within the liver, pancreas, adrenal glands, or spleen. No hypermetabolic lymph nodes in the abdomen or pelvis.  SKELETON  No focal hypermetabolic activity to suggest skeletal metastasis.  IMPRESSION: 1. No evidence for residual or recurrent hypermetabolic tumor. 2. The pulmonary nodule in the left upper lobe is unchanged measuring 5 mm and remains nonspecific.   Electronically Signed  By: Kerby Moors M.D.  On: 06/03/2015 10:23    2D DIGITAL SCREENING BILATERAL MAMMOGRAM WITH CAD AND ADJUNCT TOMO 03/07/2016  IMPRESSION: No mammographic evidence of malignancy. A result letter of this screening mammogram will be mailed directly to the patient.   ASSESSMENT & PLAN:  Adenocarcinoma of the Right Parotid Gland Final pathology of the parotid gland  showing an adenocarcinoma, intermediate to focally high grade, nonspecific intercalated duct type, 1.8 cm in greatest dimension, no perineural nor angiolymphatic invasion margins are negative (second opinion at [facility]) pT1,pNX Right lateral parotidectomy with facial nerve dissection and preservation on 11/16/2014 with Dr. Benjamine Mola Partial sternotomy with excision of mediastinal mass and sternal plating by Dr. Servando Snare on 10/01/2014 Final pathology of mediastinal mass benign thyroid tissue with no evidence of malignancy Adjuvant XRT to R malignant tumor of parotid  gland, 60Gy with Dr. Isidore Moos Hypokalemia/on potassium. Followed by Dr. Scotty Court LUL pulmonary nodule  I have reviewed her scans from 11/2015 in detail with the patient. She has no evidence of recurrence.  Continue follow up with Dr. Benjamine Mola, her next appt is in June. I have also reviewed her CT chest with the patient from 11/2015 as well for follow up of her 4 mm pulm nodules, these have shown stability and are thought to be benign by radiology. RTC in 6 months for follow up. She knows to call us earlier if her ear pain and nerve pain becomes constant and worsens.  All questions were answered. The patient knows to call the clinic with any problems, questions or concerns.   This document serves as a record of services personally performed by Twana First, MD. It was created on her behalf by Shirlean Mylar, a trained medical scribe. The creation of this record is based on the scribe's personal observations and the provider's statements to them. This document has been checked and approved by the attending provider.  I have reviewed the above documentation for accuracy and completeness, and I agree with the above.  This note was electronically signed.

## 2016-08-20 ENCOUNTER — Ambulatory Visit (INDEPENDENT_AMBULATORY_CARE_PROVIDER_SITE_OTHER): Payer: Medicare HMO | Admitting: Otolaryngology

## 2016-08-20 DIAGNOSIS — R490 Dysphonia: Secondary | ICD-10-CM

## 2016-08-20 DIAGNOSIS — K219 Gastro-esophageal reflux disease without esophagitis: Secondary | ICD-10-CM | POA: Diagnosis not present

## 2016-08-20 DIAGNOSIS — Z85858 Personal history of malignant neoplasm of other endocrine glands: Secondary | ICD-10-CM | POA: Diagnosis not present

## 2016-10-08 ENCOUNTER — Ambulatory Visit (INDEPENDENT_AMBULATORY_CARE_PROVIDER_SITE_OTHER): Payer: Medicare HMO | Admitting: Otolaryngology

## 2016-10-08 DIAGNOSIS — R490 Dysphonia: Secondary | ICD-10-CM | POA: Diagnosis not present

## 2016-10-08 DIAGNOSIS — K219 Gastro-esophageal reflux disease without esophagitis: Secondary | ICD-10-CM | POA: Diagnosis not present

## 2016-11-19 ENCOUNTER — Emergency Department (HOSPITAL_COMMUNITY): Payer: Medicare HMO

## 2016-11-19 ENCOUNTER — Encounter (HOSPITAL_COMMUNITY): Payer: Self-pay | Admitting: Emergency Medicine

## 2016-11-19 ENCOUNTER — Emergency Department (HOSPITAL_COMMUNITY)
Admission: EM | Admit: 2016-11-19 | Discharge: 2016-11-20 | Disposition: A | Discharge status: Home / Self Care | Payer: Medicare HMO | Attending: Emergency Medicine | Admitting: Emergency Medicine

## 2016-11-19 DIAGNOSIS — R0789 Other chest pain: Secondary | ICD-10-CM | POA: Diagnosis not present

## 2016-11-19 DIAGNOSIS — Z79899 Other long term (current) drug therapy: Secondary | ICD-10-CM | POA: Diagnosis not present

## 2016-11-19 DIAGNOSIS — I10 Essential (primary) hypertension: Secondary | ICD-10-CM | POA: Diagnosis not present

## 2016-11-19 DIAGNOSIS — R079 Chest pain, unspecified: Secondary | ICD-10-CM | POA: Diagnosis present

## 2016-11-19 DIAGNOSIS — Z7982 Long term (current) use of aspirin: Secondary | ICD-10-CM | POA: Diagnosis not present

## 2016-11-19 LAB — COMPREHENSIVE METABOLIC PANEL
ALT: 22 U/L (ref 14–54)
ANION GAP: 8 (ref 5–15)
AST: 23 U/L (ref 15–41)
Albumin: 3.8 g/dL (ref 3.5–5.0)
Alkaline Phosphatase: 63 U/L (ref 38–126)
BUN: 16 mg/dL (ref 6–20)
CHLORIDE: 102 mmol/L (ref 101–111)
CO2: 25 mmol/L (ref 22–32)
Calcium: 9.1 mg/dL (ref 8.9–10.3)
Creatinine, Ser: 0.87 mg/dL (ref 0.44–1.00)
GFR calc non Af Amer: >60 mL/min (ref >60)
Glucose, Bld: 116 mg/dL — ABNORMAL HIGH (ref 65–99)
Potassium: 3.3 mmol/L — ABNORMAL LOW (ref 3.5–5.1)
SODIUM: 135 mmol/L (ref 135–145)
Total Bilirubin: 0.6 mg/dL (ref 0.3–1.2)
Total Protein: 7.2 g/dL (ref 6.5–8.1)

## 2016-11-19 LAB — CBC WITH DIFFERENTIAL/PLATELET
Basophils Absolute: 0 10*3/uL (ref 0.0–0.1)
Basophils Relative: 1 %
EOS ABS: 0.1 10*3/uL (ref 0.0–0.7)
EOS PCT: 2 %
HCT: 36.3 % (ref 36.0–46.0)
Hemoglobin: 12.8 g/dL (ref 12.0–15.0)
LYMPHS ABS: 1.8 10*3/uL (ref 0.7–4.0)
Lymphocytes Relative: 34 %
MCH: 31.8 pg (ref 26.0–34.0)
MCHC: 35.3 g/dL (ref 30.0–36.0)
MCV: 90.1 fL (ref 78.0–100.0)
MONOS PCT: 11 %
Monocytes Absolute: 0.6 10*3/uL (ref 0.1–1.0)
Neutro Abs: 2.9 10*3/uL (ref 1.7–7.7)
Neutrophils Relative %: 52 %
PLATELETS: 178 10*3/uL (ref 150–400)
RBC: 4.03 MIL/uL (ref 3.87–5.11)
RDW: 12.7 % (ref 11.5–15.5)
WBC: 5.4 10*3/uL (ref 4.0–10.5)

## 2016-11-19 LAB — I-STAT TROPONIN, ED: TROPONIN I, POC: 0.01 ng/mL (ref 0.00–0.08)

## 2016-11-19 LAB — LIPASE, BLOOD: Lipase: 54 U/L — ABNORMAL HIGH (ref 11–51)

## 2016-11-19 MED ORDER — AMLODIPINE BESYLATE 5 MG PO TABS
5.0000 mg | ORAL_TABLET | Freq: Once | ORAL | Status: AC
  Administered 2016-11-19: 5 mg via ORAL
  Filled 2016-11-19: qty 1

## 2016-11-19 NOTE — ED Notes (Signed)
Pt returned from xray,  

## 2016-11-19 NOTE — ED Notes (Signed)
ED Provider at bedside. 

## 2016-11-19 NOTE — ED Provider Notes (Signed)
[place] DEPT Provider Note   CSN: [id number] Arrival date & time: 11/19/16  2228     History   Chief Complaint Chief Complaint  Patient presents with  . Chest Pain    HPI [name] is a 76 y.o. female.  Patient presents to the emergency department for evaluation of chest pain. Patient reports that she had an episode of tightness across the center of her chest earlier this morning that lasted for a few minutes and then resolved. She was well for the rest of the day, then tonight had a recurrence of symptoms. She reports that she has had multiple episodes of this pain this evening. He will hit her suddenly, like a tightening and spasm across the lower chest, lasts for a couple of minutes and then resolve. In between episodes she has no symptoms. She does not have associated shortness of breath. There is no nausea, vomiting, diaphoresis.      Past Medical History:  Diagnosis Date  . 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 HTN (hypertension)   . Low TSH level 01/2010  . Neuromuscular disorder ([place])   . Paroxysmal atrial fibrillation (HCC)    normal echiocardiogram and stress nuclear; low TSH with normal T3 and T4   . PONV (postoperative nausea and vomiting)     Patient Active Problem List   Diagnosis Date Noted  . Pulmonary nodule, left 03/15/2015  . Malignant tumor of parotid gland ([place]) 12/09/2014  . H/O superficial parotidectomy 11/16/2014  . 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 Irritable bowel syndrome 06/30/2012  . GERD (gastroesophageal reflux disease) 12/05/2011  . Lumbar spondylosis 07/09/2011  . 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 Disorders of sacrum 05/15/2011  . Paroxysmal atrial fibrillation (HCC)   . DJD (degenerative joint disease)   . Low TSH level 01/17/2010  . Hypertension 07/27/2009  . PLANTAR FACIITIS 07/22/2007    Past Surgical History:    Procedure Laterality Date  . ABDOMINAL HYSTERECTOMY     w/o oophorectomy  . APPENDECTOMY    . BUNIONECTOMY     bilateral  . CHOLECYSTECTOMY    . colonoscopy  03/09/15  . COLONOSCOPY N/A 03/09/2015   Procedure: COLONOSCOPY;  Surgeon: Rogene Houston, MD;  [facility];  Service: Endoscopy;  Laterality: N/A;  830  . ESOPHAGOGASTRODUODENOSCOPY  12/27/2011   Procedure: ESOPHAGOGASTRODUODENOSCOPY (EGD);  Surgeon: Rogene Houston, MD;  [facility];  Service: Endoscopy;  Laterality: N/A;  250  . HEMORRHOID SURGERY N/A 06/20/2015   Procedure: HEMORRHOIDECTOMY;  Surgeon: Aviva Signs, MD;  Location[facility];  Service: General;  Laterality: N/A;  . PAROTIDECTOMY Right 11/16/2014   Procedure: RIGHT PAROTIDECTOMY;  Surgeon: Leta Baptist, MD;  Location: [place];  Service: ENT;  Laterality: Right;  . RESECTION OF MEDIASTINAL MASS N/A 10/01/2014   Procedure: RESECTION OF MEDIASTINAL MASS;  Surgeon: Grace Isaac, MD;  Location: [place];  Service: Thoracic;  Laterality: N/A;  . STERNOTOMY N/A 10/01/2014   Procedure: STERNOTOMY;  Surgeon: Grace Isaac, MD;  Location: [place];  Service: Thoracic;  Laterality: N/A;  . TONSILLECTOMY    . TONSILLECTOMY    . VEIN LIGATION AND STRIPPING      OB History    No data available       Home Medications    Prior to Admission medications   Medication Sig Start Date End Date Taking? Authorizing Provider  aspirin EC 81 MG tablet Take 81 mg by mouth daily.   Yes [provider]  ipratropium (ATROVENT) 0.06 % nasal spray Place 2 sprays into both nostrils 2 (two) times daily.   Yes [provider]  lisinopril-hydrochlorothiazide (PRINZIDE,ZESTORETIC) 10-12.5 MG per tablet Take 1 tablet by mouth daily. 01/05/14  Yes Lendon Colonel, NP  metoprolol tartrate (LOPRESSOR) 25 MG tablet Take 1 tablet (25 mg total) by mouth 2 (two) times daily. 01/05/14  Yes Lendon Colonel, NP  Multiple Vitamins-Minerals  (HAIR/SKIN/NAILS PO) Take 1 tablet by mouth every morning.    Yes [provider]  Omega-3 Fatty Acids (OMEGA-3 2100 PO) Take 1 capsule by mouth every morning.    Yes [provider]  omeprazole (PRILOSEC) 20 MG capsule Take 20 mg by mouth every morning. 11/13/16  Yes [provider]  potassium chloride (K-DUR) 10 MEQ tablet Take 10 mEq by mouth daily.   Yes [provider]  ranitidine (ZANTAC) 150 MG tablet Take 150 mg by mouth at bedtime.   Yes [provider]  zinc gluconate 50 MG tablet Take 50 mg by mouth daily.     Yes [provider]    Family History Family History  Problem Relation Age of Onset  . Heart disease Father   . Hypertension Father   . Diabetes Mother   . Hypertension Mother     Social History Social History  Substance Use Topics  . Smoking status: Never Smoker  . Smokeless tobacco: Never Used     Comment: does not smoke  . Alcohol use 0.0 oz/week     Comment: 1 glass of wine at a time just socially     Allergies   Penicillins and Sulfonamide derivatives   Review of Systems Review of Systems  Cardiovascular: Positive for chest pain.  All other systems reviewed and are negative.    Physical Exam Updated Vital Signs BP (!) 146/74   Pulse 64   Temp 97.9 F (36.6 C) (Oral)   Resp 11   Ht 5\' 2"  (1.575 m)   Wt 74.8 kg (165 lb)   SpO2 98%   BMI 30.18 kg/m   Physical Exam  Constitutional: She is oriented to person, place, and time. She appears well-developed and well-nourished. No distress.  HENT:  Head: Normocephalic and atraumatic.  Right Ear: Hearing normal.  Left Ear: Hearing normal.  Nose: Nose normal.  Mouth/Throat: Oropharynx is clear and moist and mucous membranes are normal.  Eyes: Pupils are equal, round, and reactive to light. Conjunctivae and EOM are normal.  Neck: Normal range of motion. Neck supple.  Cardiovascular: Regular rhythm, S1 normal and S2 normal.  Exam reveals no  gallop and no friction rub.   No murmur heard. Pulmonary/Chest: Effort normal and breath sounds normal. No respiratory distress. She exhibits no tenderness.  Abdominal: Soft. Normal appearance and bowel sounds are normal. There is no hepatosplenomegaly. There is no tenderness. There is no rebound, no guarding, no tenderness at McBurney's point and negative Murphy's sign. No hernia.  Musculoskeletal: Normal range of motion.  Neurological: She is alert and oriented to person, place, and time. She has normal strength. No cranial nerve deficit or sensory deficit. Coordination normal. GCS eye subscore is 4. GCS verbal subscore is 5. GCS motor subscore is 6.  Skin: Skin is warm, dry and intact. No rash noted. No cyanosis.  Psychiatric: She has a normal mood and affect. Her speech is normal and behavior is normal. Thought content  normal.  Nursing note and vitals reviewed.    ED Treatments / Results  Labs (all labs ordered are listed, but only abnormal results are displayed) Labs Reviewed  COMPREHENSIVE METABOLIC PANEL - Abnormal; Notable for the following:       Result Value   Potassium 3.3 (*)    Glucose, Bld 116 (*)    All other components within normal limits  LIPASE, BLOOD - Abnormal; Notable for the following:    Lipase 54 (*)    All other components within normal limits  CBC WITH DIFFERENTIAL/PLATELET  I-STAT TROPONIN, ED  I-STAT TROPONIN, ED    EKG  EKG Interpretation  Date/Time:  Monday November 19 2016 22:35:17 EDT Ventricular Rate:  72 PR Interval:    QRS Duration: 118 QT Interval:  388 QTC Calculation: 425 R Axis:   -51 Text Interpretation:  Sinus rhythm Borderline prolonged PR interval Incomplete left bundle branch block Left ventricular hypertrophy Anterior Q waves, possibly due to LVH Baseline wander in lead(s) V3 No significant change since last tracing Confirmed by Orpah Greek [phone]) on 11/19/2016 11:17:02 PM       Radiology Dg Chest 2 View  Result  Date: 11/20/2016 CLINICAL DATA:  Mid chest pain EXAM: CHEST  2 VIEW COMPARISON:  12/15/2015, 01/20/2015 FINDINGS: Stable postsurgical changes of the sternum. No acute consolidation or pleural effusion. Stable cardiomediastinal silhouette. No pneumothorax. Surgical clips in the right upper quadrant. IMPRESSION: No active cardiopulmonary disease. Electronically Signed   By: Donavan Foil M.D.   On: 11/20/2016 00:02    Procedures Procedures (including critical care time)  Medications Ordered in ED Medications  amLODipine (NORVASC) tablet 5 mg (5 mg Oral Given 11/19/16 2338)     Initial Impression / Assessment and Plan / ED Course  I have reviewed the triage vital signs and the nursing notes.  Pertinent labs & imaging results that were available during my care of the patient were reviewed by me and considered in my medical decision making (see chart for details).    Patient presented to the ER for evaluation of chest pain. She had a brief episode of pain earlier today. This evening she started having recurrent episodes. Symptoms are atypical. They occur sporadically and suddenly, last for a couple of minutes and then resolved. It seems to be somewhat spasmodic in the lower chest area.  Patient does not have any known coronary artery disease. He does have cardiac risk factors, however. EKG did not suggest ischemia or infarct at arrival. As herpresentation is atypical, it was felt that she did not require hospitalization for rule out, but rather 2 troponins in the ER with suffice. These were performed and are both negative.it is felt that the patient can be safely discharged with prompt follow-up with primary care, return if her symptoms worsen.   Final Clinical Impressions(s) / ED Diagnoses   Final diagnoses:  Atypical chest pain    New Prescriptions New Prescriptions   No medications on file     Orpah Greek, MD 11/20/16 [phone]

## 2016-11-19 NOTE — ED Triage Notes (Signed)
Pt states that chest pain began this morning and has been intermittent throughout the day.  Pt states that pain worsened tonight, she denies fever, nausea, vomiting, diarrhea.  Pt states pain is mid chest in the epigastric region and is sharp.

## 2016-11-20 DIAGNOSIS — R0789 Other chest pain: Secondary | ICD-10-CM | POA: Diagnosis not present

## 2016-11-20 LAB — I-STAT TROPONIN, ED: Troponin i, poc: 0.01 ng/mL (ref 0.00–0.08)

## 2016-11-20 NOTE — ED Notes (Signed)
20 gauge iv removed from left ac, pt tolerated well, site wnl,

## 2016-11-20 NOTE — ED Notes (Signed)
ED Provider at bedside. 

## 2016-12-03 ENCOUNTER — Ambulatory Visit (INDEPENDENT_AMBULATORY_CARE_PROVIDER_SITE_OTHER): Payer: Medicare HMO | Admitting: Otolaryngology

## 2016-12-03 DIAGNOSIS — R490 Dysphonia: Secondary | ICD-10-CM

## 2016-12-03 DIAGNOSIS — K219 Gastro-esophageal reflux disease without esophagitis: Secondary | ICD-10-CM | POA: Diagnosis not present

## 2017-01-02 ENCOUNTER — Encounter (HOSPITAL_COMMUNITY): Payer: Medicare HMO | Attending: Oncology | Admitting: Oncology

## 2017-01-02 VITALS — BP 150/75 | HR 77 | Resp 16 | Ht 62.0 in | Wt 168.0 lb

## 2017-01-02 DIAGNOSIS — C07 Malignant neoplasm of parotid gland: Secondary | ICD-10-CM

## 2017-01-02 DIAGNOSIS — Z923 Personal history of irradiation: Secondary | ICD-10-CM

## 2017-01-02 DIAGNOSIS — Z8509 Personal history of malignant neoplasm of other digestive organs: Secondary | ICD-10-CM | POA: Diagnosis not present

## 2017-01-02 NOTE — Progress Notes (Signed)
[facility] [place] Note  Patient Care Team: Deloria Lair., MD as PCP - General (Unknown Physician Specialty) Rogene Houston, MD (Gastroenterology) Leta Baptist, MD as Consulting Physician (Otolaryngology)  CHIEF COMPLAINTS/PURPOSE OF CONSULTATION:  Adenocarcinoma of the Right Parotid Gland Final pathology of the parotid gland showing an adenocarcinoma, intermediate to focally high grade, nonspecific intercalated duct type, 1.8 cm in greatest dimension, no perineural nor angiolymphatic invasion margins are negative (second opinion at [facility]) pT1,pNX Right lateral parotidectomy with facial nerve dissection and preservation on 11/16/2014 with Dr. Benjamine Mola Partial sternotomy with excision of mediastinal mass and sternal plating by Dr. Servando Snare on 10/01/2014 Final pathology of mediastinal mass benign thyroid tissue with no evidence of malignancy  HISTORY OF PRESENTING ILLNESS:  [name] 76 y.o. female is here for follow-up of adenocarcinoma of the parotid. I personally reviewed and went over scans with the patient.   [name] presents to the clinic for continuing follow up. She states she went to [facility] ED on 11/19/16 for substernal chest pain. She had negative troponins and EKG and was discharged home.    She continues to have soreness on the right side of her face surrounding the area where she had her parotidectomy. She went to see Dr. Benjamine Mola last month and was told that the soreness is likely due to her nerves trying to reconnect. She has another appointment with him in December for evaluation of hoarseness.    MEDICAL HISTORY:  Past Medical History:  Diagnosis Date  . 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 HTN (hypertension)   . Low TSH level 01/2010  . Neuromuscular disorder ([place])   . Paroxysmal atrial fibrillation (HCC)    normal echiocardiogram and stress nuclear; low TSH with normal T3 and T4    . PONV (postoperative nausea and vomiting)     SURGICAL HISTORY: Past Surgical History:  Procedure Laterality Date  . ABDOMINAL HYSTERECTOMY     w/o oophorectomy  . APPENDECTOMY    . BUNIONECTOMY     bilateral  . CHOLECYSTECTOMY    . colonoscopy  03/09/15  . COLONOSCOPY N/A 03/09/2015   Procedure: COLONOSCOPY;  Surgeon: Rogene Houston, MD;  [facility];  Service: Endoscopy;  Laterality: N/A;  830  . ESOPHAGOGASTRODUODENOSCOPY  12/27/2011   Procedure: ESOPHAGOGASTRODUODENOSCOPY (EGD);  Surgeon: Rogene Houston, MD;  [facility];  Service: Endoscopy;  Laterality: N/A;  250  . HEMORRHOID SURGERY N/A 06/20/2015   Procedure: HEMORRHOIDECTOMY;  Surgeon: Aviva Signs, MD;  [facility];  Service: General;  Laterality: N/A;  . PAROTIDECTOMY Right 11/16/2014   Procedure: RIGHT PAROTIDECTOMY;  Surgeon: Leta Baptist, MD;  Location: [place];  Service: ENT;  Laterality: Right;  . RESECTION OF MEDIASTINAL MASS N/A 10/01/2014   Procedure: RESECTION OF MEDIASTINAL MASS;  Surgeon: Grace Isaac, MD;  Location: [place];  Service: Thoracic;  Laterality: N/A;  . STERNOTOMY N/A 10/01/2014   Procedure: STERNOTOMY;  Surgeon: Grace Isaac, MD;  Location: [place];  Service: Thoracic;  Laterality: N/A;  . TONSILLECTOMY    . TONSILLECTOMY    . VEIN LIGATION AND STRIPPING      SOCIAL HISTORY: Social History   Social History  . Marital status: Married    Spouse name: N/A  . Number of children: N/A  . Years of education: N/A   Occupational History  . Not on file.  Social History Main Topics  . Smoking status: Never Smoker  . Smokeless tobacco: Never Used     Comment: does not smoke  . Alcohol use 0.0 oz/week     Comment: 1 glass of wine at a time just socially  . Drug use: No  . Sexual activity: Not on file   Other Topics Concern  . Not on file   Social History Narrative   Married, employment - Network engineer. No caffeine. 7 yr education   Married  for 57 years. 1 child. No grandchildren.  She plays bridge, the organ at church and enjoys traveling. She works at [employer] She is a non-smoker. Her husband used to smoke but quit in 1989. No ETOH, rare wine  FAMILY HISTORY: Family History  Problem Relation Age of Onset  . Heart disease Father   . Hypertension Father   . Diabetes Mother   . Hypertension Mother    indicated that her mother is deceased. She indicated that her father is deceased. She reported the following about her sister: Hx of cardiac disease and pulmonary embolism .   Mother died at 70 from dementia (Alzheimer's ?) Father died at 47 from complications of appendicitis 1 sister who is healthy, she is obese and has some "heart issues."  ALLERGIES:  is allergic to penicillins and sulfonamide derivatives.  MEDICATIONS:  Current Outpatient Prescriptions  Medication Sig Dispense Refill  . aspirin EC 81 MG tablet Take 81 mg by mouth daily.    Marland Kitchen ipratropium (ATROVENT) 0.06 % nasal spray Place 2 sprays into both nostrils 2 (two) times daily.    Marland Kitchen lisinopril-hydrochlorothiazide (PRINZIDE,ZESTORETIC) 10-12.5 MG per tablet Take 1 tablet by mouth daily. 90 tablet 1  . metoprolol tartrate (LOPRESSOR) 25 MG tablet Take 1 tablet (25 mg total) by mouth 2 (two) times daily. 180 tablet 1  . Multiple Vitamins-Minerals (HAIR/SKIN/NAILS PO) Take 1 tablet by mouth every morning.     . Omega-3 Fatty Acids (OMEGA-3 2100 PO) Take 1 capsule by mouth every morning.     Marland Kitchen omeprazole (PRILOSEC) 20 MG capsule Take 20 mg by mouth every morning.  3  . potassium chloride (K-DUR) 10 MEQ tablet Take 10 mEq by mouth daily.    . ranitidine (ZANTAC) 150 MG tablet Take 150 mg by mouth at bedtime.    Marland Kitchen zinc gluconate 50 MG tablet Take 50 mg by mouth daily.       No current facility-administered medications for this visit.     REVIEW OF SYSTEMS   Review of Systems  Constitutional: Negative.   HENT: Negative.        Right sided soreness in the area where  she had her parotid surgery.  Eyes: Negative.   Respiratory: Negative.  Negative for shortness of breath.   Cardiovascular: Negative.  Negative for chest pain.  Gastrointestinal: Negative.  Negative for abdominal pain.  Genitourinary: Negative.   Musculoskeletal: Negative.   Skin: Negative.   Neurological: Negative.   Endo/Heme/Allergies: Negative.   Psychiatric/Behavioral: Negative.   All other systems reviewed and are negative.   14 point ROS was done and is otherwise as detailed above or in HPI   PHYSICAL EXAMINATION: ECOG PERFORMANCE STATUS: 0 - Asymptomatic  Vitals:   01/02/17 1533  BP: (!) 150/75  Pulse: 77  Resp: 16  SpO2: 99%     Physical Exam  Constitutional: She is oriented to person, place, and time and well-developed, well-nourished, and in no distress.  HENT:  Head: Normocephalic and atraumatic.  Eyes:  Pupils are equal, round, and reactive to light. Conjunctivae and EOM are normal.  Neck: Normal range of motion. Neck supple.  Cardiovascular: Normal rate and regular rhythm.   Murmur (mild, very faint) heard. Pulmonary/Chest: Effort normal and breath sounds normal.  Abdominal: Soft. Bowel sounds are normal.  Musculoskeletal: Normal range of motion.  Neurological: She is alert and oriented to person, place, and time. Gait normal.  Skin: Skin is warm and dry.  Nursing note and vitals reviewed.     LABORATORY DATA:  I have reviewed the data as listed Lab Results  Component Value Date   WBC 5.4 11/19/2016   HGB 12.8 11/19/2016   HCT 36.3 11/19/2016   MCV 90.1 11/19/2016   PLT 178 11/19/2016     Chemistry      Component Value Date/Time   NA 135 11/19/2016 2319   K 3.3 (L) 11/19/2016 2319   CL 102 11/19/2016 2319   CO2 25 11/19/2016 2319   BUN 16 11/19/2016 2319   CREATININE 0.87 11/19/2016 2319      Component Value Date/Time   CALCIUM 9.1 11/19/2016 2319   ALKPHOS 63 11/19/2016 2319   AST 23 11/19/2016 2319   ALT 22 11/19/2016 2319    BILITOT 0.6 11/19/2016 2319       RADIOGRAPHIC STUDIES: I have personally reviewed the radiological images as listed and agreed with the findings in the report. Study Result     CLINICAL DATA: Subsequent treatment strategy for right parotid adenocarcinoma.  EXAM: NUCLEAR MEDICINE PET SKULL BASE TO THIGH  TECHNIQUE: 7.9 mCi F-18 FDG was injected intravenously. Full-ring PET imaging was performed from the skull base to thigh after the radiotracer. CT data was obtained and used for attenuation correction and anatomic localization.  FASTING BLOOD GLUCOSE: Value: 79 mg/dl  COMPARISON: 12/07/2014  FINDINGS: NECK  No hypermetabolic lymph nodes in the neck.  CHEST  There is a small nodule within the left upper lobe measuring 5 mm, image number 17 of series 8. Unchanged from previous exam and too small to characterize by PET-CT. No hypermetabolic lymph nodes identified within the mediastinum or hilar regions.  ABDOMEN/PELVIS  No abnormal hypermetabolic activity within the liver, pancreas, adrenal glands, or spleen. No hypermetabolic lymph nodes in the abdomen or pelvis.  SKELETON  No focal hypermetabolic activity to suggest skeletal metastasis.  IMPRESSION: 1. No evidence for residual or recurrent hypermetabolic tumor. 2. The pulmonary nodule in the left upper lobe is unchanged measuring 5 mm and remains nonspecific.   Electronically Signed  By: Kerby Moors M.D.  On: 06/03/2015 10:23    2D DIGITAL SCREENING BILATERAL MAMMOGRAM WITH CAD AND ADJUNCT TOMO 03/07/2016  IMPRESSION: No mammographic evidence of malignancy. A result letter of this screening mammogram will be mailed directly to the patient.   ASSESSMENT & PLAN:  Adenocarcinoma of the Right Parotid Gland Final pathology of the parotid gland showing an adenocarcinoma, intermediate to focally high grade, nonspecific intercalated duct type, 1.8 cm in greatest dimension, no  perineural nor angiolymphatic invasion margins are negative (second opinion at [facility]) pT1,pNX Right lateral parotidectomy with facial nerve dissection and preservation on 11/16/2014 with Dr. Benjamine Mola Partial sternotomy with excision of mediastinal mass and sternal plating by Dr. 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 Isidore Moos LUL pulmonary nodule benign  Clinically NED. Continue follow up with Dr. Benjamine Mola, her next appt is in December.  RTC in 6 months for follow up.   All questions were answered. The patient knows to call the clinic with any problems, questions or concerns.   This note was electronically signed.  Twana First, MD

## 2017-01-27 IMAGING — CR DG CHEST 2V
2 series · 2 of 2 positions shown · non-contrast
Comparison: PA and lateral chest x-ray dated October 19, 2014.

CLINICAL DATA: Substernal goiter, history of hypertension, atrial
fibrillation.

EXAM:
CHEST  2 VIEW

[w chest pa]
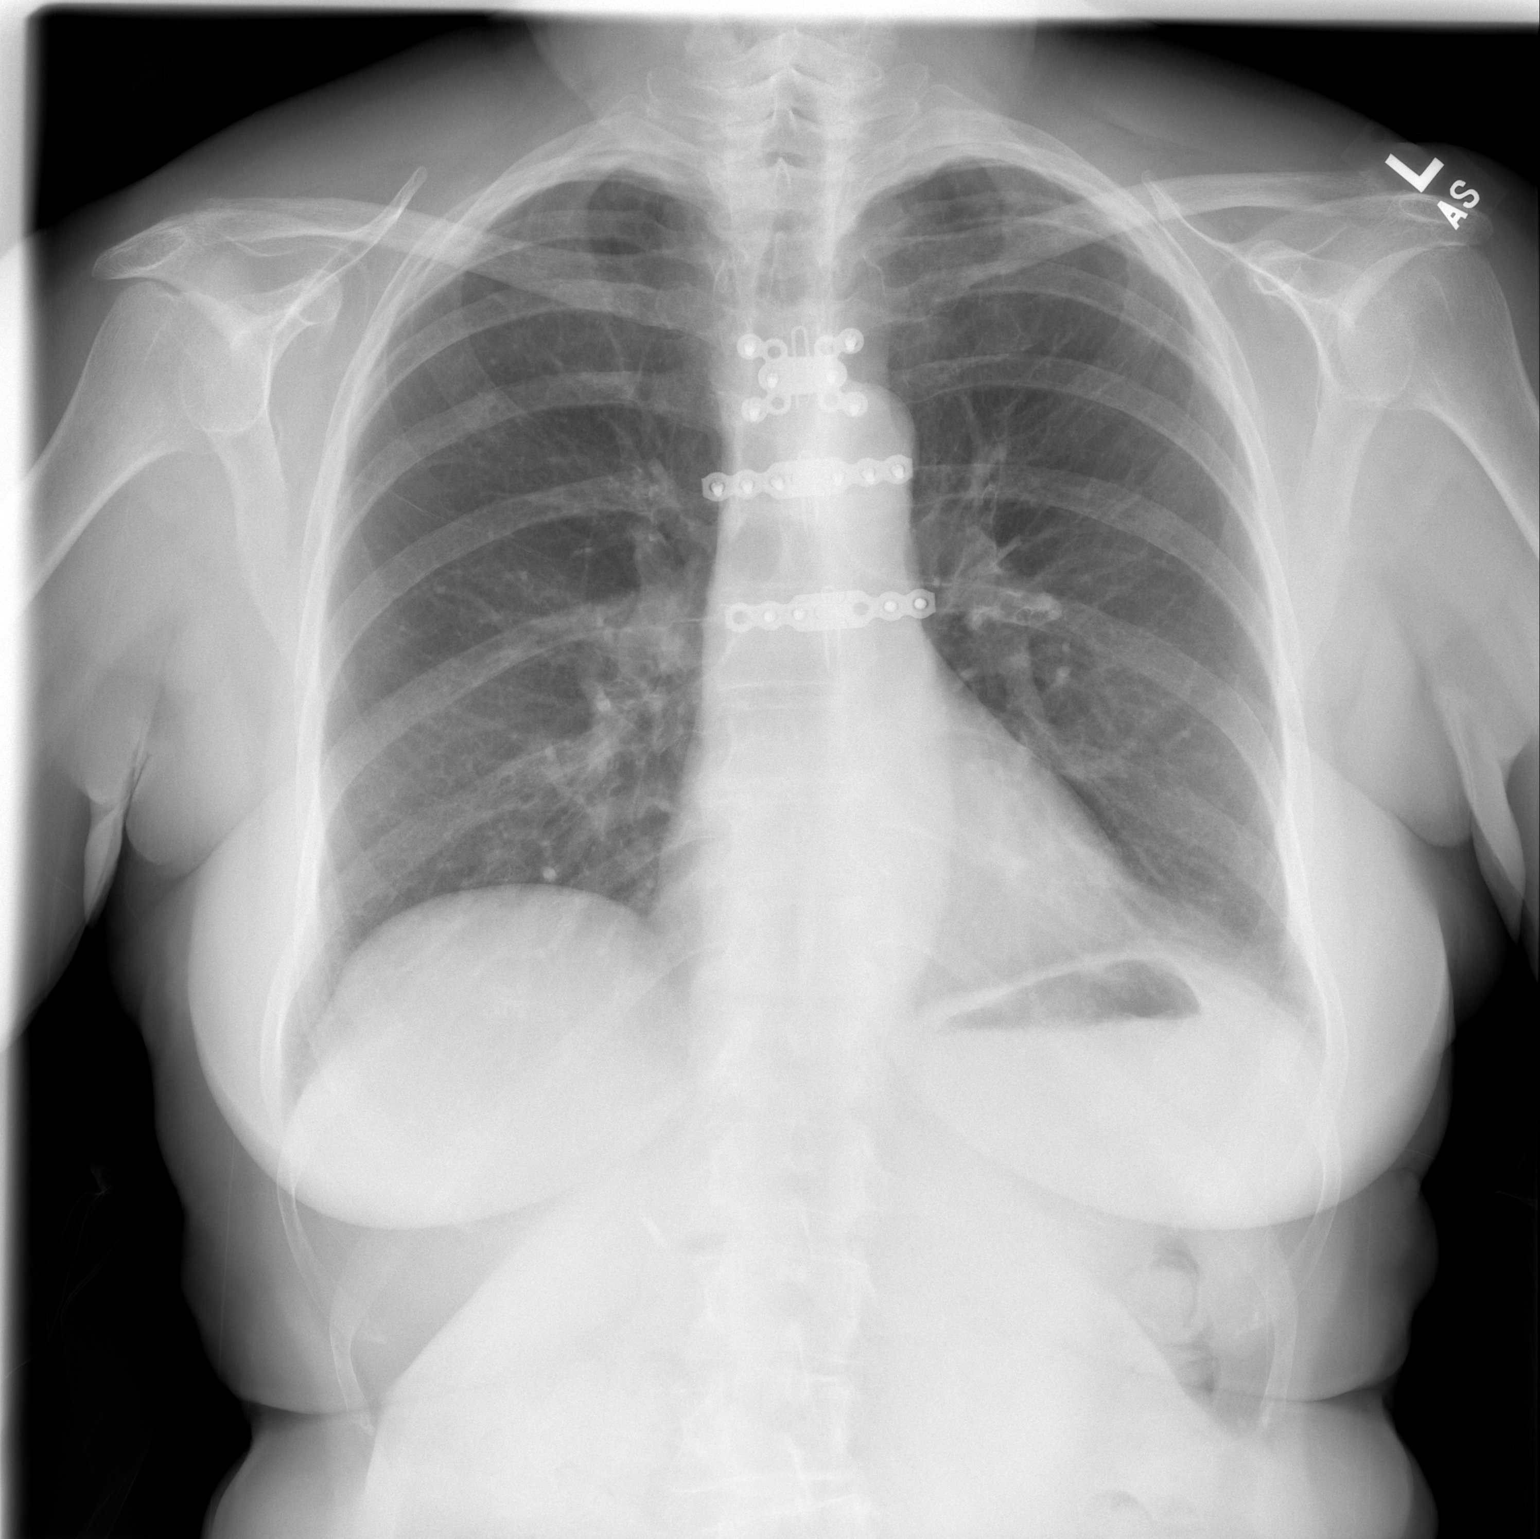

[w chest lat]
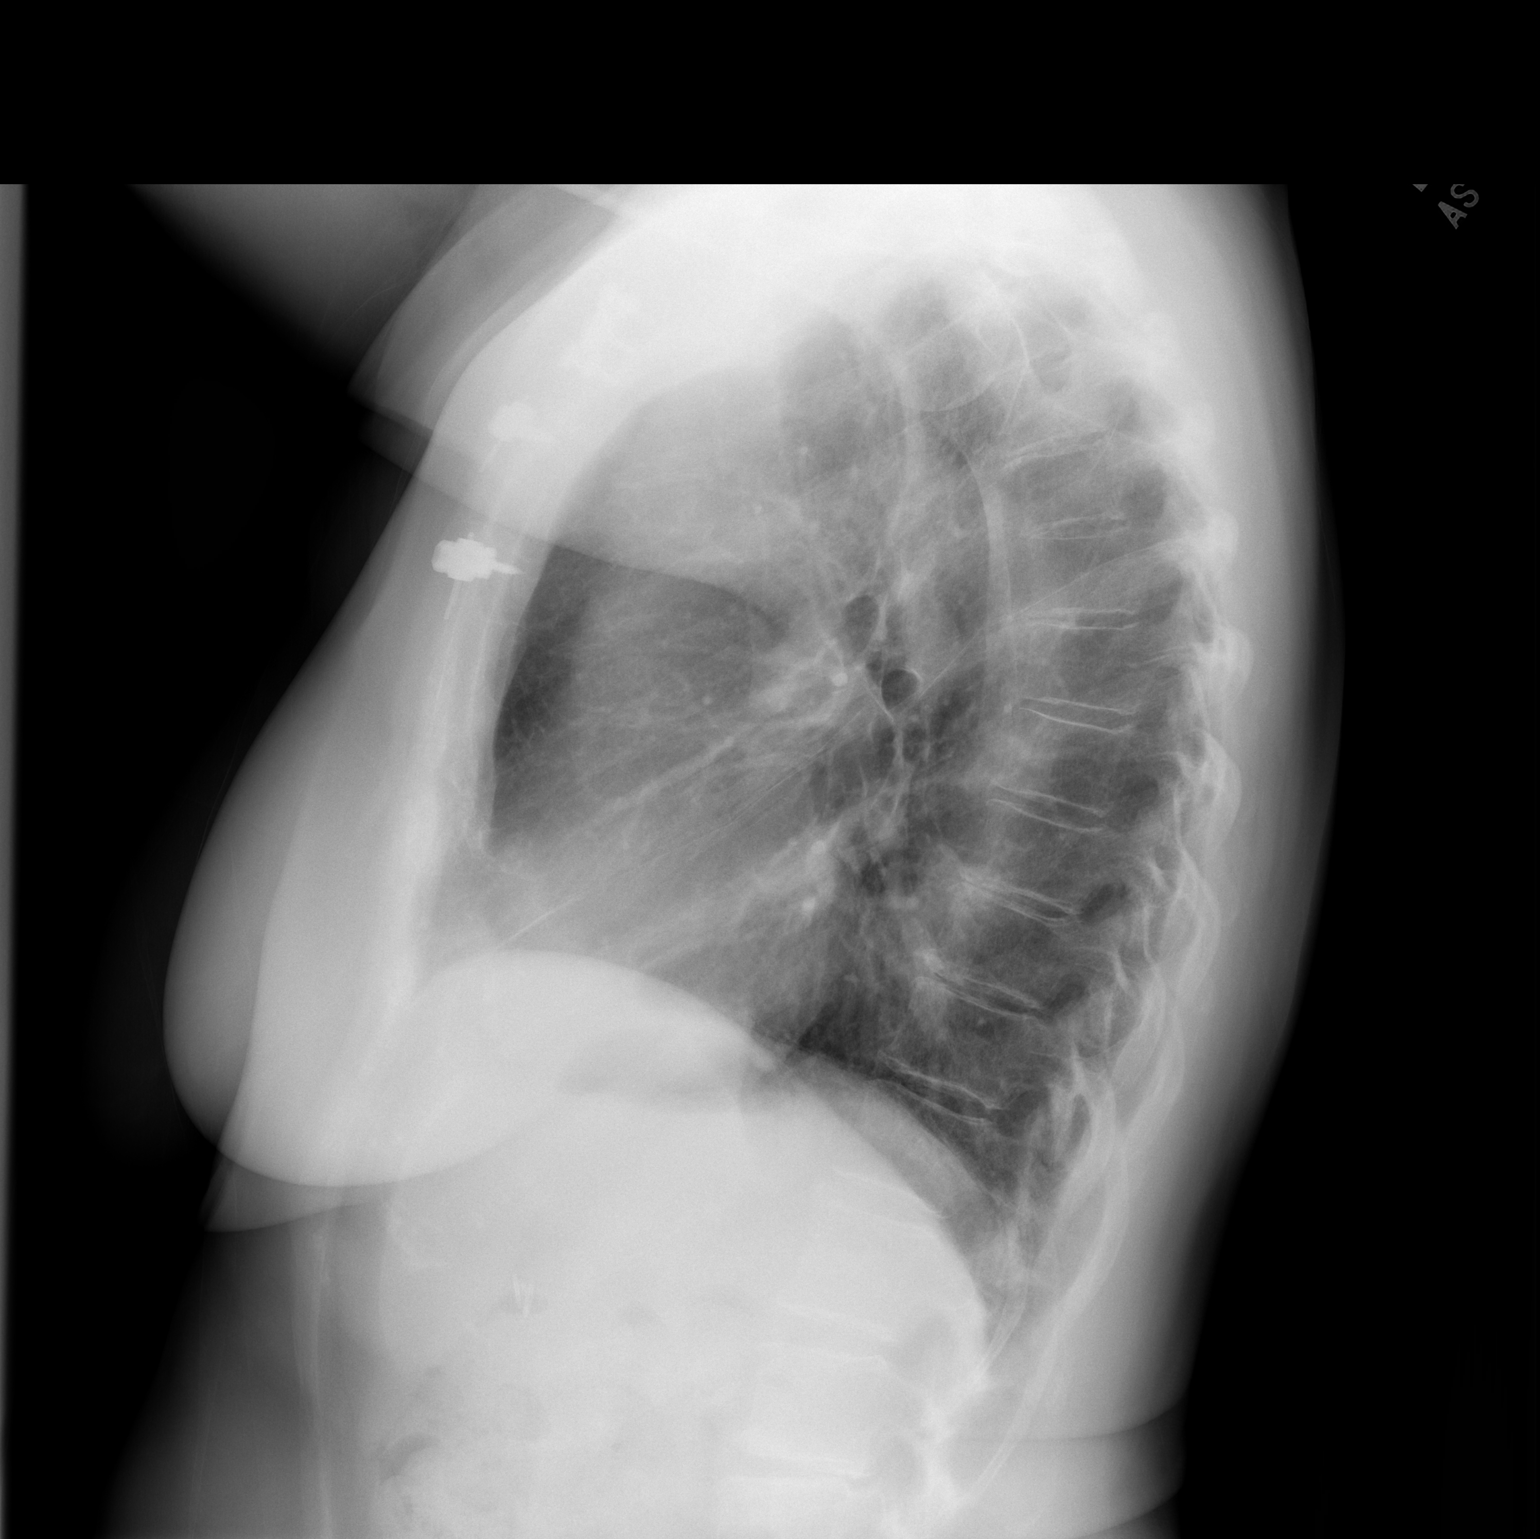

[2 of 2 positions shown; findings below may reference images not displayed]

FINDINGS: The lungs are well-expanded. There is no focal infiltrate. There is
no pleural effusion. The heart and pulmonary vascularity are normal.
The mediastinum is normal in width. There is no pleural effusion.
The patient has undergone previous resection of substernal border
with sternal fixation devices present.
IMPRESSION: There is no active cardiopulmonary disease.

## 2017-02-20 ENCOUNTER — Other Ambulatory Visit (HOSPITAL_COMMUNITY): Payer: Self-pay | Admitting: Family Medicine

## 2017-02-20 DIAGNOSIS — Z1231 Encounter for screening mammogram for malignant neoplasm of breast: Secondary | ICD-10-CM

## 2017-03-04 ENCOUNTER — Ambulatory Visit (INDEPENDENT_AMBULATORY_CARE_PROVIDER_SITE_OTHER): Payer: Medicare HMO | Admitting: Otolaryngology

## 2017-03-04 DIAGNOSIS — R490 Dysphonia: Secondary | ICD-10-CM | POA: Diagnosis not present

## 2017-03-04 DIAGNOSIS — K219 Gastro-esophageal reflux disease without esophagitis: Secondary | ICD-10-CM

## 2017-03-08 ENCOUNTER — Ambulatory Visit (HOSPITAL_COMMUNITY)
Admission: RE | Admit: 2017-03-08 | Discharge: 2017-03-08 | Disposition: A | Discharge status: Home / Self Care | Payer: Medicare HMO | Source: Ambulatory Visit | Attending: Family Medicine | Admitting: Family Medicine

## 2017-03-08 DIAGNOSIS — Z1231 Encounter for screening mammogram for malignant neoplasm of breast: Secondary | ICD-10-CM | POA: Diagnosis not present

## 2017-05-09 DIAGNOSIS — I10 Essential (primary) hypertension: Secondary | ICD-10-CM | POA: Insufficient documentation

## 2017-05-12 DIAGNOSIS — E039 Hypothyroidism, unspecified: Secondary | ICD-10-CM | POA: Insufficient documentation

## 2017-06-03 ENCOUNTER — Ambulatory Visit (INDEPENDENT_AMBULATORY_CARE_PROVIDER_SITE_OTHER): Payer: Medicare HMO | Admitting: Otolaryngology

## 2017-06-03 DIAGNOSIS — R490 Dysphonia: Secondary | ICD-10-CM

## 2017-06-03 DIAGNOSIS — R070 Pain in throat: Secondary | ICD-10-CM

## 2017-06-03 DIAGNOSIS — K219 Gastro-esophageal reflux disease without esophagitis: Secondary | ICD-10-CM

## 2017-06-21 ENCOUNTER — Ambulatory Visit (INDEPENDENT_AMBULATORY_CARE_PROVIDER_SITE_OTHER): Payer: Medicare HMO | Admitting: Internal Medicine

## 2017-06-21 ENCOUNTER — Encounter (INDEPENDENT_AMBULATORY_CARE_PROVIDER_SITE_OTHER): Payer: Self-pay | Admitting: Internal Medicine

## 2017-06-21 VITALS — BP 120/68 | HR 64 | Temp 97.4°F | Ht 62.0 in | Wt 163.8 lb

## 2017-06-21 DIAGNOSIS — R159 Full incontinence of feces: Secondary | ICD-10-CM | POA: Diagnosis not present

## 2017-06-21 NOTE — Patient Instructions (Signed)
Samples of Dexilant

## 2017-06-21 NOTE — Progress Notes (Signed)
Subjective:    Patient ID: [name], female    DOB: [birth year], 77 y.o.   MRN: [id number]  HPI Presents today with c/o GERD. She takes the Prilosec at lunch and before dinner. She says her stools is leaking out of her rectum.  She has had fecal seepage x 2 weeks. She actually states that for the past 2 days she has not had any fecal seepage.    Her appetite is good. No weight loss.  Just started Levothyroxine in January.    Her last colonoscopy was in December of 2016 (screening): normal. Single anal papilae and small hemorrhoids. Underwent a simple hemorrhoidectomies in 2017 for a prolapsing hemorrhoid.  Review of Systems Past Medical History:  Diagnosis Date  . 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 HTN (hypertension)   . Low TSH level 01/2010  . Neuromuscular disorder ([place])   . Paroxysmal atrial fibrillation (HCC)    normal echiocardiogram and stress nuclear; low TSH with normal T3 and T4   . PONV (postoperative nausea and vomiting)     Past Surgical History:  Procedure Laterality Date  . ABDOMINAL HYSTERECTOMY     w/o oophorectomy  . APPENDECTOMY    . BUNIONECTOMY     bilateral  . CHOLECYSTECTOMY    . colonoscopy  03/09/15  . COLONOSCOPY N/A 03/09/2015   Procedure: COLONOSCOPY;  Surgeon: Rogene Houston, MD;  [facility];  Service: Endoscopy;  Laterality: N/A;  830  . ESOPHAGOGASTRODUODENOSCOPY  12/27/2011   Procedure: ESOPHAGOGASTRODUODENOSCOPY (EGD);  Surgeon: Rogene Houston, MD;  [facility];  Service: Endoscopy;  Laterality: N/A;  250  . HEMORRHOID SURGERY N/A 06/20/2015   Procedure: HEMORRHOIDECTOMY;  Surgeon: Aviva Signs, MD;  [facility];  Service: General;  Laterality: N/A;  . PAROTIDECTOMY Right 11/16/2014   Procedure: RIGHT PAROTIDECTOMY;  Surgeon: Leta Baptist, MD;  Location: [place];  Service: ENT;  Laterality: Right;  . RESECTION OF MEDIASTINAL MASS N/A 10/01/2014    Procedure: RESECTION OF MEDIASTINAL MASS;  Surgeon: Grace Isaac, MD;  Location: [place];  Service: Thoracic;  Laterality: N/A;  . STERNOTOMY N/A 10/01/2014   Procedure: STERNOTOMY;  Surgeon: Grace Isaac, MD;  Location: [place];  Service: Thoracic;  Laterality: N/A;  . TONSILLECTOMY    . TONSILLECTOMY    . VEIN LIGATION AND STRIPPING      Allergies  Allergen Reactions  . Penicillins Other (See Comments)    Has patient had a PCN reaction causing immediate rash, facial/tongue/throat swelling, SOB or lightheadedness with hypotension: No Has patient had a PCN reaction causing severe rash involving mucus membranes or skin necrosis: No Has patient had a PCN reaction that required hospitalization: No Has patient had a PCN reaction occurring within the last 10 years: No If all of the above answers are "NO", then may proceed with Cephalosporin use.    Unknown "weird tingling feeling in legs"  . Sulfonamide Derivatives Other (See Comments)    Leg Pain    Current Outpatient Medications on File Prior to Visit  Medication Sig Dispense Refill  . aspirin EC 81 MG tablet Take 81 mg by mouth daily.    Marland Kitchen ipratropium (ATROVENT) 0.06 % nasal spray Place 2 sprays into both nostrils 2 (two) times daily.    Marland Kitchen levothyroxine (SYNTHROID, LEVOTHROID) 88 MCG tablet Take 88 mcg by mouth daily before breakfast.    . lisinopril-hydrochlorothiazide (PRINZIDE,ZESTORETIC) 10-12.5  MG per tablet Take 1 tablet by mouth daily. 90 tablet 1  . metoprolol tartrate (LOPRESSOR) 25 MG tablet Take 1 tablet (25 mg total) by mouth 2 (two) times daily. 180 tablet 1  . Multiple Vitamins-Minerals (HAIR/SKIN/NAILS PO) Take 1 tablet by mouth every morning.     . Omega-3 Fatty Acids (OMEGA-3 2100 PO) Take 1 capsule by mouth every morning.     Marland Kitchen omeprazole (PRILOSEC) 20 MG capsule Take 20 mg by mouth 2 (two) times daily before a meal.   3  . potassium chloride (K-DUR) 10 MEQ tablet Take 10 mEq by mouth daily.    . ranitidine  (ZANTAC) 150 MG tablet Take 150 mg by mouth at bedtime.    Marland Kitchen zinc gluconate 50 MG tablet Take 50 mg by mouth daily.       No current facility-administered medications on file prior to visit.         Objective:   Physical Exam Blood pressure 120/68, pulse 64, temperature (!) 97.4 F (36.3 C), height 5\' 2"  (1.575 m), weight 163 lb 12.8 oz (74.3 kg). Alert and oriented. Skin warm and dry. Oral mucosa is moist.   . Sclera anicteric, conjunctivae is pink. Thyroid not enlarged. No cervical lymphadenopathy. Lungs clear. Heart regular rate and rhythm.  Abdomen is soft. Bowel sounds are positive. No hepatomegaly. No abdominal masses felt. No tenderness.  No edema to lower extremities.  Good anal sphincter tone.         Assessment & Plan:  Fecal seepage. Am going to switch her to Dexilant x 5 boxes. She will call with a progress report.

## 2017-07-03 ENCOUNTER — Encounter (HOSPITAL_COMMUNITY): Payer: Self-pay | Admitting: Adult Health

## 2017-07-03 ENCOUNTER — Inpatient Hospital Stay (HOSPITAL_COMMUNITY): Payer: Medicare HMO | Attending: Hematology | Admitting: Adult Health

## 2017-07-03 VITALS — BP 136/70 | HR 71 | Temp 97.7°F | Resp 16 | Wt 162.7 lb

## 2017-07-03 DIAGNOSIS — Z8589 Personal history of malignant neoplasm of other organs and systems: Secondary | ICD-10-CM | POA: Insufficient documentation

## 2017-07-03 DIAGNOSIS — C07 Malignant neoplasm of parotid gland: Secondary | ICD-10-CM

## 2017-07-03 NOTE — Patient Instructions (Signed)
[facility] Discharge Instructions  You saw Gretchen Dawson, NP, today See Amy at checkout for appointments.    Thank you for choosing [facility] to provide your oncology and hematology care.  To afford each patient quality time with our provider, please arrive at least 15 minutes before your scheduled appointment time.   If you have a lab appointment with the Cancer Center please come in thru the  Main Entrance and check in at the main information desk  You need to re-schedule your appointment should you arrive 10 or more minutes late.  We strive to give you quality time with our providers, and arriving late affects you and other patients whose appointments are after yours.  Also, if you no show three or more times for appointments you may be dismissed from the clinic at the providers discretion.     Again, thank you for choosing [facility].  Our hope is that these requests will decrease the amount of time that you wait before being seen by our physicians.       _____________________________________________________________  Should you have questions after your visit to [facility], please contact our office at [phone] between the hours of 8:30 a.m. and 4:30 p.m.  Voicemails left after 4:30 p.m. will not be returned until the following business day.  For prescription refill requests, have your pharmacy contact our office.       Resources For Cancer Patients and their Caregivers ? American Cancer Society: Can assist with transportation, wigs, general needs, runs Look Good Feel Better.        1-888-227-6333 ? Cancer Care: Provides financial assistance, online support groups, medication/co-pay assistance.  1-800-813-HOPE (4673) ? [facility] Assists [place] cancer patients and their families through emotional , educational and financial support.   336-427-4357 ? [place] DSS Where to apply for food stamps, Medicaid and utility assistance. 336-342-1394 ? [facility]: Transportation to medical appointments. 336-347-2287 ? Social Security Administration: May apply for disability if have a Stage IV cancer. 336-342-7796 1-800-772-1213 ? [place] Aging, Disability and Transit Services: Assists with nutrition, care and transit needs. 336-349-2343  Cancer Center Support Programs:   > Cancer Support Group  2nd Tuesday of the month 1pm-2pm, Journey Room   > Creative Journey  3rd Tuesday of the month 1130am-1pm, Journey Room     

## 2017-07-07 ENCOUNTER — Encounter (HOSPITAL_COMMUNITY): Payer: Self-pay | Admitting: Adult Health

## 2017-07-07 NOTE — Progress Notes (Signed)
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  Partially visualized anterior mediastinal mass, at least 7 cm; appearance suggests thyroid nodule, but has no thyroid continuity.       08/27/2014 Imaging    CT chest: 9.6 x 3.8 x 6.1 cm lobulated, enhancing anterior mediastinal mass.       10/01/2014 Surgery    Resection of mediastinal mass/sternotomy Servando Snare): No malignancy; benign thyroid tissue.      11/16/2014 Initial Diagnosis    Malignant tumor of parotid gland ([place])      11/16/2014 Surgery    Right parotidectomy with facial nerve dissection & preservation ([place]): Adenocarcinoma, intermediate to focally high grade, nonspecific intercalated duct type. Tumor measures 1.8 cm. No perineural/angiolymphatic invasion. Negative margins.       11/16/2014 Pathologic Stage    pT1, pNx      12/07/2014 Imaging    PET scan: No evidence of metastatic disease or suspecious metabolic activity. Interval resection of right parotid tumor without suspicious residual activity. Interval resection of anterior mediastinal mass. Stable 58mm LUL lung nodule.       12/09/2014 Miscellaneous    No recommendation for adjuvant chemotherapy ([place]).       12/30/2014 - 02/09/2015 Radiation Therapy    Completed adjuvant IMRT Isidore Moos). Parotid bed, 60 Gy.  No nodal echelon treatment.         HISTORY OF PRESENT ILLNESS:  (From Dr. Laverle Patter note on 01/02/17)      INTERVAL HISTORY:  [name] 77 y.o. female returns for routine follow-up for right parotid cancer.   Here today  unaccompanied.   Overall, she tells me she has been feeling well. Appetite and energy levels 100%.  She has been struggling with chronic "red throat", followed/managed by Dr. Benjamine Mola with ENT. She tells me that he suspects her symptoms are secondary to GERD.  She has occasional (R) jaw tenderness to palpation; it is improved since she initially finished treatment for her cancer.  Persistent tenderness is stable and doesn't interfere with ADLs.  Denies any new neck masses/nodules.  She has chronic eye tearing, which is stable; she attributes some of this "to my allergies."  Also reports chronic (R) LE extremity swelling, which is intermittent.   Denies any calf pain, tenderness, or skin redness.   She tells me she is due to see Dr. Benjamine Mola again in 07/2017.   Otherwise, she is largely without other complaints today.    REVIEW OF SYSTEMS:  Review of Systems - Oncology Per HPI. Otherwise 12 point ROS completed and negative except as stated above.    PAST MEDICAL/SURGICAL HISTORY:  Past Medical History:  Diagnosis Date  . 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 HTN (hypertension)   . Low TSH level 01/2010  . Neuromuscular disorder ([place])   . Paroxysmal atrial fibrillation ([facility])    normal echiocardiogram and stress nuclear; low TSH with normal T3 and T4   . PONV (postoperative nausea and vomiting)    Past Surgical History:  Procedure Laterality Date  . ABDOMINAL HYSTERECTOMY     w/o oophorectomy  . APPENDECTOMY    . BUNIONECTOMY     bilateral  . CHOLECYSTECTOMY    . colonoscopy  03/09/15  . COLONOSCOPY N/A 03/09/2015   Procedure: COLONOSCOPY;  Surgeon: Rogene Houston, MD;  [facility];  Service: Endoscopy;  Laterality: N/A;  830  . ESOPHAGOGASTRODUODENOSCOPY  12/27/2011   Procedure: ESOPHAGOGASTRODUODENOSCOPY (EGD);  Surgeon: Rogene Houston, MD;  [facility];  Service: Endoscopy;  Laterality: N/A;  250  . HEMORRHOID  SURGERY N/A 06/20/2015   Procedure: HEMORRHOIDECTOMY;  Surgeon: Aviva Signs, MD;  Location: [facility];  Service: General;  Laterality: N/A;  . PAROTIDECTOMY Right 11/16/2014   Procedure: RIGHT PAROTIDECTOMY;  Surgeon: Leta Baptist, MD;  Location: [facility];  Service: ENT;  Laterality: Right;  . RESECTION OF MEDIASTINAL MASS N/A 10/01/2014   Procedure: RESECTION OF MEDIASTINAL MASS;  Surgeon: Grace Isaac, MD;  Location: [facility];  Service: Thoracic;  Laterality: N/A;  . STERNOTOMY N/A 10/01/2014   Procedure: STERNOTOMY;  Surgeon: Grace Isaac, MD;  Location: [facility];  Service: Thoracic;  Laterality: N/A;  . TONSILLECTOMY    . TONSILLECTOMY    . VEIN LIGATION AND STRIPPING       SOCIAL HISTORY:  Social History   Socioeconomic History  . Marital status: Married    Spouse name: Not on file  . Number of children: Not on file  . Years of education: Not on file  . Highest education level: Not on file  Occupational History  . Not on file  Social Needs  . Financial resource strain: Not on file  . Food insecurity:    Worry: Not on file    Inability: Not on file  . Transportation needs:    Medical: Not on file    Non-medical: Not on file  Tobacco Use  . Smoking status: Never Smoker  . Smokeless tobacco: Never Used  . Tobacco comment: does not smoke  Substance and Sexual Activity  . Alcohol use: Yes    Alcohol/week: 0.0 oz    Comment: 1 glass of wine at a time just socially  . Drug use: No  . Sexual activity: Not on file  Lifestyle  . Physical activity:    Days per week: Not on file    Minutes per session: Not on file  . Stress: Not on file  Relationships  . Social connections:    Talks on phone: Not on file    Gets together: Not on file    Attends religious service: Not on file    Active member of club or organization: Not on file    Attends meetings of clubs or organizations: Not on file    Relationship status: Not on file  . Intimate partner violence:    Fear  of current or ex partner: Not on file    Emotionally abused: Not on file    Physically abused: Not on file    Forced sexual activity: Not on file  Other Topics Concern  . Not on file  Social History Narrative   Married, employment - Network engineer. No caffeine. 77 yr education     FAMILY HISTORY:  Family History  Problem Relation Age of Onset  . Heart disease Father   . Hypertension Father   . Diabetes Mother   . Hypertension Mother     CURRENT MEDICATIONS:  Outpatient Encounter Medications as of 07/03/2017  Medication Sig  .  aspirin EC 81 MG tablet Take 81 mg by mouth daily.  Marland Kitchen ipratropium (ATROVENT) 0.06 % nasal spray Place 2 sprays into both nostrils 2 (two) times daily.  Marland Kitchen levothyroxine (SYNTHROID, LEVOTHROID) 88 MCG tablet Take 88 mcg by mouth daily before breakfast.  . lisinopril-hydrochlorothiazide (PRINZIDE,ZESTORETIC) 10-12.5 MG per tablet Take 1 tablet by mouth daily.  . metoprolol tartrate (LOPRESSOR) 25 MG tablet Take 1 tablet (25 mg total) by mouth 2 (two) times daily.  . Multiple Vitamins-Minerals (HAIR/SKIN/NAILS PO) Take 1 tablet by mouth every morning.   . Omega-3 Fatty Acids (OMEGA-3 2100 PO) Take 1 capsule by mouth every morning.   Marland Kitchen omeprazole (PRILOSEC) 20 MG capsule Take 20 mg by mouth 2 (two) times daily before a meal.   . potassium chloride (K-DUR) 10 MEQ tablet Take 10 mEq by mouth daily.  . ranitidine (ZANTAC) 150 MG tablet Take 150 mg by mouth at bedtime.  Marland Kitchen zinc gluconate 50 MG tablet Take 50 mg by mouth daily.     No facility-administered encounter medications on file as of 07/03/2017.     ALLERGIES:  Allergies  Allergen Reactions  . Penicillins Other (See Comments)    Has patient had a PCN reaction causing immediate rash, facial/tongue/throat swelling, SOB or lightheadedness with hypotension: No Has patient had a PCN reaction causing severe rash involving mucus membranes or skin necrosis: No Has patient had a PCN reaction that required  hospitalization: No Has patient had a PCN reaction occurring within the last 10 years: No If all of the above answers are "NO", then may proceed with Cephalosporin use.    Unknown "weird tingling feeling in legs"  . Sulfonamide Derivatives Other (See Comments)    Leg Pain     PHYSICAL EXAM:  ECOG Performance status: 0 - Asymptomatic  Vitals:   07/03/17 1516  BP: 136/70  Pulse: 71  Resp: 16  Temp: 97.7 F (36.5 C)  SpO2: 100%   Filed Weights   07/03/17 1516  Weight: 162 lb 11.2 oz (73.8 kg)    Physical Exam  Constitutional: She is oriented to person, place, and time. She appears well-developed and well-nourished.  HENT:  Head: Normocephalic.  Mouth/Throat: Oropharynx is clear and moist. No oropharyngeal exudate.  (R) jaw area with healed surgical scar; no palpable mass or nodularity.   Eyes: Pupils are equal, round, and reactive to light. Conjunctivae are normal. No scleral icterus.  Neck: Normal range of motion. Neck supple.  Cardiovascular: Normal rate and regular rhythm.  Pulmonary/Chest: Effort normal and breath sounds normal. No respiratory distress.  Abdominal: Soft. Bowel sounds are normal. There is no tenderness.  Musculoskeletal: Normal range of motion. She exhibits no edema.  Lymphadenopathy:    She has no cervical adenopathy.       Right: No supraclavicular adenopathy present.       Left: No supraclavicular adenopathy present.  Neurological: She is alert and oriented to person, place, and time. No cranial nerve deficit.  Skin: Skin is warm and dry. No rash noted.  Psychiatric: Judgment normal.  Nursing note and vitals reviewed.    LABORATORY DATA:  I have reviewed the labs as listed.  CBC    Component Value Date/Time   WBC 5.4 11/19/2016 2319   RBC 4.03 11/19/2016 2319   HGB 12.8 11/19/2016 2319   HCT 36.3 11/19/2016 2319   PLT 178 11/19/2016 2319   MCV 90.1 11/19/2016 2319   MCH 31.8 11/19/2016 2319   MCHC 35.3 11/19/2016 2319   RDW 12.7  11/19/2016 2319   LYMPHSABS 1.8 11/19/2016 2319   MONOABS 0.6 11/19/2016 2319   EOSABS 0.1 11/19/2016 2319   BASOSABS 0.0 11/19/2016 2319   CMP Latest Ref Rng & Units 11/19/2016 12/15/2015 12/14/2015  Glucose 65 - 99 mg/dL 116(H) - -  BUN 6 - 20 mg/dL 16 - 18  Creatinine 0.44 - 1.00 mg/dL 0.87 0.90 -  Sodium 135 - 145 mmol/L 135 - -  Potassium 3.5 - 5.1 mmol/L 3.3(L) - -  Chloride 101 - 111 mmol/L 102 - -  CO2 22 - 32 mmol/L 25 - -  Calcium 8.9 - 10.3 mg/dL 9.1 - -  Total Protein 6.5 - 8.1 g/dL 7.2 - -  Total Bilirubin 0.3 - 1.2 mg/dL 0.6 - -  Alkaline Phos 38 - 126 U/L 63 - -  AST 15 - 41 U/L 23 - -  ALT 14 - 54 U/L 22 - -    PENDING LABS:    DIAGNOSTIC IMAGING:  *The following radiologic images and reports have been reviewed independently and agree with below findings.  CT chest: 12/15/15 CLINICAL DATA:  Malignant tumor of the right parotid gland status post radiation therapy in August 2016. Status post anterior mediastinal mass resection 10/01/2014 revealing benign goiter. Restaging.  EXAM: CT CHEST WITH CONTRAST  TECHNIQUE: Multidetector CT imaging of the chest was performed during intravenous contrast administration.  CONTRAST:  135mL ISOVUE-300 IOPAMIDOL (ISOVUE-300) INJECTION 61%  COMPARISON:  06/03/2015 PET-CT.  08/27/2014 chest CT.  FINDINGS: Cardiovascular: Normal heart size. No significant pericardial fluid/thickening. Atherosclerotic nonaneurysmal thoracic aorta. Normal caliber pulmonary arteries. No central pulmonary emboli.  Mediastinum/Nodes: No discrete thyroid nodules. Surgical clips are seen inferior to the thyroid gland. Unremarkable esophagus. No pathologically enlarged axillary, mediastinal or hilar lymph nodes.  Lungs/Pleura: No pneumothorax. No pleural effusion. Right lower lobe 4 mm solid pulmonary nodule (series 4/ image 33) is stable since 08/27/2014. Peripheral left upper lobe 4 mm solid pulmonary nodule (series 4/image 15), stable  since 08/27/2014. No acute consolidative airspace disease, lung masses or new significant pulmonary nodules.  Upper abdomen: Cholecystectomy. Stable mild diffuse intrahepatic biliary ductal dilatation and dilated common bile duct (14 mm diameter), probably due to chronic postcholecystectomy change. No radiopaque choledocholithiasis in the visualized common bile duct.  Musculoskeletal: No aggressive appearing focal osseous lesions. Intact appearing surgical hardware is again noted in anterior sternum. Mild thoracic spondylosis.  IMPRESSION: 1. No evidence of metastatic disease in the chest. Two stable 4 mm pulmonary nodules, for which 15 month stability has been demonstrated, considered benign. 2. Aortic atherosclerosis.   Electronically Signed   By: Ilona Sorrel M.D.   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 Underwent right lateral parotidectomy with facial nerve dissection/preservation on 11/16/14 with Dr. Benjamine Mola. Margins were technically negative, but extremely close.  She sought 2nd opinion from [facility]; returned to [facility] for adjuvant treatment.  Received adjuvant radiation therapy with Dr. Isidore Moos, which she completed from 12/30/14-02/09/15.  -Clinically NED today on exam. Continue follow-up with ENT as directed. It has been nearly 2.5 years since her initial diagnosis and treatment without evidence of recurrent disease; this is very favorable. -No role for surveillance imaging; will order imaging in the future only as clinically indicated.   -Return to cancer center in 6 months for continued surveillance. No labs needed, as her neck  was not radiated thus decreasing her risk for acquired hypothyroidism from radiation therapy.    Mediastinal mass:  -Diagnosed in 2016, initially concerning for malignancy. Underwent mediastinal mass resection on  10/01/14 with pathology revealing benign goiter.   -Most recent CT chest on 12/15/15 showed no evidence of metastatic disease in the chest. Several pulmonary nodules noted stability for 15 months at that time and considered benign.   -No role for additional surveillance chest imaging; can obtain repeat imaging as clinically indicated.   Health maintenance:  -Recommend continued follow-up with PCP as directed.     Dispo:  -Return to cancer center in 6 months for follow-up; no labs needed.    All questions were answered to patient's stated satisfaction. Encouraged patient to call with any new concerns or questions before her next visit to the cancer center and we can certain see her sooner, if needed.      Orders placed this encounter:  No orders of the defined types were placed in this encounter.     Mike Craze, NP [place] [phone]

## 2017-08-05 ENCOUNTER — Ambulatory Visit (INDEPENDENT_AMBULATORY_CARE_PROVIDER_SITE_OTHER): Payer: Medicare HMO | Admitting: Otolaryngology

## 2017-08-05 DIAGNOSIS — K219 Gastro-esophageal reflux disease without esophagitis: Secondary | ICD-10-CM | POA: Diagnosis not present

## 2017-08-05 DIAGNOSIS — R070 Pain in throat: Secondary | ICD-10-CM

## 2017-12-02 ENCOUNTER — Ambulatory Visit (INDEPENDENT_AMBULATORY_CARE_PROVIDER_SITE_OTHER): Payer: Medicare HMO | Admitting: Otolaryngology

## 2017-12-02 DIAGNOSIS — K219 Gastro-esophageal reflux disease without esophagitis: Secondary | ICD-10-CM

## 2017-12-02 DIAGNOSIS — R490 Dysphonia: Secondary | ICD-10-CM

## 2017-12-02 DIAGNOSIS — R070 Pain in throat: Secondary | ICD-10-CM | POA: Diagnosis not present

## 2018-01-01 ENCOUNTER — Encounter (HOSPITAL_COMMUNITY): Payer: Self-pay | Admitting: Internal Medicine

## 2018-01-01 ENCOUNTER — Inpatient Hospital Stay (HOSPITAL_COMMUNITY): Payer: Medicare HMO | Attending: Internal Medicine | Admitting: Internal Medicine

## 2018-01-01 ENCOUNTER — Inpatient Hospital Stay (HOSPITAL_COMMUNITY): Payer: Medicare HMO

## 2018-01-01 VITALS — BP 142/76 | HR 73 | Temp 97.9°F | Resp 18 | Wt 160.0 lb

## 2018-01-01 DIAGNOSIS — R918 Other nonspecific abnormal finding of lung field: Secondary | ICD-10-CM | POA: Insufficient documentation

## 2018-01-01 DIAGNOSIS — M542 Cervicalgia: Secondary | ICD-10-CM | POA: Insufficient documentation

## 2018-01-01 DIAGNOSIS — I10 Essential (primary) hypertension: Secondary | ICD-10-CM | POA: Diagnosis not present

## 2018-01-01 DIAGNOSIS — C07 Malignant neoplasm of parotid gland: Secondary | ICD-10-CM

## 2018-01-01 DIAGNOSIS — Z85841 Personal history of malignant neoplasm of brain: Secondary | ICD-10-CM | POA: Insufficient documentation

## 2018-01-01 LAB — COMPREHENSIVE METABOLIC PANEL
ALT: 20 U/L (ref 0–44)
ANION GAP: 9 (ref 5–15)
AST: 22 U/L (ref 15–41)
Albumin: 3.9 g/dL (ref 3.5–5.0)
Alkaline Phosphatase: 57 U/L (ref 38–126)
BILIRUBIN TOTAL: 0.8 mg/dL (ref 0.3–1.2)
BUN: 14 mg/dL (ref 8–23)
CHLORIDE: 101 mmol/L (ref 98–111)
CO2: 23 mmol/L (ref 22–32)
Calcium: 9.1 mg/dL (ref 8.9–10.3)
Creatinine, Ser: 0.93 mg/dL (ref 0.44–1.00)
GFR, EST NON AFRICAN AMERICAN: 57 mL/min — AB (ref >60)
Glucose, Bld: 106 mg/dL — ABNORMAL HIGH (ref 70–99)
POTASSIUM: 4 mmol/L (ref 3.5–5.1)
Sodium: 133 mmol/L — ABNORMAL LOW (ref 135–145)
TOTAL PROTEIN: 7.7 g/dL (ref 6.5–8.1)

## 2018-01-01 LAB — CBC WITH DIFFERENTIAL/PLATELET
Abs Immature Granulocytes: 0.01 10*3/uL (ref 0.00–0.07)
BASOS PCT: 1 %
Basophils Absolute: 0.1 10*3/uL (ref 0.0–0.1)
EOS ABS: 0 10*3/uL (ref 0.0–0.5)
Eosinophils Relative: 1 %
HEMATOCRIT: 39.9 % (ref 36.0–46.0)
Hemoglobin: 13.4 g/dL (ref 12.0–15.0)
Immature Granulocytes: 0 %
LYMPHS ABS: 1.8 10*3/uL (ref 0.7–4.0)
Lymphocytes Relative: 30 %
MCH: 31.7 pg (ref 26.0–34.0)
MCHC: 33.6 g/dL (ref 30.0–36.0)
MCV: 94.3 fL (ref 80.0–100.0)
MONOS PCT: 9 %
Monocytes Absolute: 0.5 10*3/uL (ref 0.1–1.0)
NEUTROS PCT: 59 %
Neutro Abs: 3.5 10*3/uL (ref 1.7–7.7)
PLATELETS: 203 10*3/uL (ref 150–400)
RBC: 4.23 MIL/uL (ref 3.87–5.11)
RDW: 12.4 % (ref 11.5–15.5)
WBC: 5.9 10*3/uL (ref 4.0–10.5)
nRBC: 0 % (ref 0.0–0.2)

## 2018-01-01 LAB — LACTATE DEHYDROGENASE: LDH: 181 U/L (ref 98–192)

## 2018-01-01 NOTE — Progress Notes (Signed)
Diagnosis Malignant tumor of parotid gland ([place]) - Plan: CBC with Differential/Platelet, Comprehensive metabolic panel, Lactate dehydrogenase, CT SOFT TISSUE NECK W CONTRAST  Abnormal findings on diagnostic imaging of lung - Plan: CT CHEST W CONTRAST  Neck pain - Plan: CBC with Differential/Platelet, Comprehensive metabolic panel, Lactate dehydrogenase, CT SOFT TISSUE NECK W CONTRAST  Staging Cancer Staging No matching staging information was found for the patient.  Assessment and Plan: 1.  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 Underwent right lateral parotidectomy with facial nerve dissection/preservation on 11/16/14 with Dr. Benjamine Mola. Margins were technically negative, but extremely close.  She sought 2nd opinion from [facility]; returned to [facility] for adjuvant treatment.  Received adjuvant radiation therapy with Dr. Isidore Moos, which she completed from 12/30/14-02/09/15.  Pt was previously followed by NP Renato Battles and was not recommended for surveillance imaging.    She is complaining of worsening right neck pain near side of prior surgery.  Pt will be set up for CT neck and chest and if negative, she will RTC in 6 months for follow-up.    2.  Mediastinal mass:  -Diagnosed in 2016, initially concerning for malignancy. Underwent mediastinal mass resection on 10/01/14 with pathology revealing benign goiter.   - CT chest on 12/15/15 showed no evidence of metastatic disease in the chest. Several pulmonary nodules noted stability for 15 months at that time and considered benign.  Pt is set up for CT chest for follow-up based on pulmonary nodules and neck pain.   3.  HTN.  BP is 151/86.  Follow-up with PCP.    25 minutes spent with more than 50% spent in counseling and coordination of care.    Current Status: Pt is seen today for follow-up.  She is complaining of right neck pain near site of prior surgery.     Malignant tumor of parotid gland ([place])   08/24/2014 Imaging    CT neck: 19  mm mass in superficial right parotid consistent with neoplasm.  Partially visualized anterior mediastinal mass, at least 7 cm; appearance suggests thyroid nodule, but has no thyroid continuity.     08/27/2014 Imaging    CT chest: 9.6 x 3.8 x 6.1 cm lobulated, enhancing anterior mediastinal mass.     10/01/2014 Surgery    Resection of mediastinal mass/sternotomy Servando Snare): No malignancy; benign thyroid tissue.    11/16/2014 Initial Diagnosis    Malignant tumor of parotid gland ([place])    11/16/2014 Surgery    Right parotidectomy with facial nerve dissection & preservation ([place]): Adenocarcinoma, intermediate to focally high grade, nonspecific intercalated duct type. Tumor measures 1.8 cm. No perineural/angiolymphatic invasion. Negative margins.     11/16/2014 Pathologic Stage    pT1, pNx    12/07/2014 Imaging    PET scan: No evidence of metastatic disease or suspecious metabolic activity. Interval resection of right parotid tumor without suspicious residual activity. Interval resection of anterior mediastinal mass. Stable 52mm LUL lung nodule.     12/09/2014 Miscellaneous    No recommendation for adjuvant chemotherapy ([place]).     12/30/2014 - 02/09/2015 Radiation Therapy    Completed adjuvant IMRT Isidore Moos). Parotid bed, 60 Gy.  No nodal echelon treatment.       Problem List Patient Active Problem List   Diagnosis Date Noted  . Fecal incontinence [R15.9] 06/21/2017  . Pulmonary nodule, left [R91.1] 03/15/2015  . Malignant tumor of parotid gland ([place]) [C07] 12/09/2014  . H/O superficial parotidectomy [Z98.890] 11/16/2014  . 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  Irritable bowel syndrome [K58.9] 06/30/2012  . GERD (gastroesophageal reflux disease) [K21.9] 12/05/2011  . Lumbar spondylosis [M47.816] 07/09/2011  . 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 Disorders of sacrum [M53.3] 05/15/2011  . Paroxysmal atrial fibrillation (HCC)  [I48.0]   . DJD (degenerative joint disease) [M19.90]   . Low TSH level [R79.89] 01/17/2010  . Hypertension [I10] 07/27/2009  . PLANTAR FACIITIS [M72.2] 07/22/2007    Past Medical History Past Medical History:  Diagnosis Date  . 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 HTN (hypertension)   . Low TSH level 01/2010  . Neuromuscular disorder ([place])   . Paroxysmal atrial fibrillation (HCC)    normal echiocardiogram and stress nuclear; low TSH with normal T3 and T4   . PONV (postoperative nausea and vomiting)     Past Surgical History Past Surgical History:  Procedure Laterality Date  . ABDOMINAL HYSTERECTOMY     w/o oophorectomy  . APPENDECTOMY    . BUNIONECTOMY     bilateral  . CHOLECYSTECTOMY    . colonoscopy  03/09/15  . COLONOSCOPY N/A 03/09/2015   Procedure: COLONOSCOPY;  Surgeon: Rogene Houston, MD;  [facility];  Service: Endoscopy;  Laterality: N/A;  830  . ESOPHAGOGASTRODUODENOSCOPY  12/27/2011   Procedure: ESOPHAGOGASTRODUODENOSCOPY (EGD);  Surgeon: Rogene Houston, MD;  [facility];  Service: Endoscopy;  Laterality: N/A;  250  . HEMORRHOID SURGERY N/A 06/20/2015   Procedure: HEMORRHOIDECTOMY;  Surgeon: Aviva Signs, MD;  Location: [facility];  Service: General;  Laterality: N/A;  . PAROTIDECTOMY Right 11/16/2014   Procedure: RIGHT PAROTIDECTOMY;  Surgeon: Leta Baptist, MD;  Location: [place];  Service: ENT;  Laterality: Right;  . RESECTION OF MEDIASTINAL MASS N/A 10/01/2014   Procedure: RESECTION OF MEDIASTINAL MASS;  Surgeon: Grace Isaac, MD;  Location: [place];  Service: Thoracic;  Laterality: N/A;  . STERNOTOMY N/A 10/01/2014   Procedure: STERNOTOMY;  Surgeon: Grace Isaac, MD;  Location: [place];  Service: Thoracic;  Laterality: N/A;  . TONSILLECTOMY    . TONSILLECTOMY    . VEIN LIGATION AND STRIPPING      Family History Family History  Problem Relation Age of Onset  . Heart  disease Father   . Hypertension Father   . Diabetes Mother   . Hypertension Mother      Social History  reports that she has never smoked. She has never used smokeless tobacco. She reports that she drinks alcohol. She reports that she does not use drugs.  Medications  Current Outpatient Medications:  .  lisinopril-hydrochlorothiazide (PRINZIDE,ZESTORETIC) 20-12.5 MG tablet, TAKE 1 TABLET BY MOUTH EVERY MORNING FOR BLOOD PRESSURE, Disp: , Rfl:  .  aspirin EC 81 MG tablet, Take 81 mg by mouth daily., Disp: , Rfl:  .  ipratropium (ATROVENT) 0.06 % nasal spray, Place 2 sprays into both nostrils 2 (two) times daily., Disp: , Rfl:  .  levothyroxine (SYNTHROID, LEVOTHROID) 50 MCG tablet, TAKE 1 TABLET BY MOUTH EVERY DAY ON EMPTY STOMACH, Disp: , Rfl: 1 .  lisinopril-hydrochlorothiazide (PRINZIDE,ZESTORETIC) 10-12.5 MG per tablet, Take 1 tablet by mouth daily. (Patient not taking: Reported on 01/01/2018), Disp: 90 tablet, Rfl: 1 .  metoprolol tartrate (LOPRESSOR) 25 MG tablet, Take 1 tablet (25 mg total) by mouth 2 (two) times daily., Disp: 180 tablet, Rfl: 1 .  Multiple Vitamins-Minerals (HAIR/SKIN/NAILS PO), Take 1 tablet by mouth every morning. , Disp: ,  Rfl:  .  Omega-3 Fatty Acids (OMEGA-3 2100 PO), Take 1 capsule by mouth every morning. , Disp: , Rfl:  .  omeprazole (PRILOSEC) 20 MG capsule, Take 20 mg by mouth 2 (two) times daily before a meal. , Disp: , Rfl: 3 .  potassium chloride (K-DUR) 10 MEQ tablet, Take 10 mEq by mouth daily., Disp: , Rfl:  .  ranitidine (ZANTAC) 150 MG tablet, Take 150 mg by mouth at bedtime., Disp: , Rfl:  .  zinc gluconate 50 MG tablet, Take 50 mg by mouth daily.  , Disp: , Rfl:   Allergies Penicillins and Sulfonamide derivatives  Review of Systems Review of Systems - Oncology ROS as per HPI otherwise 12 point ROS is negative.   Physical Exam  Vitals Wt Readings from Last 3 Encounters:  01/01/18 160 lb (72.6 kg)  07/03/17 162 lb 11.2 oz (73.8 kg)   06/21/17 163 lb 12.8 oz (74.3 kg)   Temp Readings from Last 3 Encounters:  01/01/18 97.9 F (36.6 C) (Oral)  07/03/17 97.7 F (36.5 C) (Oral)  06/21/17 (!) 97.4 F (36.3 C)   BP Readings from Last 3 Encounters:  01/01/18 (!) 142/76  07/03/17 136/70  06/21/17 120/68   Pulse Readings from Last 3 Encounters:  01/01/18 73  07/03/17 71  06/21/17 64   Constitutional: Well-developed, well-nourished, and in no distress.   HENT: Head: Normocephalic and atraumatic.  Mouth/Throat: No oropharyngeal exudate. Mucosa moist. Eyes: Pupils are equal, round, and reactive to light. Conjunctivae are normal. No scleral icterus.  Neck: Normal range of motion. Neck supple. No JVD present.  Surgical changes noted in right neck. Pt reports tenderness.   Cardiovascular: Normal rate, regular rhythm and normal heart sounds.  Exam reveals no gallop and no friction rub.   No murmur heard. Pulmonary/Chest: Effort normal and breath sounds normal. No respiratory distress. No wheezes.No rales.  Abdominal: Soft. Bowel sounds are normal. No distension. There is no tenderness. There is no guarding.  Musculoskeletal: No edema or tenderness.  Lymphadenopathy: No cervical, axillary or supraclavicular adenopathy.  Neurological: Alert and oriented to person, place, and time. No cranial nerve deficit.  Skin: Skin is warm and dry. No rash noted. No erythema. No pallor.  Psychiatric: Affect and judgment normal.   Labs Appointment on 01/01/2018  Component Date Value Ref Range Status  . WBC 01/01/2018 5.9  4.0 - 10.5 K/uL Final  . RBC 01/01/2018 4.23  3.87 - 5.11 MIL/uL Final  . Hemoglobin 01/01/2018 13.4  12.0 - 15.0 g/dL Final  . HCT 01/01/2018 39.9  36.0 - 46.0 % Final  . MCV 01/01/2018 94.3  80.0 - 100.0 fL Final  . MCH 01/01/2018 31.7  26.0 - 34.0 pg Final  . MCHC 01/01/2018 33.6  30.0 - 36.0 g/dL Final  . RDW 01/01/2018 12.4  11.5 - 15.5 % Final  . Platelets 01/01/2018 203  150 - 400 K/uL Final  . nRBC  01/01/2018 0.0  0.0 - 0.2 % Final  . Neutrophils Relative % 01/01/2018 59  % Final  . Neutro Abs 01/01/2018 3.5  1.7 - 7.7 K/uL Final  . Lymphocytes Relative 01/01/2018 30  % Final  . Lymphs Abs 01/01/2018 1.8  0.7 - 4.0 K/uL Final  . Monocytes Relative 01/01/2018 9  % Final  . Monocytes Absolute 01/01/2018 0.5  0.1 - 1.0 K/uL Final  . Eosinophils Relative 01/01/2018 1  % Final  . Eosinophils Absolute 01/01/2018 0.0  0.0 - 0.5 K/uL Final  . Basophils Relative  01/01/2018 1  % Final  . Basophils Absolute 01/01/2018 0.1  0.0 - 0.1 K/uL Final  . Immature Granulocytes 01/01/2018 0  % Final  . Abs Immature Granulocytes 01/01/2018 0.01  0.00 - 0.07 K/uL Final   Performed at [facility], [address], [place], [place] [address]     Pathology Orders Placed This Encounter  Procedures  . CT SOFT TISSUE NECK W CONTRAST    Standing Status:   Future    Standing Expiration Date:   04/04/2019    Order Specific Question:   ** REASON FOR EXAM (FREE TEXT)    Answer:   neck pain    Order Specific Question:   If indicated for the ordered procedure, I authorize the administration of contrast media per Radiology protocol    Answer:   Yes    Order Specific Question:   Preferred imaging location?    Answer:   [facility]    Order Specific Question:   Radiology Contrast Protocol - do NOT remove file path    Answer:   \\[email or web address]  . CT CHEST W CONTRAST    Standing Status:   Future    Standing Expiration Date:   01/01/2019    Order Specific Question:   If indicated for the ordered procedure, I authorize the administration of contrast media per Radiology protocol    Answer:   Yes    Order Specific Question:   Preferred imaging location?    Answer:   [facility]    Order Specific Question:   Radiology Contrast Protocol - do NOT remove file path    Answer:   \\[email or web address]  . CBC with Differential/Platelet    Standing Status:    Future    Number of Occurrences:   1    Standing Expiration Date:   01/02/2019  . Comprehensive metabolic panel    Standing Status:   Future    Number of Occurrences:   1    Standing Expiration Date:   01/02/2019  . Lactate dehydrogenase    Standing Status:   Future    Number of Occurrences:   1    Standing Expiration Date:   01/02/2019       Zoila Shutter MD

## 2018-01-21 ENCOUNTER — Ambulatory Visit (HOSPITAL_COMMUNITY)
Admission: RE | Admit: 2018-01-21 | Discharge: 2018-01-21 | Disposition: A | Discharge status: Home / Self Care | Payer: Medicare HMO | Source: Ambulatory Visit | Attending: Internal Medicine | Admitting: Internal Medicine

## 2018-01-21 DIAGNOSIS — R918 Other nonspecific abnormal finding of lung field: Secondary | ICD-10-CM | POA: Diagnosis not present

## 2018-01-21 DIAGNOSIS — C07 Malignant neoplasm of parotid gland: Secondary | ICD-10-CM | POA: Diagnosis not present

## 2018-01-21 DIAGNOSIS — M542 Cervicalgia: Secondary | ICD-10-CM | POA: Insufficient documentation

## 2018-01-21 DIAGNOSIS — Z9049 Acquired absence of other specified parts of digestive tract: Secondary | ICD-10-CM | POA: Insufficient documentation

## 2018-01-21 MED ORDER — IOPAMIDOL (ISOVUE-300) INJECTION 61%
150.0000 mL | Freq: Once | INTRAVENOUS | Status: AC | PRN
  Administered 2018-01-21: 125 mL via INTRAVENOUS

## 2018-01-24 ENCOUNTER — Other Ambulatory Visit (HOSPITAL_COMMUNITY): Payer: Self-pay | Admitting: Internal Medicine

## 2018-01-24 DIAGNOSIS — C07 Malignant neoplasm of parotid gland: Secondary | ICD-10-CM

## 2018-01-24 DIAGNOSIS — R918 Other nonspecific abnormal finding of lung field: Secondary | ICD-10-CM

## 2018-02-04 ENCOUNTER — Other Ambulatory Visit (HOSPITAL_COMMUNITY): Payer: Self-pay | Admitting: Family Medicine

## 2018-02-04 DIAGNOSIS — Z1231 Encounter for screening mammogram for malignant neoplasm of breast: Secondary | ICD-10-CM

## 2018-03-13 ENCOUNTER — Ambulatory Visit (HOSPITAL_COMMUNITY)
Admission: RE | Admit: 2018-03-13 | Discharge: 2018-03-13 | Disposition: A | Discharge status: Home / Self Care | Payer: Medicare HMO | Source: Ambulatory Visit | Attending: Family Medicine | Admitting: Family Medicine

## 2018-03-13 DIAGNOSIS — Z1231 Encounter for screening mammogram for malignant neoplasm of breast: Secondary | ICD-10-CM | POA: Diagnosis not present

## 2018-03-14 ENCOUNTER — Ambulatory Visit (HOSPITAL_COMMUNITY): Payer: Medicare HMO

## 2018-03-23 ENCOUNTER — Emergency Department (HOSPITAL_COMMUNITY): Payer: Medicare HMO

## 2018-03-23 ENCOUNTER — Encounter (HOSPITAL_COMMUNITY): Payer: Self-pay

## 2018-03-23 ENCOUNTER — Emergency Department (HOSPITAL_COMMUNITY)
Admission: EM | Admit: 2018-03-23 | Discharge: 2018-03-23 | Disposition: A | Discharge status: Home / Self Care | Payer: Medicare HMO | Attending: Emergency Medicine | Admitting: Emergency Medicine

## 2018-03-23 ENCOUNTER — Other Ambulatory Visit: Payer: Self-pay

## 2018-03-23 DIAGNOSIS — Z79899 Other long term (current) drug therapy: Secondary | ICD-10-CM | POA: Insufficient documentation

## 2018-03-23 DIAGNOSIS — I10 Essential (primary) hypertension: Secondary | ICD-10-CM | POA: Insufficient documentation

## 2018-03-23 DIAGNOSIS — I480 Paroxysmal atrial fibrillation: Secondary | ICD-10-CM | POA: Insufficient documentation

## 2018-03-23 DIAGNOSIS — R079 Chest pain, unspecified: Secondary | ICD-10-CM | POA: Diagnosis not present

## 2018-03-23 DIAGNOSIS — Z7982 Long term (current) use of aspirin: Secondary | ICD-10-CM | POA: Insufficient documentation

## 2018-03-23 DIAGNOSIS — E871 Hypo-osmolality and hyponatremia: Secondary | ICD-10-CM | POA: Diagnosis not present

## 2018-03-23 LAB — CBC
HCT: 38.9 % (ref 36.0–46.0)
Hemoglobin: 13.1 g/dL (ref 12.0–15.0)
MCH: 31.1 pg (ref 26.0–34.0)
MCHC: 33.7 g/dL (ref 30.0–36.0)
MCV: 92.4 fL (ref 80.0–100.0)
PLATELETS: 191 10*3/uL (ref 150–400)
RBC: 4.21 MIL/uL (ref 3.87–5.11)
RDW: 12.7 % (ref 11.5–15.5)
WBC: 6.1 10*3/uL (ref 4.0–10.5)
nRBC: 0 % (ref 0.0–0.2)

## 2018-03-23 LAB — BASIC METABOLIC PANEL
Anion gap: 7 (ref 5–15)
BUN: 14 mg/dL (ref 8–23)
CO2: 24 mmol/L (ref 22–32)
Calcium: 8.8 mg/dL — ABNORMAL LOW (ref 8.9–10.3)
Chloride: 100 mmol/L (ref 98–111)
Creatinine, Ser: 0.67 mg/dL (ref 0.44–1.00)
GFR calc Af Amer: >60 mL/min (ref >60)
GFR calc non Af Amer: >60 mL/min (ref >60)
Glucose, Bld: 108 mg/dL — ABNORMAL HIGH (ref 70–99)
Potassium: 3.5 mmol/L (ref 3.5–5.1)
SODIUM: 131 mmol/L — AB (ref 135–145)

## 2018-03-23 LAB — I-STAT TROPONIN, ED
Troponin i, poc: 0 ng/mL (ref 0.00–0.08)
Troponin i, poc: 0 ng/mL (ref 0.00–0.08)

## 2018-03-23 MED ORDER — SODIUM CHLORIDE 0.9 % IV BOLUS
500.0000 mL | Freq: Once | INTRAVENOUS | Status: AC
  Administered 2018-03-23: 500 mL via INTRAVENOUS

## 2018-03-23 NOTE — ED Provider Notes (Signed)
[facility] EMERGENCY DEPARTMENT Provider Note   CSN: [id number] Arrival date & time: 03/23/18  1046     History   Chief Complaint Chief Complaint  Patient presents with  . Chest Pain    HPI [name] is a 78 y.o. female.  HPI Patient presents with dull chest pain.  States she woke up with it this morning laying in bed.  She states she was laying on her front.  It was dull in her midsternal area going to her left arm.  Has not had episodes like this before today.  Lasted a few minutes and went away.  Slight nausea after.  No fevers.  No cough.  Also states she has had a dull headache for last 3 days.  History of paroxysmal A. fib.  No known history of coronary artery disease. Past Medical History:  Diagnosis Date  . 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 HTN (hypertension)   . Low TSH level 01/2010  . Neuromuscular disorder ([place])   . Paroxysmal atrial fibrillation (HCC)    normal echiocardiogram and stress nuclear; low TSH with normal T3 and T4   . PONV (postoperative nausea and vomiting)     Patient Active Problem List   Diagnosis Date Noted  . Fecal incontinence 06/21/2017  . Pulmonary nodule, left 03/15/2015  . Malignant tumor of parotid gland ([place]) 12/09/2014  . H/O superficial parotidectomy 11/16/2014  . 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 Irritable bowel syndrome 06/30/2012  . GERD (gastroesophageal reflux disease) 12/05/2011  . Lumbar spondylosis 07/09/2011  . 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 Disorders of sacrum 05/15/2011  . Paroxysmal atrial fibrillation (HCC)   . DJD (degenerative joint disease)   . Low TSH level 01/17/2010  . Hypertension 07/27/2009  . PLANTAR FACIITIS 07/22/2007    Past Surgical History:  Procedure Laterality Date  . ABDOMINAL HYSTERECTOMY     w/o oophorectomy  . APPENDECTOMY    . BUNIONECTOMY     bilateral  . CHOLECYSTECTOMY      . colonoscopy  03/09/15  . COLONOSCOPY N/A 03/09/2015   Procedure: COLONOSCOPY;  Surgeon: Rogene Houston, MD;  [facility];  Service: Endoscopy;  Laterality: N/A;  830  . ESOPHAGOGASTRODUODENOSCOPY  12/27/2011   Procedure: ESOPHAGOGASTRODUODENOSCOPY (EGD);  Surgeon: Rogene Houston, MD;  [facility];  Service: Endoscopy;  Laterality: N/A;  250  . HEMORRHOID SURGERY N/A 06/20/2015   Procedure: HEMORRHOIDECTOMY;  Surgeon: Aviva Signs, MD;  Location: [facility];  Service: General;  Laterality: N/A;  . PAROTIDECTOMY Right 11/16/2014   Procedure: RIGHT PAROTIDECTOMY;  Surgeon: Leta Baptist, MD;  Location: [place];  Service: ENT;  Laterality: Right;  . RESECTION OF MEDIASTINAL MASS N/A 10/01/2014   Procedure: RESECTION OF MEDIASTINAL MASS;  Surgeon: Grace Isaac, MD;  Location: [place];  Service: Thoracic;  Laterality: N/A;  . STERNOTOMY N/A 10/01/2014   Procedure: STERNOTOMY;  Surgeon: Grace Isaac, MD;  Location: [place];  Service: Thoracic;  Laterality: N/A;  . TONSILLECTOMY    . TONSILLECTOMY    . VEIN LIGATION AND STRIPPING       OB History   No obstetric history on file.      Home Medications    Prior to Admission medications   Medication Sig Start Date End Date Taking? Authorizing Provider  aspirin EC 81 MG tablet Take 81 mg by mouth daily.  Yes [provider]  ipratropium (ATROVENT) 0.06 % nasal spray Place 2 sprays into both nostrils 2 (two) times daily.   Yes [provider]  levothyroxine (SYNTHROID, LEVOTHROID) 50 MCG tablet TAKE 1 TABLET BY MOUTH EVERY DAY ON EMPTY STOMACH 11/05/17  Yes [provider]  lisinopril-hydrochlorothiazide (PRINZIDE,ZESTORETIC) 20-12.5 MG tablet TAKE 1 TABLET BY MOUTH EVERY MORNING FOR BLOOD PRESSURE 10/21/17  Yes [provider]  metoprolol tartrate (LOPRESSOR) 25 MG tablet Take 1 tablet (25 mg total) by mouth 2 (two) times daily. 01/05/14  Yes Lendon Colonel, NP   montelukast (SINGULAIR) 10 MG tablet Take 1 tablet by mouth at bedtime. 03/18/18  Yes [provider]  Multiple Vitamins-Minerals (HAIR/SKIN/NAILS PO) Take 1 tablet by mouth every morning.    Yes [provider]  Omega-3 Fatty Acids (OMEGA-3 2100 PO) Take 1 capsule by mouth every morning.    Yes [provider]  omeprazole (PRILOSEC) 20 MG capsule Take 20 mg by mouth 2 (two) times daily before a meal.  11/13/16  Yes [provider]  potassium chloride (K-DUR) 10 MEQ tablet Take 10 mEq by mouth 2 (two) times daily.    Yes [provider]  ranitidine (ZANTAC) 150 MG tablet Take 150 mg by mouth at bedtime.   Yes [provider]  zinc gluconate 50 MG tablet Take 50 mg by mouth daily.     Yes [provider]    Family History Family History  Problem Relation Age of Onset  . Heart disease Father   . Hypertension Father   . Diabetes Mother   . Hypertension Mother     Social History Social History   Tobacco Use  . Smoking status: Never Smoker  . Smokeless tobacco: Never Used  . Tobacco comment: does not smoke  Substance Use Topics  . Alcohol use: Yes    Alcohol/week: 0.0 standard drinks    Comment: 1 glass of wine at a time just socially  . Drug use: No     Allergies   Penicillins and Sulfonamide derivatives   Review of Systems Review of Systems  Constitutional: Negative for chills and fever.  HENT: Negative for congestion.   Cardiovascular: Positive for chest pain.  Gastrointestinal: Negative for abdominal pain.  Genitourinary: Negative for flank pain.  Musculoskeletal: Negative for back pain.  Skin: Negative for rash.  Neurological: Positive for headaches. Negative for weakness.  Hematological: Negative for adenopathy.  Psychiatric/Behavioral: Negative for behavioral problems.     Physical Exam Updated Vital Signs BP (!) 208/92   Pulse 73   Temp 98.2 F (36.8 C) (Oral)   Resp 16   Ht 5\' 2"  (1.575 m)    Wt 71.2 kg   SpO2 100%   BMI 28.72 kg/m   Physical Exam HENT:     Head: Normocephalic.  Neck:     Musculoskeletal: Neck supple.  Cardiovascular:     Rate and Rhythm: Normal rate and regular rhythm.     Heart sounds: Normal heart sounds.  Chest:     Chest wall: No tenderness.  Abdominal:     Tenderness: There is no abdominal tenderness.  Musculoskeletal: Normal range of motion.  Skin:    General: Skin is warm.     Capillary Refill: Capillary refill takes less than 2 seconds.  Neurological:     General: No focal deficit present.     Mental Status: She is alert.      ED Treatments / Results  Labs (all labs ordered  are listed, but only abnormal results are displayed) Labs Reviewed  BASIC METABOLIC PANEL - Abnormal; Notable for the following components:      Result Value   Sodium 131 (*)    Glucose, Bld 108 (*)    Calcium 8.8 (*)    All other components within normal limits  CBC  I-STAT TROPONIN, ED  I-STAT TROPONIN, ED    EKG EKG Interpretation  Date/Time:  Sunday March 23 2018 10:58:53 EST Ventricular Rate:  72 PR Interval:    QRS Duration: 114 QT Interval:  379 QTC Calculation: 415 R Axis:   -55 Text Interpretation:  Sinus rhythm Incomplete left bundle branch block LVH with secondary repolarization abnormality Anterior Q waves, possibly due to LVH No significant change since last tracing Confirmed by Davonna Belling [phone]) on 03/23/2018 11:06:49 AM   Radiology Dg Chest 2 View  Result Date: 03/23/2018 CLINICAL DATA:  Onset chest and left arm pain this morning. EXAM: CHEST - 2 VIEW COMPARISON:  CT chest 01/21/2018.  PA and lateral chest 11/19/2016. FINDINGS: The lungs are clear. Heart size is normal. No pneumothorax or pleural effusion. No acute or focal bony abnormality. IMPRESSION: No acute disease. Electronically Signed   By: Inge Rise M.D.   On: 03/23/2018 11:48    Procedures Procedures (including critical care time)  Medications Ordered in  ED Medications  sodium chloride 0.9 % bolus 500 mL (0 mLs Intravenous Stopped 03/23/18 1418)     Initial Impression / Assessment and Plan / ED Course  I have reviewed the triage vital signs and the nursing notes.  Pertinent labs & imaging results that were available during my care of the patient were reviewed by me and considered in my medical decision making (see chart for details).     Patient episode of chest pain.  Resolved.  EKG reassuring.  Enzymes negative x2.  I think she is overall low risk.  We will discharged home.  Patient is had hypertension but is not had her medicines today.  Mild hyponatremia.  Follow-up as an outpatient.  Final Clinical Impressions(s) / ED Diagnoses   Final diagnoses:  Nonspecific chest pain  Hyponatremia    ED Discharge Orders    None       Davonna Belling, MD 03/23/18 1429

## 2018-03-23 NOTE — ED Triage Notes (Signed)
Pt reports got up around 6am to use the bathroom.  Reports she came back to bed and laid on her stomach and noticed a pain in her chest, numbness to left arm, and slight brief pain in left side of neck.  Pt says those symptoms have resolved but stomach seems "unsettled."  LBM was this morning.  Denies any n/v/d, or sob.  Reports headache.

## 2018-06-02 ENCOUNTER — Ambulatory Visit (INDEPENDENT_AMBULATORY_CARE_PROVIDER_SITE_OTHER): Payer: Medicare HMO | Admitting: Otolaryngology

## 2018-06-02 DIAGNOSIS — K219 Gastro-esophageal reflux disease without esophagitis: Secondary | ICD-10-CM | POA: Diagnosis not present

## 2018-06-02 DIAGNOSIS — R490 Dysphonia: Secondary | ICD-10-CM

## 2018-06-26 ENCOUNTER — Other Ambulatory Visit (HOSPITAL_COMMUNITY): Payer: Medicare HMO

## 2018-07-03 ENCOUNTER — Ambulatory Visit (HOSPITAL_COMMUNITY): Payer: Medicare HMO | Admitting: Hematology

## 2018-07-17 ENCOUNTER — Other Ambulatory Visit (HOSPITAL_COMMUNITY): Payer: Self-pay

## 2018-07-17 DIAGNOSIS — C07 Malignant neoplasm of parotid gland: Secondary | ICD-10-CM

## 2018-07-18 ENCOUNTER — Inpatient Hospital Stay (HOSPITAL_COMMUNITY): Payer: Medicare HMO | Attending: Hematology

## 2018-07-18 ENCOUNTER — Other Ambulatory Visit: Payer: Self-pay

## 2018-07-18 ENCOUNTER — Ambulatory Visit (HOSPITAL_COMMUNITY)
Admission: RE | Admit: 2018-07-18 | Discharge: 2018-07-18 | Disposition: A | Discharge status: Home / Self Care | Payer: Medicare HMO | Source: Ambulatory Visit | Attending: Internal Medicine | Admitting: Internal Medicine

## 2018-07-18 DIAGNOSIS — C07 Malignant neoplasm of parotid gland: Secondary | ICD-10-CM | POA: Diagnosis not present

## 2018-07-18 DIAGNOSIS — Z7982 Long term (current) use of aspirin: Secondary | ICD-10-CM | POA: Diagnosis not present

## 2018-07-18 DIAGNOSIS — Z923 Personal history of irradiation: Secondary | ICD-10-CM | POA: Diagnosis not present

## 2018-07-18 DIAGNOSIS — R918 Other nonspecific abnormal finding of lung field: Secondary | ICD-10-CM | POA: Diagnosis present

## 2018-07-18 DIAGNOSIS — I480 Paroxysmal atrial fibrillation: Secondary | ICD-10-CM | POA: Insufficient documentation

## 2018-07-18 DIAGNOSIS — Z79899 Other long term (current) drug therapy: Secondary | ICD-10-CM | POA: Insufficient documentation

## 2018-07-18 DIAGNOSIS — I10 Essential (primary) hypertension: Secondary | ICD-10-CM | POA: Insufficient documentation

## 2018-07-18 LAB — CBC WITH DIFFERENTIAL/PLATELET
Abs Immature Granulocytes: 0.02 10*3/uL (ref 0.00–0.07)
Basophils Absolute: 0.1 10*3/uL (ref 0.0–0.1)
Basophils Relative: 1 %
Eosinophils Absolute: 0 10*3/uL (ref 0.0–0.5)
Eosinophils Relative: 1 %
HCT: 40.6 % (ref 36.0–46.0)
Hemoglobin: 13.6 g/dL (ref 12.0–15.0)
Immature Granulocytes: 0 %
Lymphocytes Relative: 21 %
Lymphs Abs: 1.3 10*3/uL (ref 0.7–4.0)
MCH: 31.6 pg (ref 26.0–34.0)
MCHC: 33.5 g/dL (ref 30.0–36.0)
MCV: 94.2 fL (ref 80.0–100.0)
Monocytes Absolute: 0.5 10*3/uL (ref 0.1–1.0)
Monocytes Relative: 8 %
Neutro Abs: 4.5 10*3/uL (ref 1.7–7.7)
Neutrophils Relative %: 69 %
Platelets: 172 10*3/uL (ref 150–400)
RBC: 4.31 MIL/uL (ref 3.87–5.11)
RDW: 12.3 % (ref 11.5–15.5)
WBC: 6.4 10*3/uL (ref 4.0–10.5)
nRBC: 0 % (ref 0.0–0.2)

## 2018-07-18 LAB — COMPREHENSIVE METABOLIC PANEL
ALT: 22 U/L (ref 0–44)
AST: 20 U/L (ref 15–41)
Albumin: 3.9 g/dL (ref 3.5–5.0)
Alkaline Phosphatase: 65 U/L (ref 38–126)
Anion gap: 9 (ref 5–15)
BUN: 11 mg/dL (ref 8–23)
CO2: 23 mmol/L (ref 22–32)
Calcium: 9 mg/dL (ref 8.9–10.3)
Chloride: 105 mmol/L (ref 98–111)
Creatinine, Ser: 0.71 mg/dL (ref 0.44–1.00)
GFR calc Af Amer: >60 mL/min (ref >60)
GFR calc non Af Amer: >60 mL/min (ref >60)
Glucose, Bld: 114 mg/dL — ABNORMAL HIGH (ref 70–99)
Potassium: 4 mmol/L (ref 3.5–5.1)
Sodium: 137 mmol/L (ref 135–145)
Total Bilirubin: 0.5 mg/dL (ref 0.3–1.2)
Total Protein: 7.2 g/dL (ref 6.5–8.1)

## 2018-07-18 MED ORDER — IOHEXOL 300 MG/ML  SOLN
75.0000 mL | Freq: Once | INTRAMUSCULAR | Status: AC | PRN
  Administered 2018-07-18: 75 mL via INTRAVENOUS

## 2018-07-21 ENCOUNTER — Other Ambulatory Visit (HOSPITAL_COMMUNITY): Payer: Medicare HMO

## 2018-07-22 ENCOUNTER — Encounter (HOSPITAL_COMMUNITY): Payer: Self-pay | Admitting: Hematology

## 2018-07-22 ENCOUNTER — Inpatient Hospital Stay (HOSPITAL_BASED_OUTPATIENT_CLINIC_OR_DEPARTMENT_OTHER): Payer: Medicare HMO | Admitting: Hematology

## 2018-07-22 ENCOUNTER — Other Ambulatory Visit: Payer: Self-pay

## 2018-07-22 VITALS — BP 147/85 | HR 71 | Temp 97.9°F | Wt 159.0 lb

## 2018-07-22 DIAGNOSIS — Z923 Personal history of irradiation: Secondary | ICD-10-CM

## 2018-07-22 DIAGNOSIS — C07 Malignant neoplasm of parotid gland: Secondary | ICD-10-CM

## 2018-07-22 DIAGNOSIS — R918 Other nonspecific abnormal finding of lung field: Secondary | ICD-10-CM | POA: Diagnosis not present

## 2018-07-22 DIAGNOSIS — I10 Essential (primary) hypertension: Secondary | ICD-10-CM

## 2018-07-22 DIAGNOSIS — I480 Paroxysmal atrial fibrillation: Secondary | ICD-10-CM

## 2018-07-22 DIAGNOSIS — Z7982 Long term (current) use of aspirin: Secondary | ICD-10-CM

## 2018-07-22 DIAGNOSIS — Z79899 Other long term (current) drug therapy: Secondary | ICD-10-CM

## 2018-07-22 NOTE — Assessment & Plan Note (Signed)
1.  Right parotid adenocarcinoma: - Status post surgical resection in August 2016 followed by adjuvant radiation therapy 12/30/2014 through 02/09/2015. - CT of the soft tissue neck on 07/18/2018 shows postop parotidectomy on the right for tumor resection with no recurrent mass or adenopathy in the neck. - CT of the chest with contrast showed stable subcentimeter pulmonary nodules (5 mm nodule in the left upper lobe and 4 mm nodule in the central right lower lobe) consistent with benign postinflammatory etiology. -Physical examination today did not reveal any palpable masses.  She does not report any dry mouth. -We will see her back in 6 months for follow-up with repeat CT of the neck.

## 2018-07-22 NOTE — Progress Notes (Signed)
[facility] 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  Partially visualized anterior mediastinal mass, at least 7 cm; appearance suggests thyroid nodule, but has no thyroid continuity.     08/27/2014 Imaging    CT chest: 9.6 x 3.8 x 6.1 cm lobulated, enhancing anterior mediastinal mass.     10/01/2014 Surgery    Resection of mediastinal mass/sternotomy Servando Snare): No malignancy; benign thyroid tissue.    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 Tumor measures 1.8 cm. No perineural/angiolymphatic invasion. Negative margins.     11/16/2014 Pathologic Stage    pT1, pNx    12/07/2014 Imaging    PET scan: No evidence of metastatic disease or suspecious metabolic activity. Interval resection of right parotid tumor without suspicious residual activity. Interval resection of anterior mediastinal mass. Stable 37mm LUL lung nodule.     12/09/2014 Miscellaneous    No recommendation for adjuvant chemotherapy ([place]).     12/30/2014 - 02/09/2015 Radiation Therapy    Completed adjuvant IMRT Isidore Moos). Parotid bed, 60 Gy.  No nodal echelon treatment.       CANCER STAGING: Cancer Staging No matching staging information was found for the patient.   INTERVAL HISTORY:  [name] 78 y.o. female returns for routine follow-up. She is here today alone. Reports overall doing well.  Denies any nausea, vomiting, or  diarrhea. Denies any new pains. Had not noticed any recent bleeding such as epistaxis, hematuria or hematochezia. Denies recent chest pain on exertion, shortness of breath on minimal exertion, pre-syncopal episodes, or palpitations. Denies any numbness or tingling in hands or feet. Denies any recent fevers, infections, or recent hospitalizations. Patient reports appetite at 100% and energy level at 100%.    REVIEW OF SYSTEMS:  Review of Systems  Constitutional: Negative.   HENT:  Negative.   Eyes: Negative.   Respiratory: Negative.   Cardiovascular: Negative.   Gastrointestinal: Negative.   Endocrine: Negative.   Genitourinary: Negative.    Musculoskeletal: Negative.   Skin: Negative.   Neurological: Negative.   Hematological: Negative.   Psychiatric/Behavioral: Negative.      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   HTN (hypertension)    Low TSH level 01/2010   Neuromuscular disorder (HCC)    Paroxysmal atrial fibrillation (HCC)    normal echiocardiogram and stress nuclear; low TSH with normal T3 and T4    PONV (postoperative nausea and vomiting)    Past Surgical History:  Procedure Laterality Date   ABDOMINAL HYSTERECTOMY     w/o oophorectomy   APPENDECTOMY     BUNIONECTOMY     bilateral   CHOLECYSTECTOMY     colonoscopy  03/09/15   COLONOSCOPY N/A 03/09/2015   Procedure: COLONOSCOPY;  Surgeon: Rogene Houston, MD;  [facility];  Service: Endoscopy;  Laterality: N/A;  830   ESOPHAGOGASTRODUODENOSCOPY  12/27/2011   Procedure: ESOPHAGOGASTRODUODENOSCOPY (EGD);  Surgeon: Rogene Houston, MD;  [facility];  Service: Endoscopy;  Laterality: N/A;  250   HEMORRHOID SURGERY N/A 06/20/2015   Procedure: HEMORRHOIDECTOMY;  Surgeon: Aviva Signs, MD;  Location: [facility];  Service: General;  Laterality: N/A;   PAROTIDECTOMY Right  11/16/2014   Procedure: RIGHT PAROTIDECTOMY;  Surgeon: Leta Baptist, MD;  Location: [facility];  Service: ENT;  Laterality: Right;   RESECTION OF MEDIASTINAL MASS N/A 10/01/2014   Procedure: RESECTION OF MEDIASTINAL MASS;  Surgeon: Grace Isaac, MD;  Location: [facility];  Service: Thoracic;  Laterality: N/A;   STERNOTOMY N/A 10/01/2014   Procedure: STERNOTOMY;  Surgeon: Grace Isaac, MD;  Location: [facility];  Service: Thoracic;  Laterality: N/A;   TONSILLECTOMY     TONSILLECTOMY     VEIN LIGATION AND STRIPPING       SOCIAL HISTORY:  Social History   Socioeconomic History   Marital status: Married    Spouse name: Not on file   Number of children: Not on file   Years of education: Not on file   Highest education level: Not on file  Occupational History   Not on file  Social Needs   Financial resource strain: Not on file   Food insecurity:    Worry: Not on file    Inability: Not on file   Transportation needs:    Medical: Not on file    Non-medical: Not on file  Tobacco Use   Smoking status: Never Smoker   Smokeless tobacco: Never Used   Tobacco comment: does not smoke  Substance and Sexual Activity   Alcohol use: Yes    Alcohol/week: 0.0 standard drinks    Comment: 1 glass of wine at a time just socially   Drug use: No   Sexual activity: Not on file  Lifestyle   Physical activity:    Days per week: Not on file    Minutes per session: Not on file   Stress: Not on file  Relationships   Social connections:    Talks on phone: Not on file    Gets together: Not on file    Attends religious service: Not on file    Active member of club or organization: Not on file    Attends meetings of clubs or organizations: Not on file    Relationship status: Not on file   Intimate partner violence:    Fear of current or ex partner: Not on file    Emotionally abused: Not on file    Physically abused: Not on file    Forced sexual activity: Not on  file  Other Topics Concern   Not on file  Social History Narrative   Married, employment - Network engineer. No caffeine. 12 yr education     FAMILY HISTORY:  Family History  Problem Relation Age of Onset   Heart disease Father    Hypertension Father    Diabetes Mother    Hypertension Mother     CURRENT MEDICATIONS:  Outpatient Encounter Medications as of 07/22/2018  Medication Sig   levothyroxine (SYNTHROID) 50 MCG tablet TAKE 1 TABLET BY MOUTH EVERY DAY ON EMPTY STOMACH   lisinopril (ZESTRIL) 40 MG tablet Take by mouth.   aspirin EC 81 MG tablet Take 81 mg by mouth daily.   ipratropium (ATROVENT) 0.06 % nasal spray Place 2 sprays into both nostrils 2 (two) times daily.  levothyroxine (SYNTHROID, LEVOTHROID) 50 MCG tablet TAKE 1 TABLET BY MOUTH EVERY DAY ON EMPTY STOMACH   lisinopril (ZESTRIL) 40 MG tablet Take 40 mg by mouth daily.   lisinopril-hydrochlorothiazide (PRINZIDE,ZESTORETIC) 20-12.5 MG tablet TAKE 1 TABLET BY MOUTH EVERY MORNING FOR BLOOD PRESSURE   metoprolol tartrate (LOPRESSOR) 25 MG tablet Take 1 tablet (25 mg total) by mouth 2 (two) times daily.   montelukast (SINGULAIR) 10 MG tablet Take 1 tablet by mouth at bedtime.   Multiple Vitamins-Minerals (HAIR/SKIN/NAILS PO) Take 1 tablet by mouth every morning.    Omega-3 Fatty Acids (OMEGA-3 2100 PO) Take 1 capsule by mouth every morning.    omeprazole (PRILOSEC) 20 MG capsule Take 20 mg by mouth 2 (two) times daily before a meal.    potassium chloride (K-DUR) 10 MEQ tablet Take 10 mEq by mouth 2 (two) times daily.    zinc gluconate 50 MG tablet Take 50 mg by mouth daily.     [DISCONTINUED] ranitidine (ZANTAC) 150 MG tablet Take 150 mg by mouth at bedtime.   No facility-administered encounter medications on file as of 07/22/2018.     ALLERGIES:  Allergies  Allergen Reactions   Penicillins Other (See Comments)    Has patient had a PCN reaction causing immediate rash, facial/tongue/throat swelling, SOB  or lightheadedness with hypotension: No Has patient had a PCN reaction causing severe rash involving mucus membranes or skin necrosis: No Has patient had a PCN reaction that required hospitalization: No Has patient had a PCN reaction occurring within the last 10 years: No If all of the above answers are "NO", then may proceed with Cephalosporin use.    Unknown "weird tingling feeling in legs"   Sulfonamide Derivatives Other (See Comments)    Leg Pain     PHYSICAL EXAM:  ECOG Performance status: 1  Vitals:   07/22/18 0812 07/22/18 0816  BP: (!) 173/87 (!) 147/85  Pulse: 71   Temp: 97.9 F (36.6 C)   SpO2: 100%    Filed Weights   07/22/18 0812  Weight: 159 lb (72.1 kg)    Physical Exam Vitals signs reviewed.  Constitutional:      Appearance: Normal appearance.  Cardiovascular:     Rate and Rhythm: Normal rate and regular rhythm.     Heart sounds: Normal heart sounds.  Pulmonary:     Effort: Pulmonary effort is normal.     Breath sounds: Normal breath sounds.  Abdominal:     General: There is no distension.     Palpations: Abdomen is soft. There is no mass.  Musculoskeletal:        General: No swelling.  Lymphadenopathy:     Cervical: No cervical adenopathy.  Skin:    General: Skin is warm.  Neurological:     General: No focal deficit present.     Mental Status: She is alert and oriented to person, place, and time.  Psychiatric:        Mood and Affect: Mood normal.        Behavior: Behavior normal.      LABORATORY DATA:  I have reviewed the labs as listed.  CBC    Component Value Date/Time   WBC 6.4 07/18/2018 0947   RBC 4.31 07/18/2018 0947   HGB 13.6 07/18/2018 0947   HCT 40.6 07/18/2018 0947   PLT 172 07/18/2018 0947   MCV 94.2 07/18/2018 0947   MCH 31.6 07/18/2018 0947   MCHC 33.5 07/18/2018 0947   RDW 12.3 07/18/2018 0947   LYMPHSABS  1.3 07/18/2018 0947   MONOABS 0.5 07/18/2018 0947   EOSABS 0.0 07/18/2018 0947   BASOSABS 0.1 07/18/2018  0947   CMP Latest Ref Rng & Units 07/18/2018 03/23/2018 01/01/2018  Glucose 70 - 99 mg/dL 114(H) 108(H) 106(H)  BUN 8 - 23 mg/dL 11 14 14   Creatinine 0.44 - 1.00 mg/dL 0.71 0.67 0.93  Sodium 135 - 145 mmol/L 137 131(L) 133(L)  Potassium 3.5 - 5.1 mmol/L 4.0 3.5 4.0  Chloride 98 - 111 mmol/L 105 100 101  CO2 22 - 32 mmol/L 23 24 23   Calcium 8.9 - 10.3 mg/dL 9.0 8.8(L) 9.1  Total Protein 6.5 - 8.1 g/dL 7.2 - 7.7  Total Bilirubin 0.3 - 1.2 mg/dL 0.5 - 0.8  Alkaline Phos 38 - 126 U/L 65 - 57  AST 15 - 41 U/L 20 - 22  ALT 0 - 44 U/L 22 - 20       DIAGNOSTIC IMAGING:  I have independently reviewed the scans and discussed with the patient.   I have reviewed Venita Lick LPN's note and agree with the documentation.  I personally performed a face-to-face visit, made revisions and my assessment and plan is as follows.    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  Right parotid adenocarcinoma: - Status post surgical resection in August 2016 followed by adjuvant radiation therapy 12/30/2014 through 02/09/2015. - CT of the soft tissue neck on 07/18/2018 shows postop parotidectomy on the right for tumor resection with no recurrent mass or adenopathy in the neck. - CT of the chest with contrast showed stable subcentimeter pulmonary nodules (5 mm nodule in the left upper lobe and 4 mm nodule in the central right lower lobe) consistent with benign postinflammatory etiology. -Physical examination today did not reveal any palpable masses.  She does not report any dry mouth. -We will see her back in 6 months for follow-up with repeat CT of the neck.      Orders placed this encounter:  Orders Placed This Encounter  Procedures   CT SOFT TISSUE NECK W CONTRAST      Derek Jack, [place] [phone]

## 2018-07-22 NOTE — Patient Instructions (Addendum)
[place] at [facility] Discharge Instructions  You were seen today by Dr. Delton Coombes. He went over your recent lab and scan results. Scans were stable. No signs of malignancy!  He will see you back in 6 mths for repeat scans and follow up.   Thank you for choosing [place] at [facility] to provide your oncology and hematology care.  To afford each patient quality time with our provider, please arrive at least 15 minutes before your scheduled appointment time.   If you have a lab appointment with the [facility] please come in thru the  Main Entrance and check in at the main information desk  You need to re-schedule your appointment should you arrive 10 or more minutes late.  We strive to give you quality time with our providers, and arriving late affects you and other patients whose appointments are after yours.  Also, if you no show three or more times for appointments you may be dismissed from the clinic at the providers discretion.     Again, thank you for choosing [facility].  Our hope is that these requests will decrease the amount of time that you wait before being seen by our physicians.       _____________________________________________________________  Should you have questions after your visit to [facility], please contact our office at [phone] between the hours of 8:00 a.m. and 4:30 p.m.  Voicemails left after 4:00 p.m. will not be returned until the following business day.  For prescription refill requests, have your pharmacy contact our office and allow 72 hours.    Cancer Center Support Programs:   > Cancer Support Group  2nd Tuesday of the month 1pm-2pm, Journey Room

## 2018-12-08 ENCOUNTER — Ambulatory Visit (INDEPENDENT_AMBULATORY_CARE_PROVIDER_SITE_OTHER): Payer: Medicare HMO | Admitting: Otolaryngology

## 2018-12-08 DIAGNOSIS — K219 Gastro-esophageal reflux disease without esophagitis: Secondary | ICD-10-CM | POA: Diagnosis not present

## 2018-12-08 DIAGNOSIS — R490 Dysphonia: Secondary | ICD-10-CM

## 2019-01-21 ENCOUNTER — Other Ambulatory Visit (HOSPITAL_COMMUNITY): Payer: Self-pay | Admitting: *Deleted

## 2019-01-21 DIAGNOSIS — R918 Other nonspecific abnormal finding of lung field: Secondary | ICD-10-CM

## 2019-01-21 DIAGNOSIS — C07 Malignant neoplasm of parotid gland: Secondary | ICD-10-CM

## 2019-01-22 ENCOUNTER — Inpatient Hospital Stay (HOSPITAL_COMMUNITY): Payer: Medicare HMO | Attending: Hematology

## 2019-01-22 ENCOUNTER — Other Ambulatory Visit: Payer: Self-pay

## 2019-01-22 ENCOUNTER — Ambulatory Visit (HOSPITAL_COMMUNITY)
Admission: RE | Admit: 2019-01-22 | Discharge: 2019-01-22 | Disposition: A | Discharge status: Home / Self Care | Payer: Medicare HMO | Source: Ambulatory Visit | Attending: Hematology | Admitting: Hematology

## 2019-01-22 DIAGNOSIS — C07 Malignant neoplasm of parotid gland: Secondary | ICD-10-CM | POA: Diagnosis not present

## 2019-01-22 DIAGNOSIS — Z8589 Personal history of malignant neoplasm of other organs and systems: Secondary | ICD-10-CM | POA: Insufficient documentation

## 2019-01-22 DIAGNOSIS — R918 Other nonspecific abnormal finding of lung field: Secondary | ICD-10-CM

## 2019-01-22 LAB — CBC WITH DIFFERENTIAL/PLATELET
Abs Immature Granulocytes: 0.01 10*3/uL (ref 0.00–0.07)
Basophils Absolute: 0.1 10*3/uL (ref 0.0–0.1)
Basophils Relative: 1 %
Eosinophils Absolute: 0 10*3/uL (ref 0.0–0.5)
Eosinophils Relative: 1 %
HCT: 41.4 % (ref 36.0–46.0)
Hemoglobin: 13.4 g/dL (ref 12.0–15.0)
Immature Granulocytes: 0 %
Lymphocytes Relative: 25 %
Lymphs Abs: 1.3 10*3/uL (ref 0.7–4.0)
MCH: 30.2 pg (ref 26.0–34.0)
MCHC: 32.4 g/dL (ref 30.0–36.0)
MCV: 93.5 fL (ref 80.0–100.0)
Monocytes Absolute: 0.5 10*3/uL (ref 0.1–1.0)
Monocytes Relative: 9 %
Neutro Abs: 3.4 10*3/uL (ref 1.7–7.7)
Neutrophils Relative %: 64 %
Platelets: 158 10*3/uL (ref 150–400)
RBC: 4.43 MIL/uL (ref 3.87–5.11)
RDW: 12.9 % (ref 11.5–15.5)
WBC: 5.3 10*3/uL (ref 4.0–10.5)
nRBC: 0 % (ref 0.0–0.2)

## 2019-01-22 LAB — COMPREHENSIVE METABOLIC PANEL
ALT: 20 U/L (ref 0–44)
AST: 22 U/L (ref 15–41)
Albumin: 3.8 g/dL (ref 3.5–5.0)
Alkaline Phosphatase: 58 U/L (ref 38–126)
Anion gap: 10 (ref 5–15)
BUN: 14 mg/dL (ref 8–23)
CO2: 23 mmol/L (ref 22–32)
Calcium: 9 mg/dL (ref 8.9–10.3)
Chloride: 104 mmol/L (ref 98–111)
Creatinine, Ser: 0.69 mg/dL (ref 0.44–1.00)
GFR calc Af Amer: >60 mL/min (ref >60)
GFR calc non Af Amer: >60 mL/min (ref >60)
Glucose, Bld: 106 mg/dL — ABNORMAL HIGH (ref 70–99)
Potassium: 4.2 mmol/L (ref 3.5–5.1)
Sodium: 137 mmol/L (ref 135–145)
Total Bilirubin: 1 mg/dL (ref 0.3–1.2)
Total Protein: 7.3 g/dL (ref 6.5–8.1)

## 2019-01-22 LAB — POCT I-STAT CREATININE: Creatinine, Ser: 0.7 mg/dL (ref 0.44–1.00)

## 2019-01-22 MED ORDER — IOHEXOL 300 MG/ML  SOLN
75.0000 mL | Freq: Once | INTRAMUSCULAR | Status: AC | PRN
  Administered 2019-01-22: 75 mL via INTRAVENOUS

## 2019-01-27 ENCOUNTER — Inpatient Hospital Stay (HOSPITAL_COMMUNITY): Payer: Medicare HMO | Admitting: Hematology

## 2019-01-27 ENCOUNTER — Encounter (HOSPITAL_COMMUNITY): Payer: Self-pay | Admitting: Hematology

## 2019-01-27 ENCOUNTER — Other Ambulatory Visit: Payer: Self-pay

## 2019-01-27 VITALS — BP 168/90 | HR 69 | Temp 97.1°F | Resp 18 | Wt 153.9 lb

## 2019-01-27 DIAGNOSIS — Z8589 Personal history of malignant neoplasm of other organs and systems: Secondary | ICD-10-CM | POA: Diagnosis not present

## 2019-01-27 DIAGNOSIS — C07 Malignant neoplasm of parotid gland: Secondary | ICD-10-CM

## 2019-01-27 NOTE — Patient Instructions (Signed)
[place] at [facility] Discharge Instructions  You were seen today by Dr. Delton Coombes. He went over your recent scan results. He will see you back in 1 year for labs and follow up.   Thank you for choosing [place] at [facility] to provide your oncology and hematology care.  To afford each patient quality time with our provider, please arrive at least 15 minutes before your scheduled appointment time.   If you have a lab appointment with the [facility] please come in thru the  Main Entrance and check in at the main information desk  You need to re-schedule your appointment should you arrive 10 or more minutes late.  We strive to give you quality time with our providers, and arriving late affects you and other patients whose appointments are after yours.  Also, if you no show three or more times for appointments you may be dismissed from the clinic at the providers discretion.     Again, thank you for choosing [facility].  Our hope is that these requests will decrease the amount of time that you wait before being seen by our physicians.       _____________________________________________________________  Should you have questions after your visit to [facility], please contact our office at [phone] between the hours of 8:00 a.m. and 4:30 p.m.  Voicemails left after 4:00 p.m. will not be returned until the following business day.  For prescription refill requests, have your pharmacy contact our office and allow 72 hours.    Cancer Center Support Programs:   > Cancer Support Group  2nd Tuesday of the month 1pm-2pm, Journey Room

## 2019-01-27 NOTE — Progress Notes (Signed)
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  Partially visualized anterior mediastinal mass, at least 7 cm; appearance suggests thyroid nodule, but has no thyroid continuity.    08/27/2014 Imaging   CT chest: 9.6 x 3.8 x 6.1 cm lobulated, enhancing anterior mediastinal mass.    10/01/2014 Surgery   Resection of mediastinal mass/sternotomy Servando Snare): No malignancy; benign thyroid tissue.   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 Tumor measures 1.8 cm. No perineural/angiolymphatic invasion. Negative margins.    11/16/2014 Pathologic Stage   pT1, pNx   12/07/2014 Imaging   PET scan: No evidence of metastatic disease or suspecious metabolic activity. Interval resection of right parotid tumor without suspicious residual activity. Interval resection of anterior mediastinal mass. Stable 25mm LUL lung nodule.    12/09/2014 Miscellaneous   No recommendation for adjuvant chemotherapy ([place]).    12/30/2014 - 02/09/2015 Radiation Therapy   Completed adjuvant IMRT Isidore Moos). Parotid bed, 60 Gy.  No nodal echelon treatment.       CANCER STAGING: Cancer Staging No matching staging information was found for the patient.   INTERVAL HISTORY:  [name] 78 y.o. female seen for follow-up of right parotid adenocarcinoma.  Has minor tenderness at the right angle of the jaw.  Appetite and energy  levels are 100%.  No clear pains reported.  Denies any fevers, night sweats or weight loss in the last 6 months.  Denies any ER visits or hospitalizations.    REVIEW OF SYSTEMS:  Review of Systems  Constitutional: Negative.   HENT:  Negative.   Eyes: Negative.   Respiratory: Negative.   Cardiovascular: Negative.   Gastrointestinal: Negative.   Endocrine: Negative.   Genitourinary: Negative.    Musculoskeletal: Negative.   Skin: Negative.   Neurological: Negative.   Hematological: Negative.   Psychiatric/Behavioral: Negative.      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   HTN (hypertension)    Low TSH level 01/2010   Neuromuscular disorder (HCC)    Paroxysmal atrial fibrillation (HCC)    normal echiocardiogram and stress nuclear; low TSH with normal T3 and T4    PONV (postoperative nausea and vomiting)    Past Surgical History:  Procedure Laterality Date   ABDOMINAL HYSTERECTOMY     w/o oophorectomy   APPENDECTOMY     BUNIONECTOMY     bilateral   CHOLECYSTECTOMY     colonoscopy  03/09/15   COLONOSCOPY N/A 03/09/2015   Procedure: COLONOSCOPY;  Surgeon: Rogene Houston, MD;  [facility];  Service: Endoscopy;  Laterality: N/A;  830   ESOPHAGOGASTRODUODENOSCOPY  12/27/2011   Procedure: ESOPHAGOGASTRODUODENOSCOPY (EGD);  Surgeon: Rogene Houston, MD;  [facility];  Service: Endoscopy;  Laterality: N/A;  250   HEMORRHOID SURGERY N/A 06/20/2015   Procedure: HEMORRHOIDECTOMY;  Surgeon: Aviva Signs, MD;  Location: [facility];  Service: General;  Laterality: N/A;   PAROTIDECTOMY Right 11/16/2014   Procedure: RIGHT PAROTIDECTOMY;  Surgeon: Leta Baptist, MD;  Location: [facility];  Service: ENT;  Laterality: Right;   RESECTION OF MEDIASTINAL MASS N/A 10/01/2014   Procedure: RESECTION OF MEDIASTINAL MASS;  Surgeon: Grace Isaac, MD;  Location: [facility];  Service: Thoracic;  Laterality: N/A;   STERNOTOMY N/A 10/01/2014   Procedure: STERNOTOMY;  Surgeon: Grace Isaac, MD;  Location: [facility];  Service: Thoracic;  Laterality: N/A;   TONSILLECTOMY     TONSILLECTOMY     VEIN LIGATION AND STRIPPING       SOCIAL HISTORY:  Social History   Socioeconomic History   Marital status: Married    Spouse name: Not on file   Number of children: Not on file   Years of education: Not on file   Highest education level: Not on file  Occupational History   Not on file  Social Needs   Financial resource strain: Not on file   Food insecurity    Worry: Not on file    Inability: Not on file   Transportation needs    Medical: Not on file    Non-medical: Not on file  Tobacco Use   Smoking status: Never Smoker   Smokeless tobacco: Never Used   Tobacco comment: does not smoke  Substance and Sexual Activity   Alcohol use: Yes    Alcohol/week: 0.0 standard drinks    Comment: 1 glass of wine at a time just socially   Drug use: No   Sexual activity: Not on file  Lifestyle   Physical activity    Days per week: Not on file    Minutes per session: Not on file   Stress: Not on file  Relationships   Social connections    Talks on phone: Not on file    Gets together: Not on file    Attends religious service: Not on file    Active member of club or organization: Not on file    Attends meetings of clubs or organizations: Not on file    Relationship status: Not on file   Intimate partner violence    Fear of current or ex partner: Not on file    Emotionally abused: Not on file    Physically abused: Not on file    Forced sexual activity: Not on file  Other Topics Concern   Not on file  Social History Narrative   Married, employment - Network engineer. No caffeine. 33 yr education     FAMILY HISTORY:  Family History  Problem Relation Age of Onset   Heart disease Father    Hypertension Father      Diabetes Mother    Hypertension Mother     CURRENT MEDICATIONS:  Outpatient Encounter Medications as of 01/27/2019  Medication Sig   aspirin EC 81 MG tablet Take 81 mg by mouth daily.   famotidine (PEPCID) 20 MG tablet Take 20 mg by mouth at bedtime.   ipratropium (ATROVENT) 0.06 % nasal spray Place 2 sprays into both nostrils 2 (two) times daily.   levothyroxine (SYNTHROID) 50 MCG tablet TAKE 1 TABLET BY MOUTH EVERY DAY ON EMPTY STOMACH   lisinopril (ZESTRIL) 40 MG tablet Take 40 mg by mouth daily.    metoprolol tartrate (LOPRESSOR) 25 MG tablet Take 1 tablet (25 mg total) by mouth 2 (two) times daily.   montelukast (SINGULAIR) 10 MG  tablet Take 1 tablet by mouth at bedtime.   Multiple Vitamins-Minerals (HAIR/SKIN/NAILS PO) Take 1 tablet by mouth every morning.    Omega-3 Fatty Acids (OMEGA-3 2100 PO) Take 1 capsule by mouth every morning.    omeprazole (PRILOSEC) 20 MG capsule Take 20 mg by mouth 2 (two) times daily before a meal.    potassium chloride (K-DUR) 10 MEQ tablet Take 10 mEq by mouth 2 (two) times daily.    zinc gluconate 50 MG tablet Take 50 mg by mouth daily.     [DISCONTINUED] levothyroxine (SYNTHROID, LEVOTHROID) 50 MCG tablet TAKE 1 TABLET BY MOUTH EVERY DAY ON EMPTY STOMACH   [DISCONTINUED] lisinopril (ZESTRIL) 40 MG tablet Take 40 mg by mouth daily.   [DISCONTINUED] lisinopril-hydrochlorothiazide (PRINZIDE,ZESTORETIC) 20-12.5 MG tablet TAKE 1 TABLET BY MOUTH EVERY MORNING FOR BLOOD PRESSURE   No facility-administered encounter medications on file as of 01/27/2019.     ALLERGIES:  Allergies  Allergen Reactions   Penicillins Other (See Comments)    Has patient had a PCN reaction causing immediate rash, facial/tongue/throat swelling, SOB or lightheadedness with hypotension: No Has patient had a PCN reaction causing severe rash involving mucus membranes or skin necrosis: No Has patient had a PCN reaction that required hospitalization: No Has  patient had a PCN reaction occurring within the last 10 years: No If all of the above answers are "NO", then may proceed with Cephalosporin use.    Unknown "weird tingling feeling in legs"   Sulfonamide Derivatives Other (See Comments)    Leg Pain     PHYSICAL EXAM:  ECOG Performance status: 1  Vitals:   01/27/19 0835  BP: (!) 168/90  Pulse: 69  Resp: 18  Temp: (!) 97.1 F (36.2 C)  SpO2: 100%   Filed Weights   01/27/19 0834 01/27/19 0835  Weight: 153 lb 14.4 oz (69.8 kg) 153 lb 14.4 oz (69.8 kg)    Physical Exam Vitals signs reviewed.  Constitutional:      Appearance: Normal appearance.  Cardiovascular:     Rate and Rhythm: Normal rate and regular rhythm.     Heart sounds: Normal heart sounds.  Pulmonary:     Effort: Pulmonary effort is normal.     Breath sounds: Normal breath sounds.  Abdominal:     General: There is no distension.     Palpations: Abdomen is soft. There is no mass.  Musculoskeletal:        General: No swelling.  Lymphadenopathy:     Cervical: No cervical adenopathy.  Skin:    General: Skin is warm.  Neurological:     General: No focal deficit present.     Mental Status: She is alert and oriented to person, place, and time.  Psychiatric:        Mood and Affect: Mood normal.        Behavior: Behavior normal.      LABORATORY DATA:  I have reviewed the labs as listed.  CBC    Component Value Date/Time   WBC 5.3 01/22/2019 0853   RBC 4.43 01/22/2019 0853   HGB 13.4 01/22/2019 0853   HCT 41.4 01/22/2019 0853   PLT 158 01/22/2019 0853   MCV 93.5 01/22/2019 0853   MCH 30.2 01/22/2019 0853   MCHC 32.4 01/22/2019 0853   RDW 12.9 01/22/2019 0853   LYMPHSABS 1.3 01/22/2019 0853   MONOABS 0.5 01/22/2019 0853   EOSABS 0.0 01/22/2019 0853   BASOSABS 0.1 01/22/2019 0853   CMP Latest Ref Rng & Units  01/22/2019 01/22/2019 07/18/2018  Glucose 70 - 99 mg/dL 106(H) - 114(H)  BUN 8 - 23 mg/dL 14 - 11  Creatinine 0.44 - 1.00 mg/dL 0.69 0.70 0.71   Sodium 135 - 145 mmol/L 137 - 137  Potassium 3.5 - 5.1 mmol/L 4.2 - 4.0  Chloride 98 - 111 mmol/L 104 - 105  CO2 22 - 32 mmol/L 23 - 23  Calcium 8.9 - 10.3 mg/dL 9.0 - 9.0  Total Protein 6.5 - 8.1 g/dL 7.3 - 7.2  Total Bilirubin 0.3 - 1.2 mg/dL 1.0 - 0.5  Alkaline Phos 38 - 126 U/L 58 - 65  AST 15 - 41 U/L 22 - 20  ALT 0 - 44 U/L 20 - 22       DIAGNOSTIC IMAGING:  I have independently reviewed the scans and discussed with the patient.   I have reviewed Venita Lick LPN's note and agree with the documentation.  I personally performed a face-to-face visit, made revisions and my assessment and plan is as follows.    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  Right parotid adenocarcinoma: - Status post surgical resection in August 2016 followed by adjuvant radiation therapy 12/30/2014 through 02/09/2015. - CT of the soft tissue neck on 07/18/2018 shows postop parotidectomy on the right for tumor resection with no recurrent mass or adenopathy in the neck. - CT of the chest with contrast showed stable subcentimeter pulmonary nodules (5 mm nodule in the left upper lobe and 4 mm nodule in the central right lower lobe) consistent with benign postinflammatory etiology. -Physical exam today did not reveal any palpable masses.  She has mild tenderness at the angle of the jaw which is stable. -We reviewed results of the CT soft tissue neck from 01/22/2019 which showed stable posttreatment appearance of right parotid space.  Negative lymphadenopathy. -I will switch her to 1 year follow-up visits with repeat CT scan prior to next visit.  We will also check TSH level.      Orders placed this encounter:  Orders Placed This Encounter  Procedures   CT SOFT TISSUE NECK W WO CONTRAST   CBC with Differential/Platelet   Comprehensive metabolic panel   TSH      Derek Jack, MD [facility] [phone]

## 2019-01-27 NOTE — Assessment & Plan Note (Signed)
1.  Right parotid adenocarcinoma: - Status post surgical resection in August 2016 followed by adjuvant radiation therapy 12/30/2014 through 02/09/2015. - CT of the soft tissue neck on 07/18/2018 shows postop parotidectomy on the right for tumor resection with no recurrent mass or adenopathy in the neck. - CT of the chest with contrast showed stable subcentimeter pulmonary nodules (5 mm nodule in the left upper lobe and 4 mm nodule in the central right lower lobe) consistent with benign postinflammatory etiology. -Physical exam today did not reveal any palpable masses.  She has mild tenderness at the angle of the jaw which is stable. -We reviewed results of the CT soft tissue neck from 01/22/2019 which showed stable posttreatment appearance of right parotid space.  Negative lymphadenopathy. -I will switch her to 1 year follow-up visits with repeat CT scan prior to next visit.  We will also check TSH level.

## 2019-02-03 ENCOUNTER — Other Ambulatory Visit (HOSPITAL_COMMUNITY): Payer: Self-pay | Admitting: Family Medicine

## 2019-02-03 DIAGNOSIS — Z1231 Encounter for screening mammogram for malignant neoplasm of breast: Secondary | ICD-10-CM

## 2019-03-16 ENCOUNTER — Ambulatory Visit (HOSPITAL_COMMUNITY): Payer: Medicare HMO

## 2019-03-18 ENCOUNTER — Other Ambulatory Visit: Payer: Self-pay

## 2019-03-18 ENCOUNTER — Ambulatory Visit (HOSPITAL_COMMUNITY)
Admission: RE | Admit: 2019-03-18 | Discharge: 2019-03-18 | Disposition: A | Discharge status: Home / Self Care | Payer: Medicare HMO | Source: Ambulatory Visit | Attending: Family Medicine | Admitting: Family Medicine

## 2019-03-18 DIAGNOSIS — Z1231 Encounter for screening mammogram for malignant neoplasm of breast: Secondary | ICD-10-CM | POA: Diagnosis not present

## 2019-08-07 ENCOUNTER — Encounter (HOSPITAL_COMMUNITY): Payer: Self-pay | Admitting: *Deleted

## 2019-08-07 ENCOUNTER — Emergency Department (HOSPITAL_COMMUNITY)
Admission: EM | Admit: 2019-08-07 | Discharge: 2019-08-07 | Disposition: A | Discharge status: Home / Self Care | Payer: Medicare HMO | Attending: Emergency Medicine | Admitting: Emergency Medicine

## 2019-08-07 ENCOUNTER — Other Ambulatory Visit: Payer: Self-pay

## 2019-08-07 DIAGNOSIS — I10 Essential (primary) hypertension: Secondary | ICD-10-CM | POA: Insufficient documentation

## 2019-08-07 DIAGNOSIS — Z7982 Long term (current) use of aspirin: Secondary | ICD-10-CM | POA: Diagnosis not present

## 2019-08-07 DIAGNOSIS — Z79899 Other long term (current) drug therapy: Secondary | ICD-10-CM | POA: Insufficient documentation

## 2019-08-07 LAB — CBC WITH DIFFERENTIAL/PLATELET
Abs Immature Granulocytes: 0.01 10*3/uL (ref 0.00–0.07)
Basophils Absolute: 0.1 10*3/uL (ref 0.0–0.1)
Basophils Relative: 1 %
Eosinophils Absolute: 0 10*3/uL (ref 0.0–0.5)
Eosinophils Relative: 1 %
HCT: 37.2 % (ref 36.0–46.0)
Hemoglobin: 12.2 g/dL (ref 12.0–15.0)
Immature Granulocytes: 0 %
Lymphocytes Relative: 36 %
Lymphs Abs: 1.8 10*3/uL (ref 0.7–4.0)
MCH: 30.5 pg (ref 26.0–34.0)
MCHC: 32.8 g/dL (ref 30.0–36.0)
MCV: 93 fL (ref 80.0–100.0)
Monocytes Absolute: 0.5 10*3/uL (ref 0.1–1.0)
Monocytes Relative: 10 %
Neutro Abs: 2.5 10*3/uL (ref 1.7–7.7)
Neutrophils Relative %: 52 %
Platelets: 169 10*3/uL (ref 150–400)
RBC: 4 MIL/uL (ref 3.87–5.11)
RDW: 12.4 % (ref 11.5–15.5)
WBC: 4.8 10*3/uL (ref 4.0–10.5)
nRBC: 0 % (ref 0.0–0.2)

## 2019-08-07 LAB — COMPREHENSIVE METABOLIC PANEL
ALT: 18 U/L (ref 0–44)
AST: 19 U/L (ref 15–41)
Albumin: 3.6 g/dL (ref 3.5–5.0)
Alkaline Phosphatase: 58 U/L (ref 38–126)
Anion gap: 10 (ref 5–15)
BUN: 12 mg/dL (ref 8–23)
CO2: 21 mmol/L — ABNORMAL LOW (ref 22–32)
Calcium: 8.7 mg/dL — ABNORMAL LOW (ref 8.9–10.3)
Chloride: 103 mmol/L (ref 98–111)
Creatinine, Ser: 0.65 mg/dL (ref 0.44–1.00)
GFR calc Af Amer: >60 mL/min (ref >60)
GFR calc non Af Amer: >60 mL/min (ref >60)
Glucose, Bld: 100 mg/dL — ABNORMAL HIGH (ref 70–99)
Potassium: 3.9 mmol/L (ref 3.5–5.1)
Sodium: 134 mmol/L — ABNORMAL LOW (ref 135–145)
Total Bilirubin: 1 mg/dL (ref 0.3–1.2)
Total Protein: 7.1 g/dL (ref 6.5–8.1)

## 2019-08-07 MED ORDER — METOPROLOL TARTRATE 25 MG PO TABS
25.0000 mg | ORAL_TABLET | Freq: Once | ORAL | Status: AC
  Administered 2019-08-07: 25 mg via ORAL
  Filled 2019-08-07: qty 1

## 2019-08-07 NOTE — ED Provider Notes (Signed)
[facility] Provider Note   CSN: [id number] Arrival date & time: 08/07/19  1554     History No chief complaint on file.   [name] is a 79 y.o. female.  Patient complains that her blood pressures been running some elevated recently.  She takes lisinopril 40 mg a day and metoprolol 25 mg twice a day.  The history is provided by the patient. No language interpreter was used.  Hypertension This is a recurrent problem. The current episode started 2 days ago. The problem occurs constantly. The problem has not changed since onset.Pertinent negatives include no chest pain, no abdominal pain and no headaches. Nothing aggravates the symptoms. Nothing relieves the symptoms. She has tried nothing for the symptoms. The treatment provided no relief.       Past Medical History:  Diagnosis Date  . 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 HTN (hypertension)   . Low TSH level 01/2010  . Neuromuscular disorder ([facility])   . Paroxysmal atrial fibrillation ([facility])    normal echiocardiogram and stress nuclear; low TSH with normal T3 and T4   . PONV (postoperative nausea and vomiting)     Patient Active Problem List   Diagnosis Date Noted  . Fecal incontinence 06/21/2017  . Pulmonary nodule, left 03/15/2015  . 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 H/O superficial parotidectomy 11/16/2014  . 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 Irritable bowel syndrome 06/30/2012  . GERD (gastroesophageal reflux disease) 12/05/2011  . Lumbar spondylosis 07/09/2011  . 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 Disorders of sacrum 05/15/2011  . Paroxysmal atrial fibrillation (HCC)   . DJD (degenerative joint disease)   . Low TSH level 01/17/2010  . Hypertension 07/27/2009  . PLANTAR FACIITIS 07/22/2007    Past Surgical History:  Procedure Laterality Date  . ABDOMINAL HYSTERECTOMY     w/o oophorectomy  . APPENDECTOMY    . BUNIONECTOMY     bilateral  . CHOLECYSTECTOMY    . colonoscopy  03/09/15  . COLONOSCOPY N/A 03/09/2015   Procedure: COLONOSCOPY;  Surgeon: Rogene Houston, MD;  [facility];  Service: Endoscopy;  Laterality: N/A;  830  . ESOPHAGOGASTRODUODENOSCOPY  12/27/2011   Procedure: ESOPHAGOGASTRODUODENOSCOPY (EGD);  Surgeon: Rogene Houston, MD;  [facility];  Service: Endoscopy;  Laterality: N/A;  250  . HEMORRHOID SURGERY N/A 06/20/2015   Procedure: HEMORRHOIDECTOMY;  Surgeon: Aviva Signs, MD;  Location: [facility];  Service: General;  Laterality: N/A;  . PAROTIDECTOMY Right 11/16/2014   Procedure: RIGHT PAROTIDECTOMY;  Surgeon: Leta Baptist, MD;  Location: [facility];  Service: ENT;  Laterality: Right;  . RESECTION OF MEDIASTINAL MASS N/A 10/01/2014   Procedure: RESECTION OF MEDIASTINAL MASS;  Surgeon: Grace Isaac, MD;  Location: [facility];  Service: Thoracic;  Laterality: N/A;  . STERNOTOMY N/A 10/01/2014   Procedure: STERNOTOMY;  Surgeon: Grace Isaac, MD;  Location: [facility];  Service: Thoracic;  Laterality: N/A;  . TONSILLECTOMY    . TONSILLECTOMY    . VEIN LIGATION AND STRIPPING       OB History   No obstetric history on file.     Family History  Problem Relation Age of Onset  . Heart disease Father   . Hypertension Father   . Diabetes Mother   . Hypertension Mother     Social History   Tobacco Use  . Smoking  status: Never Smoker  . Smokeless tobacco: Never Used  . Tobacco comment: does not smoke  Substance Use Topics  . Alcohol use: Yes    Alcohol/week: 0.0 standard drinks    Comment: 1 glass of wine at a time just socially  . Drug use: No    Home Medications Prior to Admission medications   Medication Sig Start Date End Date Taking? Authorizing Provider  aspirin EC 81 MG tablet Take 81 mg by mouth daily.    [provider]  famotidine (PEPCID) 20 MG tablet Take 20 mg by mouth at bedtime.  12/08/18   [provider]  ipratropium (ATROVENT) 0.06 % nasal spray Place 2 sprays into both nostrils 2 (two) times daily.    [provider]  levothyroxine (SYNTHROID) 50 MCG tablet TAKE 1 TABLET BY MOUTH EVERY DAY ON EMPTY STOMACH 06/30/18   [provider]  lisinopril (ZESTRIL) 40 MG tablet Take 40 mg by mouth daily.  06/25/18   [provider]  metoprolol tartrate (LOPRESSOR) 25 MG tablet Take 1 tablet (25 mg total) by mouth 2 (two) times daily. 01/05/14   Lendon Colonel, NP  montelukast (SINGULAIR) 10 MG tablet Take 1 tablet by mouth at bedtime. 03/18/18   [provider]  Multiple Vitamins-Minerals (HAIR/SKIN/NAILS PO) Take 1 tablet by mouth every morning.     [provider]  Omega-3 Fatty Acids (OMEGA-3 2100 PO) Take 1 capsule by mouth every morning.     [provider]  omeprazole (PRILOSEC) 20 MG capsule Take 20 mg by mouth 2 (two) times daily before a meal.  11/13/16   [provider]  potassium chloride (K-DUR) 10 MEQ tablet Take 10 mEq by mouth 2 (two) times daily.     [provider]  zinc gluconate 50 MG tablet Take 50 mg by mouth daily.      [provider]    Allergies    Penicillins and Sulfonamide derivatives  Review of Systems   Review of Systems  Constitutional: Negative for appetite change and fatigue.  HENT: Negative for congestion, ear discharge and sinus pressure.   Eyes: Negative for discharge.  Respiratory: Negative for cough.   Cardiovascular: Negative for chest pain.  Gastrointestinal: Negative for abdominal pain and diarrhea.  Genitourinary: Negative for frequency and hematuria.  Musculoskeletal: Negative for back pain.  Skin: Negative for rash.  Neurological: Negative for seizures and headaches.  Psychiatric/Behavioral: Negative for hallucinations.    Physical Exam Updated Vital Signs BP (!) 174/75   Pulse 64   Resp 18   SpO2 100%   Physical Exam Vitals and  nursing note reviewed.  Constitutional:      Appearance: She is well-developed.  HENT:     Head: Normocephalic.     Nose: Nose normal.  Eyes:     General: No scleral icterus.    Conjunctiva/sclera: Conjunctivae normal.  Neck:     Thyroid: No thyromegaly.  Cardiovascular:     Rate and Rhythm: Normal rate and regular rhythm.     Heart sounds: No murmur. No friction rub. No gallop.   Pulmonary:     Breath sounds: No stridor. No wheezing or rales.  Chest:     Chest wall: No tenderness.  Abdominal:     General: There is no distension.     Tenderness: There is no abdominal tenderness. There is no rebound.  Musculoskeletal:        General: Normal range of motion.     Cervical back:  Neck supple.  Lymphadenopathy:     Cervical: No cervical adenopathy.  Skin:    Findings: No erythema or rash.  Neurological:     Mental Status: She is alert and oriented to person, place, and time.     Motor: No abnormal muscle tone.     Coordination: Coordination normal.  Psychiatric:        Behavior: Behavior normal.     ED Results / Procedures / Treatments   Labs (all labs ordered are listed, but only abnormal results are displayed) Labs Reviewed  COMPREHENSIVE METABOLIC PANEL - Abnormal; Notable for the following components:      Result Value   Sodium 134 (*)    CO2 21 (*)    Glucose, Bld 100 (*)    Calcium 8.7 (*)    All other components within normal limits  CBC WITH DIFFERENTIAL/PLATELET    EKG None  Radiology No results found.  Procedures Procedures (including critical care time)  Medications Ordered in ED Medications  metoprolol tartrate (LOPRESSOR) tablet 25 mg (25 mg Oral Given 08/07/19 1717)    ED Course  I have reviewed the triage vital signs and the nursing notes.  Pertinent labs & imaging results that were available during my care of the patient were reviewed by me and considered in my medical decision making (see chart for details).    MDM  Rules/Calculators/A&P                      Patient with poorly controlled blood pressure.  Labs unremarkable.  We will increase for metoprolol so she she is taking 50 mg in the evening and 25 mg in the morning       This patient presents to the ED for concern of hypertension, this involves an extensive number of treatment options, and is a complaint that carries with it a high risk of complications and morbidity.  The differential diagnosis includes poorly controlled high blood pressure   Lab Tests:   I Ordered, reviewed, and interpreted labs, which included CBC chemistries unremarkable  Medicines ordered:   I ordered medication Lopressor  Imaging Studies ordered:     Additional history obtained:   Additional history obtained from records and significant other  Previous records obtained and reviewed  Consultations Obtained:     Reevaluation:  After the interventions stated above, I reevaluated the patient and found improved  Critical Interventions:  .   Final Clinical Impression(s) / ED Diagnoses Final diagnoses:  None    Rx / DC Orders ED Discharge Orders    None       Milton Ferguson, MD 08/10/19 [phone]

## 2019-08-07 NOTE — ED Triage Notes (Signed)
Elevated blood pressure.

## 2019-08-07 NOTE — Discharge Instructions (Addendum)
Increase your Lopressor so you take 2 pills in the evening and 1 pill in the morning.  Follow-up with your doctor next week

## 2020-01-20 ENCOUNTER — Other Ambulatory Visit (HOSPITAL_COMMUNITY): Payer: Self-pay | Admitting: *Deleted

## 2020-01-25 ENCOUNTER — Other Ambulatory Visit (HOSPITAL_COMMUNITY): Payer: Medicare HMO

## 2020-01-26 ENCOUNTER — Ambulatory Visit (HOSPITAL_COMMUNITY): Admission: RE | Admit: 2020-01-26 | Payer: Medicare HMO | Source: Ambulatory Visit

## 2020-01-26 ENCOUNTER — Inpatient Hospital Stay (HOSPITAL_COMMUNITY): Payer: Medicare HMO

## 2020-01-29 ENCOUNTER — Encounter (HOSPITAL_COMMUNITY): Payer: Self-pay

## 2020-01-29 ENCOUNTER — Inpatient Hospital Stay (HOSPITAL_COMMUNITY): Payer: Medicare HMO

## 2020-01-29 ENCOUNTER — Ambulatory Visit (HOSPITAL_COMMUNITY): Payer: Medicare HMO

## 2020-02-02 ENCOUNTER — Ambulatory Visit (HOSPITAL_COMMUNITY): Payer: Medicare HMO | Admitting: Hematology

## 2020-02-03 ENCOUNTER — Other Ambulatory Visit (HOSPITAL_COMMUNITY): Payer: Self-pay | Admitting: Family Medicine

## 2020-02-03 DIAGNOSIS — Z1231 Encounter for screening mammogram for malignant neoplasm of breast: Secondary | ICD-10-CM

## 2020-02-05 ENCOUNTER — Telehealth (HOSPITAL_COMMUNITY): Payer: Self-pay | Admitting: Oncology

## 2020-02-05 NOTE — Telephone Encounter (Signed)
Re: Protid gland cancer  Spoke with patient about the denial from her insurance company to cover her CT soft tissue neck.  She has already spoken with her insurance company and has appealed this decision.  She should hear something back in the next 36 to 48 hours.  Faythe Casa, NP 02/05/2020 4:01 PM

## 2020-02-25 ENCOUNTER — Other Ambulatory Visit (HOSPITAL_COMMUNITY): Payer: Self-pay

## 2020-02-25 DIAGNOSIS — C07 Malignant neoplasm of parotid gland: Secondary | ICD-10-CM

## 2020-02-26 ENCOUNTER — Other Ambulatory Visit: Payer: Self-pay

## 2020-02-26 ENCOUNTER — Inpatient Hospital Stay (HOSPITAL_COMMUNITY): Payer: Medicare HMO | Attending: Hematology and Oncology

## 2020-02-26 ENCOUNTER — Ambulatory Visit (HOSPITAL_COMMUNITY)
Admission: RE | Admit: 2020-02-26 | Discharge: 2020-02-26 | Disposition: A | Discharge status: Home / Self Care | Payer: Medicare HMO | Source: Ambulatory Visit | Attending: Hematology | Admitting: Hematology

## 2020-02-26 DIAGNOSIS — C07 Malignant neoplasm of parotid gland: Secondary | ICD-10-CM

## 2020-02-26 DIAGNOSIS — E039 Hypothyroidism, unspecified: Secondary | ICD-10-CM | POA: Insufficient documentation

## 2020-02-26 DIAGNOSIS — Z85818 Personal history of malignant neoplasm of other sites of lip, oral cavity, and pharynx: Secondary | ICD-10-CM | POA: Diagnosis not present

## 2020-02-26 LAB — CBC WITH DIFFERENTIAL/PLATELET
Abs Immature Granulocytes: 0.02 10*3/uL (ref 0.00–0.07)
Basophils Absolute: 0.1 10*3/uL (ref 0.0–0.1)
Basophils Relative: 1 %
Eosinophils Absolute: 0 10*3/uL (ref 0.0–0.5)
Eosinophils Relative: 0 %
HCT: 38.3 % (ref 36.0–46.0)
Hemoglobin: 12.6 g/dL (ref 12.0–15.0)
Immature Granulocytes: 0 %
Lymphocytes Relative: 19 %
Lymphs Abs: 1.4 10*3/uL (ref 0.7–4.0)
MCH: 31.5 pg (ref 26.0–34.0)
MCHC: 32.9 g/dL (ref 30.0–36.0)
MCV: 95.8 fL (ref 80.0–100.0)
Monocytes Absolute: 0.6 10*3/uL (ref 0.1–1.0)
Monocytes Relative: 8 %
Neutro Abs: 5.4 10*3/uL (ref 1.7–7.7)
Neutrophils Relative %: 72 %
Platelets: 178 10*3/uL (ref 150–400)
RBC: 4 MIL/uL (ref 3.87–5.11)
RDW: 12.9 % (ref 11.5–15.5)
WBC: 7.5 10*3/uL (ref 4.0–10.5)
nRBC: 0 % (ref 0.0–0.2)

## 2020-02-26 LAB — COMPREHENSIVE METABOLIC PANEL
ALT: 20 U/L (ref 0–44)
AST: 22 U/L (ref 15–41)
Albumin: 3.9 g/dL (ref 3.5–5.0)
Alkaline Phosphatase: 53 U/L (ref 38–126)
Anion gap: 10 (ref 5–15)
BUN: 19 mg/dL (ref 8–23)
CO2: 23 mmol/L (ref 22–32)
Calcium: 9 mg/dL (ref 8.9–10.3)
Chloride: 101 mmol/L (ref 98–111)
Creatinine, Ser: 0.78 mg/dL (ref 0.44–1.00)
GFR, Estimated: >60 mL/min (ref >60)
Glucose, Bld: 108 mg/dL — ABNORMAL HIGH (ref 70–99)
Potassium: 3.7 mmol/L (ref 3.5–5.1)
Sodium: 134 mmol/L — ABNORMAL LOW (ref 135–145)
Total Bilirubin: 0.9 mg/dL (ref 0.3–1.2)
Total Protein: 7.1 g/dL (ref 6.5–8.1)

## 2020-02-26 LAB — TSH: TSH: 14.253 u[IU]/mL — ABNORMAL HIGH (ref 0.350–4.500)

## 2020-02-26 MED ORDER — IOHEXOL 300 MG/ML  SOLN
75.0000 mL | Freq: Once | INTRAMUSCULAR | Status: AC | PRN
  Administered 2020-02-26: 75 mL via INTRAVENOUS

## 2020-03-14 ENCOUNTER — Inpatient Hospital Stay (HOSPITAL_COMMUNITY): Payer: Medicare HMO | Admitting: Hematology

## 2020-03-14 ENCOUNTER — Other Ambulatory Visit: Payer: Self-pay

## 2020-03-14 VITALS — BP 159/81 | HR 65 | Temp 97.0°F | Resp 18 | Wt 149.0 lb

## 2020-03-14 DIAGNOSIS — C07 Malignant neoplasm of parotid gland: Secondary | ICD-10-CM | POA: Diagnosis not present

## 2020-03-14 DIAGNOSIS — Z85818 Personal history of malignant neoplasm of other sites of lip, oral cavity, and pharynx: Secondary | ICD-10-CM | POA: Diagnosis not present

## 2020-03-14 NOTE — Patient Instructions (Signed)
[facility] Discharge Instructions  You were seen today by Dr. Katragadda. He went over your recent results. Dr. Katragadda will see you back in 1 year for labs and follow up.   Thank you for choosing [facility] at [facility] to provide your oncology and hematology care.  To afford each patient quality time with our provider, please arrive at least 15 minutes before your scheduled appointment time.   If you have a lab appointment with the Cancer Center please come in thru the Main Entrance and check in at the main information desk  You need to re-schedule your appointment should you arrive 10 or more minutes late.  We strive to give you quality time with our providers, and arriving late affects you and other patients whose appointments are after yours.  Also, if you no show three or more times for appointments you may be dismissed from the clinic at the providers discretion.     Again, thank you for choosing [facility].  Our hope is that these requests will decrease the amount of time that you wait before being seen by our physicians.       _____________________________________________________________  Should you have questions after your visit to  Cancer Center, please contact our office at [phone] between the hours of 8:00 a.m. and 4:30 p.m.  Voicemails left after 4:00 p.m. will not be returned until the following business day.  For prescription refill requests, have your pharmacy contact our office and allow 72 hours.    Cancer Center Support Programs:   > Cancer Support Group  2nd Tuesday of the month 1pm-2pm, Journey Room    

## 2020-03-14 NOTE — Progress Notes (Signed)
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 Parotid surgical resection on 11/16/2014. 2. Adjuvant radiation therapy from 12/30/2014 to 02/09/2015.  NGS Results: Not done  CURRENT THERAPY: Observation  BRIEF ONCOLOGIC HISTORY:  Oncology History  Malignant tumor of parotid gland ([place])  08/24/2014 Imaging   CT neck: 19 mm mass in superficial right parotid consistent with neoplasm.  Partially visualized anterior mediastinal mass, at least 7 cm; appearance suggests thyroid nodule, but has no thyroid continuity.    08/27/2014 Imaging   CT chest: 9.6 x 3.8 x 6.1 cm lobulated, enhancing anterior mediastinal mass.    10/01/2014 Surgery   Resection of mediastinal mass/sternotomy Servando Snare): No malignancy; benign thyroid tissue.   11/16/2014 Initial Diagnosis   Malignant tumor of parotid gland ([place])   11/16/2014 Surgery   Right parotidectomy with facial nerve dissection & preservation ([place]): Adenocarcinoma, intermediate to focally high grade, nonspecific intercalated duct type. Tumor measures 1.8 cm. No perineural/angiolymphatic invasion. Negative margins.    11/16/2014 Pathologic Stage   pT1, pNx   12/07/2014 Imaging   PET scan: No evidence of metastatic disease or suspecious metabolic activity. Interval resection of right parotid tumor without suspicious residual activity. Interval resection of anterior mediastinal mass. Stable 32mm LUL lung nodule.    12/09/2014 Miscellaneous   No recommendation for adjuvant chemotherapy ([place]).    12/30/2014 - 02/09/2015 Radiation Therapy   Completed adjuvant IMRT Isidore Moos). Parotid bed, 60 Gy.  No nodal echelon treatment.      CANCER STAGING: Cancer Staging No matching staging information was found for the  patient.  INTERVAL HISTORY:  [name], a 79 y.o. female, returns for routine follow-up of her cancer of right parotid gland. [name] was last seen on 01/27/2019.   Today she reports feeling well. She denies having any issues with her appetite or swallowing issues. She reports having swelling in her right foot since having the venous ligation. Her Synthroid dose was adjusted 6 months ago and she will get it rechecked on 12/30 by her PCP.    REVIEW OF SYSTEMS:  Review of Systems  Constitutional: Positive for fatigue (75%). Negative for appetite change.  HENT:   Negative for trouble swallowing.   Cardiovascular: Positive for leg swelling (R leg swelling).  Neurological:       3/10 sciatic nerve pain  All other systems reviewed and are negative.   PAST MEDICAL/SURGICAL HISTORY:  Past Medical History:  Diagnosis Date  . 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 HTN (hypertension)   . Low TSH level 01/2010  . Neuromuscular disorder ([place])   . Paroxysmal atrial fibrillation ([facility])    normal echiocardiogram and stress nuclear; low TSH with normal T3 and T4   . PONV (postoperative nausea and vomiting)    Past Surgical History:  Procedure Laterality Date  . ABDOMINAL HYSTERECTOMY     w/o oophorectomy  . APPENDECTOMY    . BUNIONECTOMY     bilateral  . CHOLECYSTECTOMY    . colonoscopy  03/09/15  . COLONOSCOPY N/A 03/09/2015   Procedure: COLONOSCOPY;  Surgeon: Rogene Houston, MD;  [facility];  Service: Endoscopy;  Laterality: N/A;  830  . ESOPHAGOGASTRODUODENOSCOPY  12/27/2011  Procedure: ESOPHAGOGASTRODUODENOSCOPY (EGD);  Surgeon: Rogene Houston, MD;  [facility]: [facility];  Service: Endoscopy;  Laterality: N/A;  250  . HEMORRHOID SURGERY N/A 06/20/2015   Procedure: HEMORRHOIDECTOMY;  Surgeon: Aviva Signs, MD;  Location: [facility];  Service: General;  Laterality: N/A;  . PAROTIDECTOMY Right 11/16/2014   Procedure: RIGHT  PAROTIDECTOMY;  Surgeon: Leta Baptist, MD;  Location: [facility];  Service: ENT;  Laterality: Right;  . RESECTION OF MEDIASTINAL MASS N/A 10/01/2014   Procedure: RESECTION OF MEDIASTINAL MASS;  Surgeon: Grace Isaac, MD;  Location: [facility];  Service: Thoracic;  Laterality: N/A;  . STERNOTOMY N/A 10/01/2014   Procedure: STERNOTOMY;  Surgeon: Grace Isaac, MD;  Location: [facility];  Service: Thoracic;  Laterality: N/A;  . TONSILLECTOMY    . TONSILLECTOMY    . VEIN LIGATION AND STRIPPING      SOCIAL HISTORY:  Social History   Socioeconomic History  . Marital status: Married    Spouse name: Not on file  . Number of children: Not on file  . Years of education: Not on file  . Highest education level: Not on file  Occupational History  . Not on file  Tobacco Use  . Smoking status: Never Smoker  . Smokeless tobacco: Never Used  . Tobacco comment: does not smoke  Vaping Use  . Vaping Use: Never used  Substance and Sexual Activity  . Alcohol use: Yes    Alcohol/week: 0.0 standard drinks    Comment: 1 glass of wine at a time just socially  . Drug use: No  . Sexual activity: Not on file  Other Topics Concern  . Not on file  Social History Narrative   Married, employment - Network engineer. No caffeine. 32 yr education    Social Determinants of Radio broadcast assistant Strain: Not on file  Food Insecurity: Not on file  Transportation Needs: Not on file  Physical Activity: Not on file  Stress: Not on file  Social Connections: Not on file  Intimate Partner Violence: Not on file    FAMILY HISTORY:  Family History  Problem Relation Age of Onset  . Heart disease Father   . Hypertension Father   . Diabetes Mother   . Hypertension Mother     CURRENT MEDICATIONS:  Current Outpatient Medications  Medication Sig Dispense Refill  . aspirin EC 81 MG tablet Take 81 mg by mouth daily.    . famotidine (PEPCID) 20 MG tablet Take 20 mg by mouth at bedtime.    Marland Kitchen ipratropium  (ATROVENT) 0.06 % nasal spray Place 2 sprays into both nostrils 2 (two) times daily.    Marland Kitchen levothyroxine (SYNTHROID) 50 MCG tablet TAKE 1 TABLET BY MOUTH EVERY DAY ON EMPTY STOMACH    . lisinopril (ZESTRIL) 40 MG tablet Take 40 mg by mouth daily.     . metoprolol tartrate (LOPRESSOR) 25 MG tablet Take 1 tablet (25 mg total) by mouth 2 (two) times daily. 180 tablet 1  . montelukast (SINGULAIR) 10 MG tablet Take 1 tablet by mouth at bedtime.    . Multiple Vitamins-Minerals (HAIR/SKIN/NAILS PO) Take 1 tablet by mouth every morning.     . Omega-3 Fatty Acids (OMEGA-3 2100 PO) Take 1 capsule by mouth every morning.     Marland Kitchen omeprazole (PRILOSEC) 20 MG capsule Take 20 mg by mouth 2 (two) times daily before a meal.   3  . potassium chloride (K-DUR) 10 MEQ tablet Take 10 mEq by mouth 2 (two)  times daily.     Marland Kitchen zinc gluconate 50 MG tablet Take 50 mg by mouth daily.     No current facility-administered medications for this visit.    ALLERGIES:  Allergies  Allergen Reactions  . Penicillins Other (See Comments)    Has patient had a PCN reaction causing immediate rash, facial/tongue/throat swelling, SOB or lightheadedness with hypotension: No Has patient had a PCN reaction causing severe rash involving mucus membranes or skin necrosis: No Has patient had a PCN reaction that required hospitalization: No Has patient had a PCN reaction occurring within the last 10 years: No If all of the above answers are "NO", then may proceed with Cephalosporin use.    Unknown "weird tingling feeling in legs"  . Sulfonamide Derivatives Other (See Comments)    Leg Pain    PHYSICAL EXAM:  Performance status (ECOG): 1 - Symptomatic but completely ambulatory  Vitals:   03/14/20 1107 03/14/20 1122  BP:  (!) 159/81  Pulse: 65   Resp: 18   Temp: (!) 97 F (36.1 C)   SpO2: 100%    Wt Readings from Last 3 Encounters:  03/14/20 149 lb (67.6 kg)  01/27/19 153 lb 14.4 oz (69.8 kg)  07/22/18 159 lb (72.1 kg)    Physical Exam Vitals reviewed.  Constitutional:      Appearance: Normal appearance.  HENT:     Mouth/Throat:     Mouth: No injury or oral lesions.     Comments: R parotid resection well-healed and stable Cardiovascular:     Rate and Rhythm: Normal rate and regular rhythm.     Pulses: Normal pulses.     Heart sounds: Normal heart sounds.  Pulmonary:     Effort: Pulmonary effort is normal.     Breath sounds: Normal breath sounds.  Musculoskeletal:     Right lower leg: Edema (1+) present.     Left lower leg: No edema.  Neurological:     General: No focal deficit present.     Mental Status: She is alert and oriented to person, place, and time.  Psychiatric:        Mood and Affect: Mood normal.        Behavior: Behavior normal.      LABORATORY DATA:  I have reviewed the labs as listed.  CBC Latest Ref Rng & Units 02/26/2020 08/07/2019 01/22/2019  WBC 4.0 - 10.5 K/uL 7.5 4.8 5.3  Hemoglobin 12.0 - 15.0 g/dL 16.0 10.9 32.3  Hematocrit 36.0 - 46.0 % 38.3 37.2 41.4  Platelets 150 - 400 K/uL 178 169 158   CMP Latest Ref Rng & Units 02/26/2020 08/07/2019 01/22/2019  Glucose 70 - 99 mg/dL 557(D) 220(U) 542(H)  BUN 8 - 23 mg/dL 19 12 14   Creatinine 0.44 - 1.00 mg/dL 0.62 3.76  Sodium 135 - 145 mmol/L 134(L) 134(L) 137  Potassium 3.5 - 5.1 mmol/L 3.7 3.9 4.2  Chloride 98 - 111 mmol/L 101 103 104  CO2 22 - 32 mmol/L 23 21(L) 23  Calcium 8.9 - 10.3 mg/dL 9.0 2.83) 9.0  Total Protein 6.5 - 8.1 g/dL 7.1 7.1 7.3  Total Bilirubin 0.3 - 1.2 mg/dL 0.9 1.0 1.0  Alkaline Phos 38 - 126 U/L 53 58 58  AST 15 - 41 U/L 22 19 22   ALT 0 - 44 U/L 20 18 20     DIAGNOSTIC IMAGING:  I have independently reviewed the scans and discussed with the patient. 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 Surveillance. EXAM: CT NECK WITH CONTRAST TECHNIQUE: Multidetector CT imaging of the neck was  performed using the standard protocol following the bolus administration of intravenous contrast. CONTRAST:  2mL OMNIPAQUE IOHEXOL 300 MG/ML  SOLN COMPARISON:  CT of the neck January 22, 2019. FINDINGS: Pharynx and larynx: Normal. No mass or swelling. Salivary glands: Stable postsurgical changes of the right parotid gland with small residual parenchyma and no evidence of masslike enhancement to suggest recurrent disease. The stylomastoid foramen remains with normal size. The left parotid gland, bilateral submandibular glands and sublingual regions appear normal. Thyroid: Normal. Lymph nodes: None enlarged or abnormal density. Vascular: Negative. Limited intracranial: Negative. Visualized orbits: Negative. Mastoids and visualized paranasal sinuses: Clear. Skeleton: Postsurgical changes from sternotomy. Degenerative changes of the cervical spine with C3-4 anterolisthesis. Bilateral temporomandibular joint degenerative changes, more pronounced on the right. Upper chest: Negative. Other: None. IMPRESSION: Stable postsurgical changes of the right parotid gland without evidence of recurrent disease. Electronically Signed   By: Pedro Earls M.D.   On: 02/26/2020 20:22     ASSESSMENT:  1.  Right parotid adenocarcinoma: - Status post surgical resection in August 2016 followed by adjuvant radiation therapy 12/30/2014 through 02/09/2015. - CT of the soft tissue neck on 07/18/2018 shows postop parotidectomy on the right for tumor resection with no recurrent mass or adenopathy in the neck. - CT of the chest with contrast showed stable subcentimeter pulmonary nodules (5 mm nodule in the left upper lobe and 4 mm nodule in the central right lower lobe) consistent with benign postinflammatory etiology. -CT of the neck on 02/26/2020 showed stable postsurgical changes of the right parotid gland without evidence of recurrence.   PLAN:  1.  Right parotid adenocarcinoma: -Physical exam today did not reveal  any palpable masses in the neck. -Reviewed results of CT scan of the neck. -RTC 1 year for follow-up and physical exam.  CT scan if clinical condition dictates. -Reviewed labs which showed normal LFTs and CBC.  2.  Hypothyroidism: -TSH is increased at 14.25. -She has a follow-up appointment with her PMD on Thursday to adjust medication dose.    Orders placed this encounter:  Orders Placed This Encounter  Procedures  . CBC with Differential/Platelet  . Comprehensive metabolic panel  . TSH     Derek Jack, MD [place] [phone]   I, Milinda Antis, am acting as a scribe for Dr. Sanda Linger.  I, Derek Jack MD, have reviewed the above documentation for accuracy and completeness, and I agree with the above.

## 2020-03-20 IMAGING — MG DIGITAL SCREENING BILATERAL MAMMOGRAM WITH TOMO AND CAD
8 series · 8 of 24 positions shown · non-contrast
Comparison: Previous exam(s).

CLINICAL DATA: Screening.

EXAM:
DIGITAL SCREENING BILATERAL MAMMOGRAM WITH TOMO AND CAD

[L CC synth-2D]
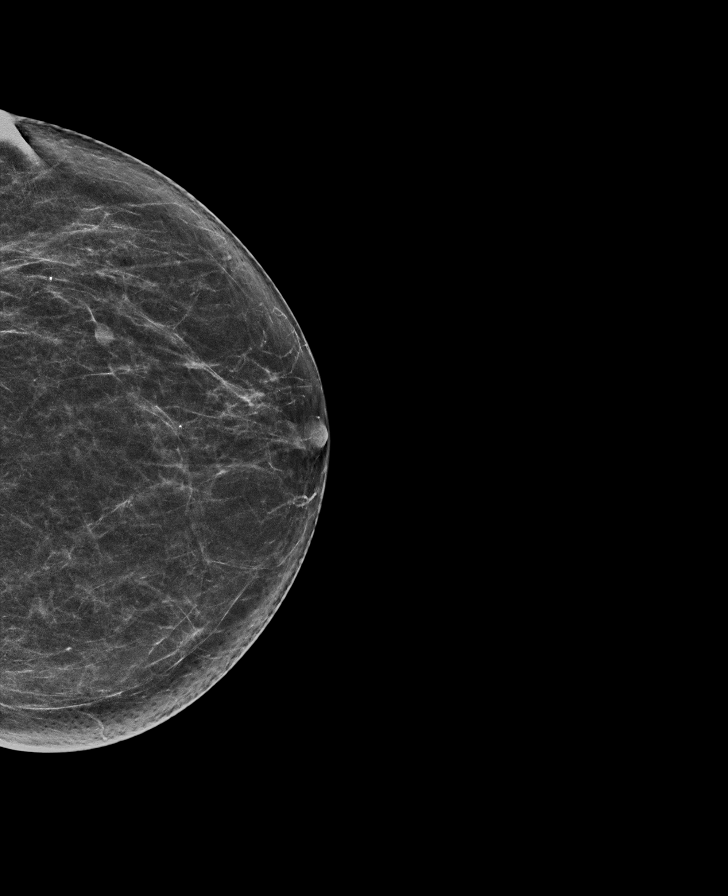

[L MLO synth-2D]
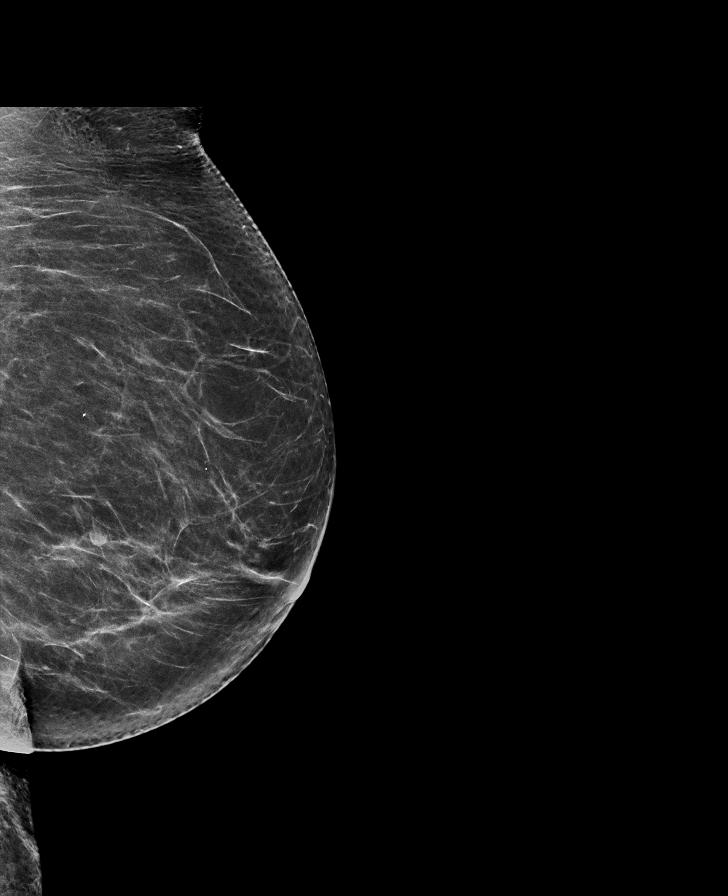

[R MLO synth-2D]
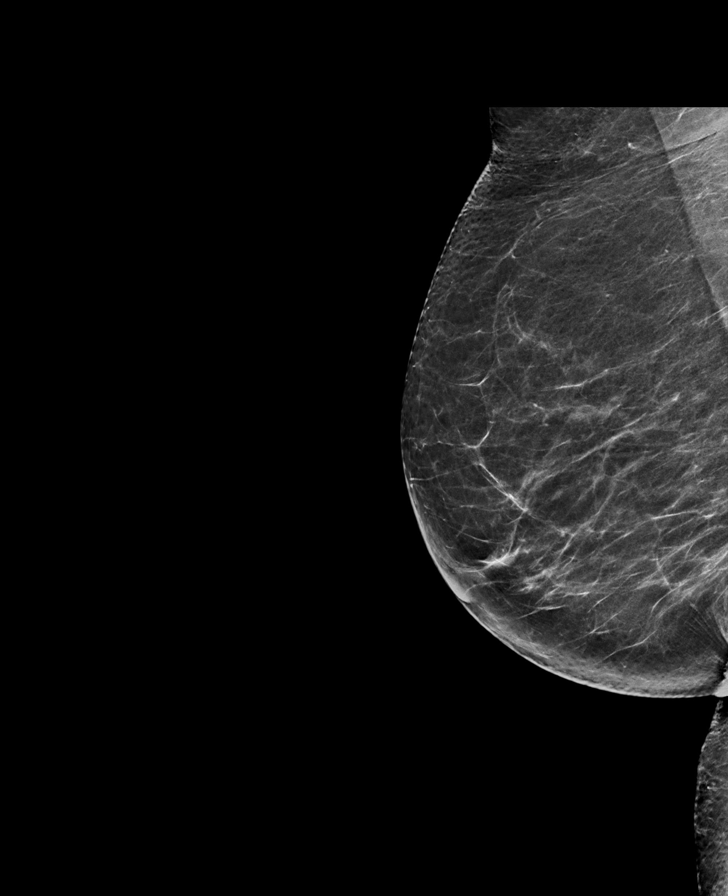

[R CC synth-2D]
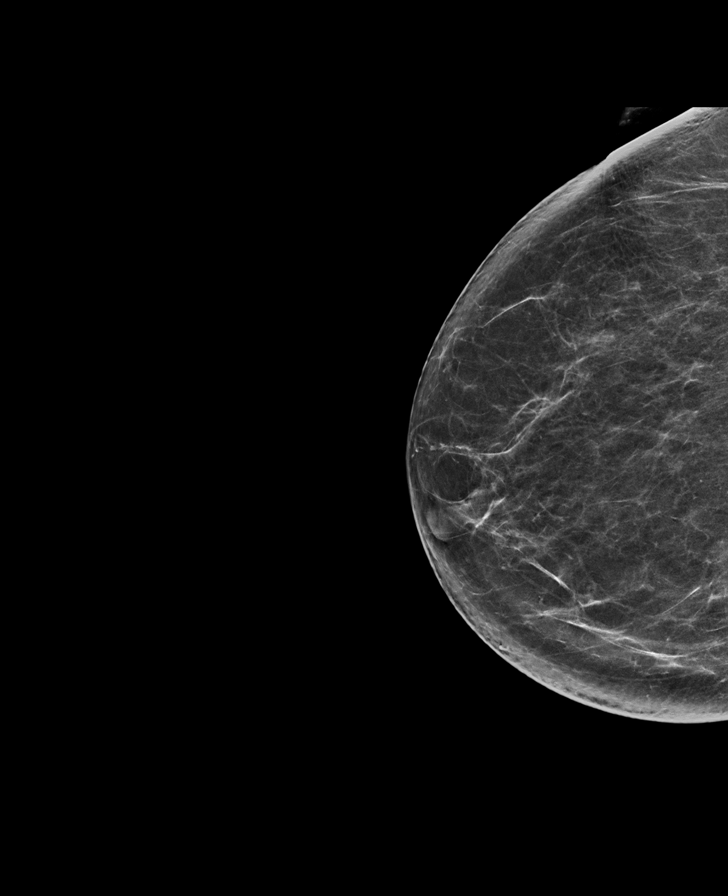

[L MLO tomo · tomo slice 36/71.0]
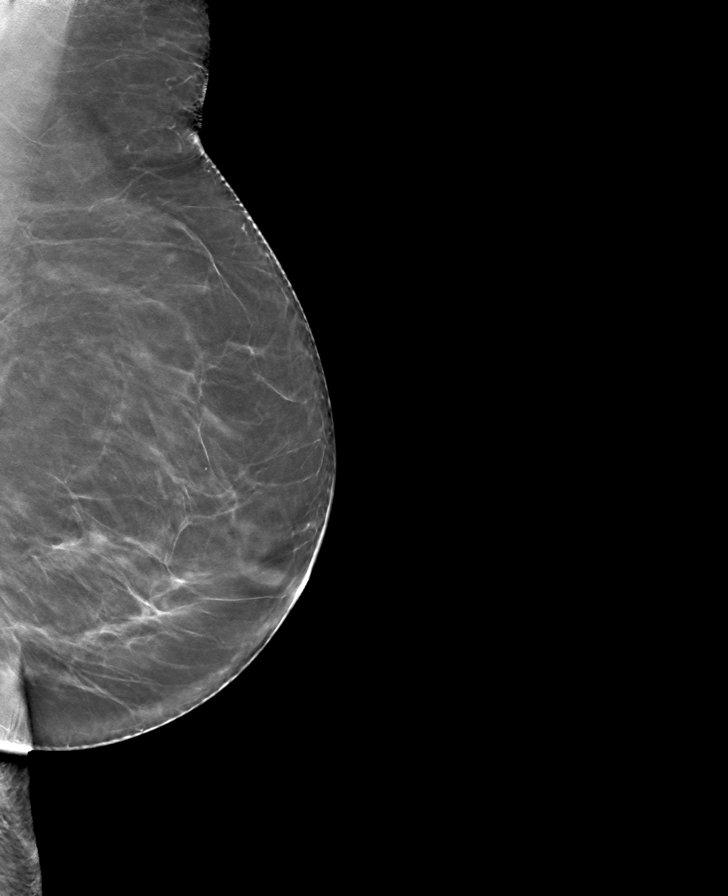

[R CC tomo · tomo slice 35/70.0]
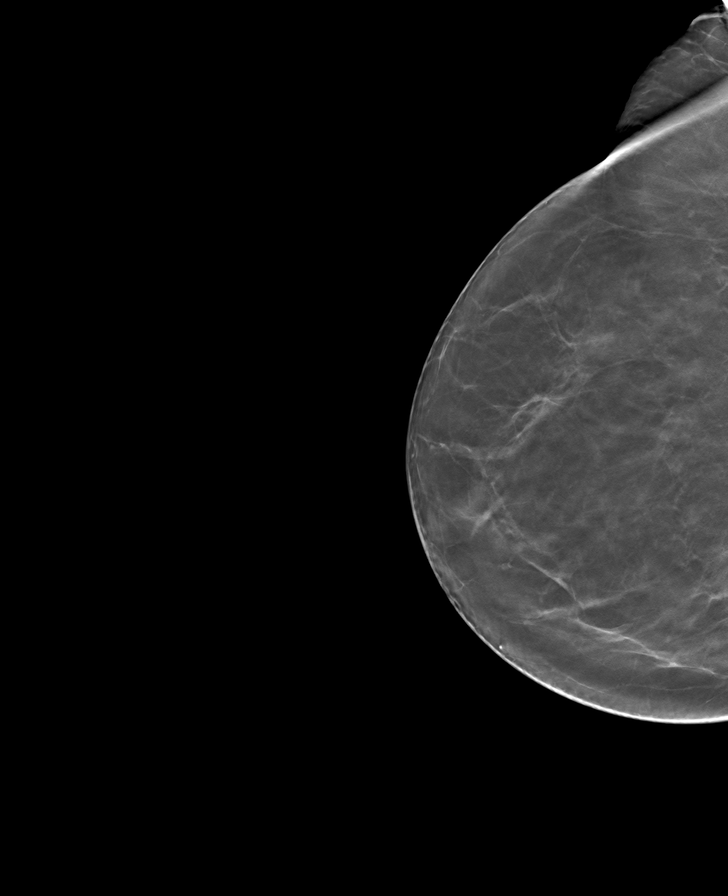

[L CC tomo · tomo slice 34/67.0]
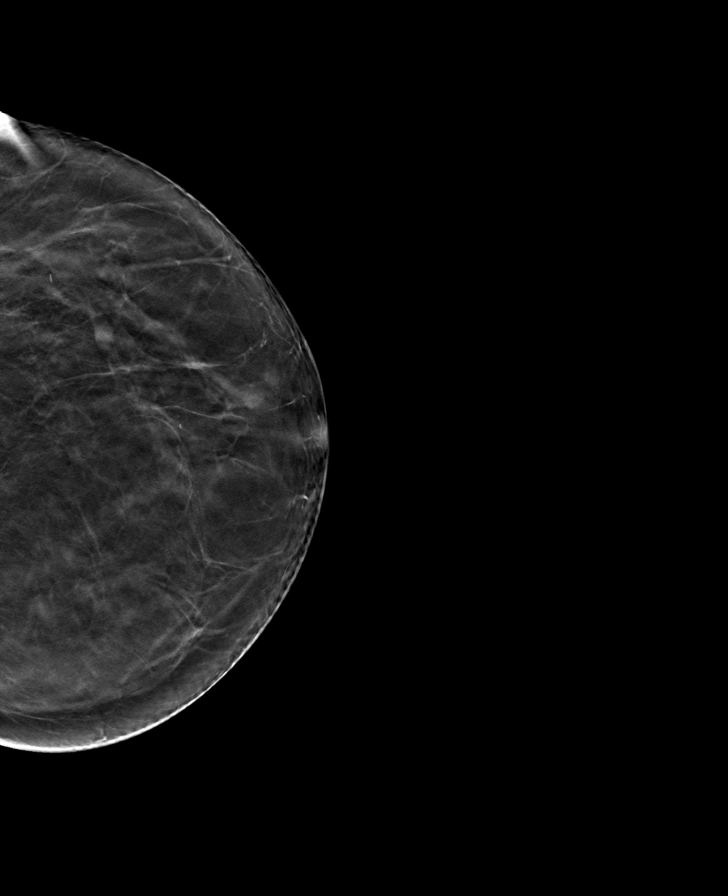

[R MLO tomo · tomo slice 35/69.0]
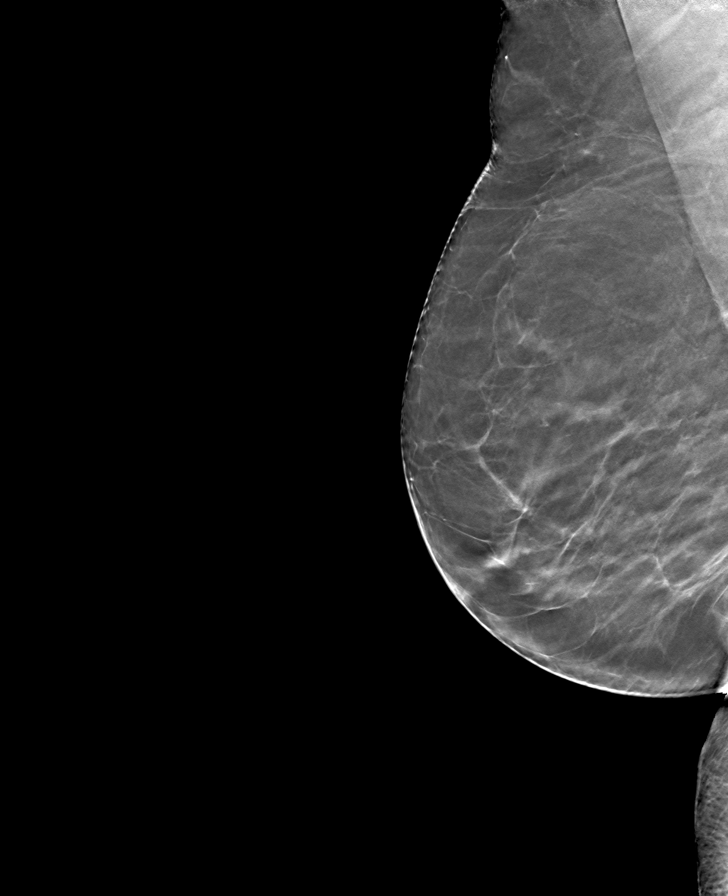

[8 of 24 positions shown; findings below may reference images not displayed]

ACR Breast Density Category b: There are scattered areas of
fibroglandular density.
FINDINGS: There are no findings suspicious for malignancy. Images were
processed with CAD.
IMPRESSION: No mammographic evidence of malignancy. A result letter of this
screening mammogram will be mailed directly to the patient.

RECOMMENDATION:
Screening mammogram in one year. (Code:[id number])

BI-RADS CATEGORY  1: Negative.

## 2020-03-22 DIAGNOSIS — M47816 Spondylosis without myelopathy or radiculopathy, lumbar region: Secondary | ICD-10-CM | POA: Diagnosis not present

## 2020-03-22 DIAGNOSIS — M545 Low back pain, unspecified: Secondary | ICD-10-CM | POA: Diagnosis not present

## 2020-03-22 DIAGNOSIS — M25551 Pain in right hip: Secondary | ICD-10-CM | POA: Diagnosis not present

## 2020-03-23 ENCOUNTER — Ambulatory Visit (HOSPITAL_COMMUNITY): Payer: Medicare HMO

## 2020-03-28 ENCOUNTER — Other Ambulatory Visit: Payer: Self-pay

## 2020-03-28 ENCOUNTER — Ambulatory Visit (HOSPITAL_COMMUNITY)
Admission: RE | Admit: 2020-03-28 | Discharge: 2020-03-28 | Disposition: A | Discharge status: Home / Self Care | Payer: Medicare HMO | Source: Ambulatory Visit | Attending: Family Medicine | Admitting: Family Medicine

## 2020-03-28 DIAGNOSIS — Z1231 Encounter for screening mammogram for malignant neoplasm of breast: Secondary | ICD-10-CM

## 2020-03-29 ENCOUNTER — Other Ambulatory Visit (HOSPITAL_COMMUNITY): Payer: Self-pay | Admitting: *Deleted

## 2020-03-30 DIAGNOSIS — R2689 Other abnormalities of gait and mobility: Secondary | ICD-10-CM | POA: Diagnosis not present

## 2020-03-30 DIAGNOSIS — R29898 Other symptoms and signs involving the musculoskeletal system: Secondary | ICD-10-CM | POA: Diagnosis not present

## 2020-03-30 DIAGNOSIS — M47816 Spondylosis without myelopathy or radiculopathy, lumbar region: Secondary | ICD-10-CM | POA: Diagnosis not present

## 2020-03-30 DIAGNOSIS — M6281 Muscle weakness (generalized): Secondary | ICD-10-CM | POA: Diagnosis not present

## 2020-03-30 DIAGNOSIS — E038 Other specified hypothyroidism: Secondary | ICD-10-CM | POA: Diagnosis not present

## 2020-03-30 DIAGNOSIS — M5431 Sciatica, right side: Secondary | ICD-10-CM | POA: Diagnosis not present

## 2020-04-06 DIAGNOSIS — R29898 Other symptoms and signs involving the musculoskeletal system: Secondary | ICD-10-CM | POA: Diagnosis not present

## 2020-04-06 DIAGNOSIS — M47816 Spondylosis without myelopathy or radiculopathy, lumbar region: Secondary | ICD-10-CM | POA: Diagnosis not present

## 2020-04-06 DIAGNOSIS — M6281 Muscle weakness (generalized): Secondary | ICD-10-CM | POA: Diagnosis not present

## 2020-04-06 DIAGNOSIS — M5431 Sciatica, right side: Secondary | ICD-10-CM | POA: Diagnosis not present

## 2020-04-06 DIAGNOSIS — R2689 Other abnormalities of gait and mobility: Secondary | ICD-10-CM | POA: Diagnosis not present

## 2020-04-11 DIAGNOSIS — M5431 Sciatica, right side: Secondary | ICD-10-CM | POA: Diagnosis not present

## 2020-04-11 DIAGNOSIS — R29898 Other symptoms and signs involving the musculoskeletal system: Secondary | ICD-10-CM | POA: Diagnosis not present

## 2020-04-11 DIAGNOSIS — R2689 Other abnormalities of gait and mobility: Secondary | ICD-10-CM | POA: Diagnosis not present

## 2020-04-11 DIAGNOSIS — M6281 Muscle weakness (generalized): Secondary | ICD-10-CM | POA: Diagnosis not present

## 2020-04-11 DIAGNOSIS — M47816 Spondylosis without myelopathy or radiculopathy, lumbar region: Secondary | ICD-10-CM | POA: Diagnosis not present

## 2020-04-15 DIAGNOSIS — R2689 Other abnormalities of gait and mobility: Secondary | ICD-10-CM | POA: Diagnosis not present

## 2020-04-15 DIAGNOSIS — M5431 Sciatica, right side: Secondary | ICD-10-CM | POA: Diagnosis not present

## 2020-04-15 DIAGNOSIS — M6281 Muscle weakness (generalized): Secondary | ICD-10-CM | POA: Diagnosis not present

## 2020-04-15 DIAGNOSIS — M47816 Spondylosis without myelopathy or radiculopathy, lumbar region: Secondary | ICD-10-CM | POA: Diagnosis not present

## 2020-04-15 DIAGNOSIS — R29898 Other symptoms and signs involving the musculoskeletal system: Secondary | ICD-10-CM | POA: Diagnosis not present

## 2020-04-18 DIAGNOSIS — R29898 Other symptoms and signs involving the musculoskeletal system: Secondary | ICD-10-CM | POA: Diagnosis not present

## 2020-04-18 DIAGNOSIS — M47816 Spondylosis without myelopathy or radiculopathy, lumbar region: Secondary | ICD-10-CM | POA: Diagnosis not present

## 2020-04-18 DIAGNOSIS — M5431 Sciatica, right side: Secondary | ICD-10-CM | POA: Diagnosis not present

## 2020-04-18 DIAGNOSIS — R2689 Other abnormalities of gait and mobility: Secondary | ICD-10-CM | POA: Diagnosis not present

## 2020-04-18 DIAGNOSIS — M6281 Muscle weakness (generalized): Secondary | ICD-10-CM | POA: Diagnosis not present

## 2020-04-22 DIAGNOSIS — M5431 Sciatica, right side: Secondary | ICD-10-CM | POA: Diagnosis not present

## 2020-04-22 DIAGNOSIS — R2689 Other abnormalities of gait and mobility: Secondary | ICD-10-CM | POA: Diagnosis not present

## 2020-04-22 DIAGNOSIS — M47816 Spondylosis without myelopathy or radiculopathy, lumbar region: Secondary | ICD-10-CM | POA: Diagnosis not present

## 2020-04-22 DIAGNOSIS — R29898 Other symptoms and signs involving the musculoskeletal system: Secondary | ICD-10-CM | POA: Diagnosis not present

## 2020-04-22 DIAGNOSIS — M6281 Muscle weakness (generalized): Secondary | ICD-10-CM | POA: Diagnosis not present

## 2020-04-25 DIAGNOSIS — R2689 Other abnormalities of gait and mobility: Secondary | ICD-10-CM | POA: Diagnosis not present

## 2020-04-25 DIAGNOSIS — M6281 Muscle weakness (generalized): Secondary | ICD-10-CM | POA: Diagnosis not present

## 2020-04-25 DIAGNOSIS — M5431 Sciatica, right side: Secondary | ICD-10-CM | POA: Diagnosis not present

## 2020-04-25 DIAGNOSIS — M47816 Spondylosis without myelopathy or radiculopathy, lumbar region: Secondary | ICD-10-CM | POA: Diagnosis not present

## 2020-04-25 DIAGNOSIS — R29898 Other symptoms and signs involving the musculoskeletal system: Secondary | ICD-10-CM | POA: Diagnosis not present

## 2020-04-26 DIAGNOSIS — H35363 Drusen (degenerative) of macula, bilateral: Secondary | ICD-10-CM | POA: Diagnosis not present

## 2020-04-26 DIAGNOSIS — H40013 Open angle with borderline findings, low risk, bilateral: Secondary | ICD-10-CM | POA: Diagnosis not present

## 2020-04-26 DIAGNOSIS — Z961 Presence of intraocular lens: Secondary | ICD-10-CM | POA: Diagnosis not present

## 2020-04-26 DIAGNOSIS — H04123 Dry eye syndrome of bilateral lacrimal glands: Secondary | ICD-10-CM | POA: Diagnosis not present

## 2020-04-29 DIAGNOSIS — M5431 Sciatica, right side: Secondary | ICD-10-CM | POA: Diagnosis not present

## 2020-04-29 DIAGNOSIS — M47816 Spondylosis without myelopathy or radiculopathy, lumbar region: Secondary | ICD-10-CM | POA: Diagnosis not present

## 2020-04-29 DIAGNOSIS — R29898 Other symptoms and signs involving the musculoskeletal system: Secondary | ICD-10-CM | POA: Diagnosis not present

## 2020-04-29 DIAGNOSIS — M2569 Stiffness of other specified joint, not elsewhere classified: Secondary | ICD-10-CM | POA: Diagnosis not present

## 2020-04-29 DIAGNOSIS — R2689 Other abnormalities of gait and mobility: Secondary | ICD-10-CM | POA: Diagnosis not present

## 2020-05-02 DIAGNOSIS — R29898 Other symptoms and signs involving the musculoskeletal system: Secondary | ICD-10-CM | POA: Diagnosis not present

## 2020-05-02 DIAGNOSIS — R2689 Other abnormalities of gait and mobility: Secondary | ICD-10-CM | POA: Diagnosis not present

## 2020-05-02 DIAGNOSIS — M2569 Stiffness of other specified joint, not elsewhere classified: Secondary | ICD-10-CM | POA: Diagnosis not present

## 2020-05-02 DIAGNOSIS — M47816 Spondylosis without myelopathy or radiculopathy, lumbar region: Secondary | ICD-10-CM | POA: Diagnosis not present

## 2020-05-02 DIAGNOSIS — M5431 Sciatica, right side: Secondary | ICD-10-CM | POA: Diagnosis not present

## 2020-05-06 DIAGNOSIS — M47816 Spondylosis without myelopathy or radiculopathy, lumbar region: Secondary | ICD-10-CM | POA: Diagnosis not present

## 2020-05-06 DIAGNOSIS — M5431 Sciatica, right side: Secondary | ICD-10-CM | POA: Diagnosis not present

## 2020-05-06 DIAGNOSIS — R2689 Other abnormalities of gait and mobility: Secondary | ICD-10-CM | POA: Diagnosis not present

## 2020-05-06 DIAGNOSIS — M2569 Stiffness of other specified joint, not elsewhere classified: Secondary | ICD-10-CM | POA: Diagnosis not present

## 2020-05-06 DIAGNOSIS — R29898 Other symptoms and signs involving the musculoskeletal system: Secondary | ICD-10-CM | POA: Diagnosis not present

## 2020-05-13 DIAGNOSIS — M5431 Sciatica, right side: Secondary | ICD-10-CM | POA: Diagnosis not present

## 2020-05-13 DIAGNOSIS — M47816 Spondylosis without myelopathy or radiculopathy, lumbar region: Secondary | ICD-10-CM | POA: Diagnosis not present

## 2020-05-13 DIAGNOSIS — M2569 Stiffness of other specified joint, not elsewhere classified: Secondary | ICD-10-CM | POA: Diagnosis not present

## 2020-05-13 DIAGNOSIS — R29898 Other symptoms and signs involving the musculoskeletal system: Secondary | ICD-10-CM | POA: Diagnosis not present

## 2020-05-13 DIAGNOSIS — R2689 Other abnormalities of gait and mobility: Secondary | ICD-10-CM | POA: Diagnosis not present

## 2020-05-16 DIAGNOSIS — M5431 Sciatica, right side: Secondary | ICD-10-CM | POA: Diagnosis not present

## 2020-05-16 DIAGNOSIS — R2689 Other abnormalities of gait and mobility: Secondary | ICD-10-CM | POA: Diagnosis not present

## 2020-05-16 DIAGNOSIS — M2569 Stiffness of other specified joint, not elsewhere classified: Secondary | ICD-10-CM | POA: Diagnosis not present

## 2020-05-16 DIAGNOSIS — R29898 Other symptoms and signs involving the musculoskeletal system: Secondary | ICD-10-CM | POA: Diagnosis not present

## 2020-05-16 DIAGNOSIS — M47816 Spondylosis without myelopathy or radiculopathy, lumbar region: Secondary | ICD-10-CM | POA: Diagnosis not present

## 2020-05-30 DIAGNOSIS — E039 Hypothyroidism, unspecified: Secondary | ICD-10-CM | POA: Diagnosis not present

## 2020-07-11 DIAGNOSIS — E038 Other specified hypothyroidism: Secondary | ICD-10-CM | POA: Diagnosis not present

## 2020-08-22 DIAGNOSIS — K219 Gastro-esophageal reflux disease without esophagitis: Secondary | ICD-10-CM | POA: Diagnosis not present

## 2020-08-22 DIAGNOSIS — R070 Pain in throat: Secondary | ICD-10-CM | POA: Diagnosis not present

## 2020-08-23 DIAGNOSIS — Z23 Encounter for immunization: Secondary | ICD-10-CM | POA: Diagnosis not present

## 2020-09-21 DIAGNOSIS — Z136 Encounter for screening for cardiovascular disorders: Secondary | ICD-10-CM | POA: Diagnosis not present

## 2020-09-21 DIAGNOSIS — Z9181 History of falling: Secondary | ICD-10-CM | POA: Insufficient documentation

## 2020-09-21 DIAGNOSIS — J309 Allergic rhinitis, unspecified: Secondary | ICD-10-CM | POA: Diagnosis not present

## 2020-09-21 DIAGNOSIS — E039 Hypothyroidism, unspecified: Secondary | ICD-10-CM | POA: Diagnosis not present

## 2020-09-21 DIAGNOSIS — Z0000 Encounter for general adult medical examination without abnormal findings: Secondary | ICD-10-CM | POA: Diagnosis not present

## 2020-09-21 DIAGNOSIS — R7302 Impaired glucose tolerance (oral): Secondary | ICD-10-CM | POA: Diagnosis not present

## 2020-09-21 DIAGNOSIS — Z13220 Encounter for screening for lipoid disorders: Secondary | ICD-10-CM | POA: Diagnosis not present

## 2020-10-05 DIAGNOSIS — S63004A Unspecified dislocation of right wrist and hand, initial encounter: Secondary | ICD-10-CM | POA: Diagnosis not present

## 2020-10-05 DIAGNOSIS — X58XXXA Exposure to other specified factors, initial encounter: Secondary | ICD-10-CM | POA: Diagnosis not present

## 2020-10-05 DIAGNOSIS — R230 Cyanosis: Secondary | ICD-10-CM | POA: Diagnosis not present

## 2020-10-20 DIAGNOSIS — I7300 Raynaud's syndrome without gangrene: Secondary | ICD-10-CM | POA: Diagnosis not present

## 2020-11-29 ENCOUNTER — Other Ambulatory Visit: Payer: Self-pay

## 2020-11-29 ENCOUNTER — Ambulatory Visit
Admission: RE | Admit: 2020-11-29 | Discharge: 2020-11-29 | Disposition: A | Discharge status: Home / Self Care | Payer: Medicare HMO | Source: Ambulatory Visit | Attending: Anesthesiology | Admitting: Anesthesiology

## 2020-11-29 ENCOUNTER — Other Ambulatory Visit: Payer: Self-pay | Admitting: Anesthesiology

## 2020-11-29 DIAGNOSIS — M47816 Spondylosis without myelopathy or radiculopathy, lumbar region: Secondary | ICD-10-CM | POA: Diagnosis not present

## 2020-11-29 DIAGNOSIS — G894 Chronic pain syndrome: Secondary | ICD-10-CM | POA: Diagnosis not present

## 2020-11-29 DIAGNOSIS — M47818 Spondylosis without myelopathy or radiculopathy, sacral and sacrococcygeal region: Secondary | ICD-10-CM | POA: Diagnosis not present

## 2020-11-29 DIAGNOSIS — M150 Primary generalized (osteo)arthritis: Secondary | ICD-10-CM | POA: Diagnosis not present

## 2020-11-29 DIAGNOSIS — Z79891 Long term (current) use of opiate analgesic: Secondary | ICD-10-CM | POA: Diagnosis not present

## 2020-11-29 DIAGNOSIS — R52 Pain, unspecified: Secondary | ICD-10-CM

## 2020-11-29 DIAGNOSIS — M533 Sacrococcygeal disorders, not elsewhere classified: Secondary | ICD-10-CM | POA: Diagnosis not present

## 2020-12-15 DIAGNOSIS — Z23 Encounter for immunization: Secondary | ICD-10-CM | POA: Diagnosis not present

## 2020-12-26 DIAGNOSIS — E039 Hypothyroidism, unspecified: Secondary | ICD-10-CM | POA: Diagnosis not present

## 2020-12-28 DIAGNOSIS — M47816 Spondylosis without myelopathy or radiculopathy, lumbar region: Secondary | ICD-10-CM | POA: Diagnosis not present

## 2020-12-28 DIAGNOSIS — G894 Chronic pain syndrome: Secondary | ICD-10-CM | POA: Diagnosis not present

## 2020-12-28 DIAGNOSIS — M150 Primary generalized (osteo)arthritis: Secondary | ICD-10-CM | POA: Diagnosis not present

## 2020-12-28 DIAGNOSIS — M47818 Spondylosis without myelopathy or radiculopathy, sacral and sacrococcygeal region: Secondary | ICD-10-CM | POA: Diagnosis not present

## 2021-01-03 DIAGNOSIS — K219 Gastro-esophageal reflux disease without esophagitis: Secondary | ICD-10-CM | POA: Diagnosis not present

## 2021-01-03 DIAGNOSIS — R490 Dysphonia: Secondary | ICD-10-CM | POA: Diagnosis not present

## 2021-02-02 DIAGNOSIS — D225 Melanocytic nevi of trunk: Secondary | ICD-10-CM | POA: Diagnosis not present

## 2021-02-02 DIAGNOSIS — Z1283 Encounter for screening for malignant neoplasm of skin: Secondary | ICD-10-CM | POA: Diagnosis not present

## 2021-02-02 DIAGNOSIS — L820 Inflamed seborrheic keratosis: Secondary | ICD-10-CM | POA: Diagnosis not present

## 2021-03-01 DIAGNOSIS — M47818 Spondylosis without myelopathy or radiculopathy, sacral and sacrococcygeal region: Secondary | ICD-10-CM | POA: Diagnosis not present

## 2021-03-01 DIAGNOSIS — M150 Primary generalized (osteo)arthritis: Secondary | ICD-10-CM | POA: Diagnosis not present

## 2021-03-01 DIAGNOSIS — G894 Chronic pain syndrome: Secondary | ICD-10-CM | POA: Diagnosis not present

## 2021-03-01 DIAGNOSIS — M47816 Spondylosis without myelopathy or radiculopathy, lumbar region: Secondary | ICD-10-CM | POA: Diagnosis not present

## 2021-03-07 ENCOUNTER — Other Ambulatory Visit (HOSPITAL_COMMUNITY): Payer: Self-pay | Admitting: Family Medicine

## 2021-03-07 DIAGNOSIS — Z1231 Encounter for screening mammogram for malignant neoplasm of breast: Secondary | ICD-10-CM

## 2021-03-27 ENCOUNTER — Inpatient Hospital Stay (HOSPITAL_COMMUNITY): Payer: Medicare HMO | Attending: Hematology

## 2021-03-27 ENCOUNTER — Other Ambulatory Visit: Payer: Self-pay

## 2021-03-27 DIAGNOSIS — E039 Hypothyroidism, unspecified: Secondary | ICD-10-CM | POA: Insufficient documentation

## 2021-03-27 DIAGNOSIS — Z85818 Personal history of malignant neoplasm of other sites of lip, oral cavity, and pharynx: Secondary | ICD-10-CM | POA: Insufficient documentation

## 2021-03-27 DIAGNOSIS — C07 Malignant neoplasm of parotid gland: Secondary | ICD-10-CM

## 2021-03-27 LAB — COMPREHENSIVE METABOLIC PANEL
ALT: 18 U/L (ref 0–44)
AST: 21 U/L (ref 15–41)
Albumin: 4 g/dL (ref 3.5–5.0)
Alkaline Phosphatase: 54 U/L (ref 38–126)
Anion gap: 9 (ref 5–15)
BUN: 21 mg/dL (ref 8–23)
CO2: 23 mmol/L (ref 22–32)
Calcium: 9 mg/dL (ref 8.9–10.3)
Chloride: 106 mmol/L (ref 98–111)
Creatinine, Ser: 0.81 mg/dL (ref 0.44–1.00)
GFR, Estimated: >60 mL/min (ref >60)
Glucose, Bld: 105 mg/dL — ABNORMAL HIGH (ref 70–99)
Potassium: 3.8 mmol/L (ref 3.5–5.1)
Sodium: 138 mmol/L (ref 135–145)
Total Bilirubin: 0.8 mg/dL (ref 0.3–1.2)
Total Protein: 7.5 g/dL (ref 6.5–8.1)

## 2021-03-27 LAB — CBC WITH DIFFERENTIAL/PLATELET
Abs Immature Granulocytes: 0.02 10*3/uL (ref 0.00–0.07)
Basophils Absolute: 0.1 10*3/uL (ref 0.0–0.1)
Basophils Relative: 1 %
Eosinophils Absolute: 0.1 10*3/uL (ref 0.0–0.5)
Eosinophils Relative: 1 %
HCT: 39.4 % (ref 36.0–46.0)
Hemoglobin: 13.2 g/dL (ref 12.0–15.0)
Immature Granulocytes: 0 %
Lymphocytes Relative: 24 %
Lymphs Abs: 1.4 10*3/uL (ref 0.7–4.0)
MCH: 32 pg (ref 26.0–34.0)
MCHC: 33.5 g/dL (ref 30.0–36.0)
MCV: 95.4 fL (ref 80.0–100.0)
Monocytes Absolute: 0.5 10*3/uL (ref 0.1–1.0)
Monocytes Relative: 9 %
Neutro Abs: 3.8 10*3/uL (ref 1.7–7.7)
Neutrophils Relative %: 65 %
Platelets: 175 10*3/uL (ref 150–400)
RBC: 4.13 MIL/uL (ref 3.87–5.11)
RDW: 13.2 % (ref 11.5–15.5)
WBC: 6 10*3/uL (ref 4.0–10.5)
nRBC: 0 % (ref 0.0–0.2)

## 2021-03-27 LAB — TSH: TSH: 3.93 u[IU]/mL (ref 0.350–4.500)

## 2021-03-28 DIAGNOSIS — E039 Hypothyroidism, unspecified: Secondary | ICD-10-CM | POA: Diagnosis not present

## 2021-03-28 DIAGNOSIS — M47816 Spondylosis without myelopathy or radiculopathy, lumbar region: Secondary | ICD-10-CM | POA: Diagnosis not present

## 2021-03-28 DIAGNOSIS — M48061 Spinal stenosis, lumbar region without neurogenic claudication: Secondary | ICD-10-CM | POA: Diagnosis not present

## 2021-03-30 ENCOUNTER — Other Ambulatory Visit: Payer: Self-pay

## 2021-03-30 ENCOUNTER — Ambulatory Visit (HOSPITAL_COMMUNITY)
Admission: RE | Admit: 2021-03-30 | Discharge: 2021-03-30 | Disposition: A | Discharge status: Home / Self Care | Payer: Medicare HMO | Source: Ambulatory Visit | Attending: Family Medicine | Admitting: Family Medicine

## 2021-03-30 DIAGNOSIS — Z1231 Encounter for screening mammogram for malignant neoplasm of breast: Secondary | ICD-10-CM | POA: Insufficient documentation

## 2021-04-03 ENCOUNTER — Other Ambulatory Visit: Payer: Self-pay

## 2021-04-03 ENCOUNTER — Inpatient Hospital Stay (HOSPITAL_COMMUNITY): Payer: Medicare HMO | Admitting: Hematology

## 2021-04-03 VITALS — BP 133/78 | HR 67 | Temp 98.5°F | Ht 61.0 in | Wt 143.8 lb

## 2021-04-03 DIAGNOSIS — C07 Malignant neoplasm of parotid gland: Secondary | ICD-10-CM

## 2021-04-03 DIAGNOSIS — Z85818 Personal history of malignant neoplasm of other sites of lip, oral cavity, and pharynx: Secondary | ICD-10-CM | POA: Diagnosis not present

## 2021-04-03 DIAGNOSIS — E039 Hypothyroidism, unspecified: Secondary | ICD-10-CM | POA: Diagnosis not present

## 2021-04-03 NOTE — Patient Instructions (Addendum)
[place] at [facility] Discharge Instructions   You were seen and examined today by Dr. Delton Coombes.  He reviewed your lab work which was normal.  Continue thyroid medication as prescribed.   Return as scheduled in 1 year.    Thank you for choosing [facility] to provide your oncology and hematology care.  To afford each patient quality time with our provider, please arrive at least 15 minutes before your scheduled appointment time.   If you have a lab appointment with the [facility] please come in thru the Main Entrance and check in at the main information desk.  You need to re-schedule your appointment should you arrive 10 or more minutes late.  We strive to give you quality time with our providers, and arriving late affects you and other patients whose appointments are after yours.  Also, if you no show three or more times for appointments you may be dismissed from the clinic at the providers discretion.     Again, thank you for choosing [facility].  Our hope is that these requests will decrease the amount of time that you wait before being seen by our physicians.       _____________________________________________________________  Should you have questions after your visit to [facility], please contact our office at [phone] and follow the prompts.  Our office hours are 8:00 a.m. and 4:30 p.m. Monday - Friday.  Please note that voicemails left after 4:00 p.m. may not be returned until the following business day.  We are closed weekends and major holidays.  You do have access to a nurse 24-7, just call the main number to the clinic [phone] and do not press any options, hold on the line and a nurse will answer the phone.    For prescription refill requests, have your pharmacy contact our office and allow 72 hours.    Due to Covid, you will need to wear a mask upon entering the hospital.  If you do not have a mask, a mask will be given to you at the Main Entrance upon arrival. For doctor visits, patients may have 1 support person age 72 or older with them. For treatment visits, patients can not have anyone with them due to social distancing guidelines and our immunocompromised population.

## 2021-04-03 NOTE — Progress Notes (Signed)
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 Parotid surgical resection on 11/16/2014. 2. Adjuvant radiation therapy from 12/30/2014 to 02/09/2015.  NGS Results: not done  CURRENT THERAPY: surveillance  BRIEF ONCOLOGIC HISTORY:  Oncology History  Malignant tumor of parotid gland ([place])  08/24/2014 Imaging   CT neck: 19 mm mass in superficial right parotid consistent with neoplasm.  Partially visualized anterior mediastinal mass, at least 7 cm; appearance suggests thyroid nodule, but has no thyroid continuity.    08/27/2014 Imaging   CT chest: 9.6 x 3.8 x 6.1 cm lobulated, enhancing anterior mediastinal mass.    10/01/2014 Surgery   Resection of mediastinal mass/sternotomy Servando Snare): No malignancy; benign thyroid tissue.   11/16/2014 Initial Diagnosis   Malignant tumor of parotid gland ([place])   11/16/2014 Surgery   Right parotidectomy with facial nerve dissection & preservation ([place]): Adenocarcinoma, intermediate to focally high grade, nonspecific intercalated duct type. Tumor measures 1.8 cm. No perineural/angiolymphatic invasion. Negative margins.    11/16/2014 Pathologic Stage   pT1, pNx   12/07/2014 Imaging   PET scan: No evidence of metastatic disease or suspecious metabolic activity. Interval resection of right parotid tumor without suspicious residual activity. Interval resection of anterior mediastinal mass. Stable 74mm LUL lung nodule.    12/09/2014 Miscellaneous   No recommendation for adjuvant chemotherapy ([place]).    12/30/2014 - 02/09/2015 Radiation Therapy   Completed adjuvant IMRT Isidore Moos). Parotid bed, 60 Gy.  No nodal echelon treatment.      CANCER STAGING:  Cancer Staging  No matching staging information was found for the  patient.  INTERVAL HISTORY:  [name], a 81 y.o. female, returns for routine follow-up of her cancer of right parotid gland. Saori was last seen on 03/14/2020.   Today she reports feeling good. She reports occasional soreness at the site of her right parotidectomy. She reports occasional swelling of her right leg.   REVIEW OF SYSTEMS:  Review of Systems  Constitutional:  Negative for appetite change and fatigue.  Psychiatric/Behavioral:  Positive for sleep disturbance.   All other systems reviewed and are negative.  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   HTN (hypertension)    Low TSH level 01/2010   Neuromuscular disorder (HCC)    Paroxysmal atrial fibrillation (HCC)    normal echiocardiogram and stress nuclear; low TSH with normal T3 and T4    PONV (postoperative nausea and vomiting)    Past Surgical History:  Procedure Laterality Date   ABDOMINAL HYSTERECTOMY     w/o oophorectomy   APPENDECTOMY     BUNIONECTOMY     bilateral   CHOLECYSTECTOMY     colonoscopy  03/09/15   COLONOSCOPY N/A 03/09/2015   Procedure: COLONOSCOPY;  Surgeon: Rogene Houston, MD;  [facility];  Service: Endoscopy;  Laterality: N/A;  830   ESOPHAGOGASTRODUODENOSCOPY  12/27/2011   Procedure: ESOPHAGOGASTRODUODENOSCOPY (EGD);  Surgeon: Rogene Houston, MD;  [facility]: [facility];  Service: Endoscopy;  Laterality: N/A;  250   HEMORRHOID SURGERY N/A 06/20/2015   Procedure: HEMORRHOIDECTOMY;  Surgeon: Aviva Signs, MD;  Location: [facility];  Service: General;  Laterality: N/A;   PAROTIDECTOMY Right 11/16/2014   Procedure: RIGHT PAROTIDECTOMY;  Surgeon: Leta Baptist, MD;  Location: [facility];  Service: ENT;  Laterality: Right;   RESECTION OF MEDIASTINAL MASS N/A 10/01/2014   Procedure: RESECTION OF MEDIASTINAL MASS;  Surgeon: Grace Isaac, MD;  Location: [facility];  Service: Thoracic;  Laterality: N/A;   STERNOTOMY N/A 10/01/2014   Procedure: STERNOTOMY;  Surgeon: Grace Isaac, MD;  Location: [facility] OR;  Service: Thoracic;  Laterality: N/A;   TONSILLECTOMY     TONSILLECTOMY     VEIN LIGATION AND STRIPPING      SOCIAL HISTORY:  Social History   Socioeconomic History   Marital status: Married    Spouse name: Not on file   Number of children: Not on file   Years of education: Not on file   Highest education level: Not on file  Occupational History   Not on file  Tobacco Use   Smoking status: Never   Smokeless tobacco: Never   Tobacco comments:    does not smoke  Vaping Use   Vaping Use: Never used  Substance and Sexual Activity   Alcohol use: Yes    Alcohol/week: 0.0 standard drinks    Comment: 1 glass of wine at a time just socially   Drug use: No   Sexual activity: Not on file  Other Topics Concern   Not on file  Social History Narrative   Married, employment - Network engineer. No caffeine. 34 yr education    Social Determinants of Radio broadcast assistant Strain: Not on file  Food Insecurity: Not on file  Transportation Needs: Not on file  Physical Activity: Not on file  Stress: Not on file  Social Connections: Not on file  Intimate Partner Violence: Not on file    FAMILY HISTORY:  Family History  Problem Relation Age of Onset   Heart disease Father    Hypertension Father    Diabetes Mother    Hypertension Mother     CURRENT MEDICATIONS:  Current Outpatient Medications  Medication Sig Dispense Refill   levothyroxine (SYNTHROID) 75 MCG tablet Take by mouth.     NIFEdipine (ADALAT CC) 30 MG 24 hr tablet Take by mouth.     aspirin EC 81 MG tablet Take 81 mg by mouth daily.     famotidine (PEPCID) 20 MG tablet Take 20 mg by mouth at bedtime.     ipratropium (ATROVENT) 0.06 % nasal spray Place 2 sprays into both nostrils 2 (two) times daily.     lisinopril (ZESTRIL) 40 MG tablet Take 40 mg by mouth daily.       metoprolol tartrate (LOPRESSOR) 25 MG tablet Take 1 tablet (25 mg total) by mouth 2 (two) times daily. 180 tablet 1   montelukast (SINGULAIR) 10 MG tablet Take 1 tablet by mouth at bedtime.     Multiple Vitamins-Minerals (HAIR/SKIN/NAILS PO) Take 1 tablet by mouth every morning.      Omega-3 Fatty Acids (OMEGA-3 2100 PO) Take 1 capsule by mouth every morning.      omeprazole (PRILOSEC) 20 MG capsule Take 20 mg by mouth 2 (two) times daily before a meal.   3   potassium chloride (K-DUR) 10 MEQ tablet Take 10 mEq by mouth 2 (two) times daily.      zinc gluconate 50 MG tablet Take 50 mg by mouth daily.     No current facility-administered medications for this visit.    ALLERGIES:  Allergies  Allergen Reactions   Penicillins Other (See Comments)    Has patient had a PCN reaction causing immediate rash, facial/tongue/throat swelling, SOB or lightheadedness with hypotension: No Has patient had a PCN reaction causing severe rash involving mucus membranes or skin necrosis: No Has patient had a PCN reaction that required hospitalization: No Has patient had a PCN reaction occurring within the last 10 years: No If all of the above answers are "NO", then may proceed with Cephalosporin use.    Unknown "weird tingling feeling in legs"   Sulfonamide Derivatives Other (See Comments)    Leg Pain    PHYSICAL EXAM:  Performance status (ECOG): 1 - Symptomatic but completely ambulatory  Vitals:   04/03/21 0819  BP: 133/78  Pulse: 67  Temp: 98.5 F (36.9 C)  SpO2: 100%   Wt Readings from Last 3 Encounters:  04/03/21 143 lb 12.8 oz (65.2 kg)  03/14/20 149 lb (67.6 kg)  01/27/19 153 lb 14.4 oz (69.8 kg)   Physical Exam Vitals reviewed.  Constitutional:      Appearance: Normal appearance.  Neck:     Comments: R parotidectomy site within normal limits Cardiovascular:     Rate and Rhythm: Normal rate and regular rhythm.     Pulses: Normal pulses.     Heart sounds: Normal heart sounds.   Pulmonary:     Effort: Pulmonary effort is normal.     Breath sounds: Normal breath sounds.  Musculoskeletal:     Right lower leg: Edema present.     Left lower leg: No edema.  Neurological:     General: No focal deficit present.     Mental Status: She is alert and oriented to person, place, and time.  Psychiatric:        Mood and Affect: Mood normal.        Behavior: Behavior normal.     LABORATORY DATA:  I have reviewed the labs as listed.  CBC Latest Ref Rng & Units 03/27/2021 02/26/2020 08/07/2019  WBC 4.0 - 10.5 K/uL 6.0 7.5 4.8  Hemoglobin 12.0 - 15.0 g/dL 13.2 12.6 12.2  Hematocrit 36.0 - 46.0 % 39.4 38.3 37.2  Platelets 150 - 400 K/uL 175 178 169   CMP Latest Ref Rng & Units 03/27/2021 02/26/2020 08/07/2019  Glucose 70 - 99 mg/dL 105(H) 108(H) 100(H)  BUN 8 - 23 mg/dL 21 19 12   Creatinine 0.44 - 1.00 mg/dL 0.81 0.78 0.65  Sodium 135 - 145 mmol/L 138 134(L) 134(L)  Potassium 3.5 - 5.1 mmol/L 3.8 3.7 3.9  Chloride 98 - 111 mmol/L 106 101 103  CO2 22 - 32 mmol/L 23 23 21(L)  Calcium 8.9 - 10.3 mg/dL 9.0 9.0 8.7(L)  Total Protein 6.5 - 8.1 g/dL 7.5 7.1 7.1  Total Bilirubin 0.3 - 1.2 mg/dL 0.8 0.9 1.0  Alkaline Phos 38 - 126 U/L 54 53 58  AST 15 - 41 U/L 21 22 19   ALT 0 - 44 U/L 18 20 18     DIAGNOSTIC IMAGING:  I have independently reviewed the scans and discussed with the patient. MM 3D SCREEN BREAST BILATERAL  Result Date: 03/30/2021 CLINICAL DATA:  Screening. EXAM: DIGITAL SCREENING BILATERAL MAMMOGRAM WITH TOMOSYNTHESIS AND CAD TECHNIQUE: Bilateral screening digital craniocaudal and mediolateral oblique mammograms were obtained. Bilateral screening digital breast tomosynthesis was performed. The images were evaluated with computer-aided detection. COMPARISON:  Previous exam(s). ACR Breast Density Category b: There are scattered areas of fibroglandular density. FINDINGS: There are no findings suspicious for malignancy. IMPRESSION: No mammographic  evidence of malignancy. A  result letter of this screening mammogram will be mailed directly to the patient. RECOMMENDATION: Screening mammogram in one year. (Code:SM-B-01Y) BI-RADS CATEGORY  1: Negative. Electronically Signed   By: Abelardo Diesel M.D.   On: 03/30/2021 11:39     ASSESSMENT:  1.  Right parotid adenocarcinoma: - Status post surgical resection in August 2016 followed by adjuvant radiation therapy 12/30/2014 through 02/09/2015. - CT of the soft tissue neck on 07/18/2018 shows postop parotidectomy on the right for tumor resection with no recurrent mass or adenopathy in the neck. - CT of the chest with contrast showed stable subcentimeter pulmonary nodules (5 mm nodule in the left upper lobe and 4 mm nodule in the central right lower lobe) consistent with benign postinflammatory etiology. -CT of the neck on 02/26/2020 showed stable postsurgical changes of the right parotid gland without evidence of recurrence.   PLAN:  1.  Right parotid adenocarcinoma: -Physical examination today did not reveal any palpable masses in the neck or adenopathy.  Right parotidectomy site within normal limits. - She reports occasional tenderness in that region. - Reviewed labs from 03/27/2021 which showed normal LFTs and CBC. - Recommend follow-up in 1 year with repeat labs and exam.  CT scan only if clinical condition dictates.   2.  Hypothyroidism: -Continue Synthroid 50 mcg daily.  TSH on 03/27/2021 was 3.9.   Orders placed this encounter:  No orders of the defined types were placed in this encounter.    Derek Jack, MD [place] [phone]   I, Thana Ates, am acting as a scribe for Dr. Derek Jack.  I, Derek Jack MD, have reviewed the above documentation for accuracy and completeness, and I agree with the above.

## 2021-04-04 DIAGNOSIS — H903 Sensorineural hearing loss, bilateral: Secondary | ICD-10-CM | POA: Diagnosis not present

## 2021-04-05 DIAGNOSIS — M4726 Other spondylosis with radiculopathy, lumbar region: Secondary | ICD-10-CM | POA: Diagnosis not present

## 2021-04-05 DIAGNOSIS — M4316 Spondylolisthesis, lumbar region: Secondary | ICD-10-CM | POA: Diagnosis not present

## 2021-04-05 DIAGNOSIS — M48061 Spinal stenosis, lumbar region without neurogenic claudication: Secondary | ICD-10-CM | POA: Diagnosis not present

## 2021-04-05 DIAGNOSIS — M545 Low back pain, unspecified: Secondary | ICD-10-CM | POA: Diagnosis not present

## 2021-04-05 DIAGNOSIS — M4126 Other idiopathic scoliosis, lumbar region: Secondary | ICD-10-CM | POA: Diagnosis not present

## 2021-04-05 DIAGNOSIS — M4317 Spondylolisthesis, lumbosacral region: Secondary | ICD-10-CM | POA: Diagnosis not present

## 2021-04-05 DIAGNOSIS — M4807 Spinal stenosis, lumbosacral region: Secondary | ICD-10-CM | POA: Diagnosis not present

## 2021-04-07 DIAGNOSIS — M5416 Radiculopathy, lumbar region: Secondary | ICD-10-CM | POA: Diagnosis not present

## 2021-04-07 DIAGNOSIS — M792 Neuralgia and neuritis, unspecified: Secondary | ICD-10-CM | POA: Diagnosis not present

## 2021-04-16 ENCOUNTER — Encounter (HOSPITAL_COMMUNITY): Payer: Self-pay | Admitting: Emergency Medicine

## 2021-04-16 ENCOUNTER — Emergency Department (HOSPITAL_COMMUNITY)
Admission: EM | Admit: 2021-04-16 | Discharge: 2021-04-16 | Disposition: A | Discharge status: Home / Self Care | Payer: Medicare HMO | Attending: Emergency Medicine | Admitting: Emergency Medicine

## 2021-04-16 ENCOUNTER — Emergency Department (HOSPITAL_COMMUNITY): Payer: Medicare HMO

## 2021-04-16 ENCOUNTER — Other Ambulatory Visit: Payer: Self-pay

## 2021-04-16 DIAGNOSIS — I10 Essential (primary) hypertension: Secondary | ICD-10-CM | POA: Insufficient documentation

## 2021-04-16 DIAGNOSIS — I160 Hypertensive urgency: Secondary | ICD-10-CM | POA: Diagnosis not present

## 2021-04-16 DIAGNOSIS — J9811 Atelectasis: Secondary | ICD-10-CM | POA: Diagnosis not present

## 2021-04-16 DIAGNOSIS — Z7982 Long term (current) use of aspirin: Secondary | ICD-10-CM | POA: Insufficient documentation

## 2021-04-16 DIAGNOSIS — R079 Chest pain, unspecified: Secondary | ICD-10-CM | POA: Diagnosis not present

## 2021-04-16 HISTORY — DX: Other intervertebral disc degeneration, lumbar region without mention of lumbar back pain or lower extremity pain: M51.369

## 2021-04-16 HISTORY — DX: Other intervertebral disc degeneration, lumbar region: M51.36

## 2021-04-16 LAB — BASIC METABOLIC PANEL
Anion gap: 5 (ref 5–15)
BUN: 20 mg/dL (ref 8–23)
CO2: 25 mmol/L (ref 22–32)
Calcium: 8.9 mg/dL (ref 8.9–10.3)
Chloride: 104 mmol/L (ref 98–111)
Creatinine, Ser: 0.82 mg/dL (ref 0.44–1.00)
GFR, Estimated: >60 mL/min (ref >60)
Glucose, Bld: 109 mg/dL — ABNORMAL HIGH (ref 70–99)
Potassium: 3.8 mmol/L (ref 3.5–5.1)
Sodium: 134 mmol/L — ABNORMAL LOW (ref 135–145)

## 2021-04-16 LAB — CBC
HCT: 37.9 % (ref 36.0–46.0)
Hemoglobin: 12.6 g/dL (ref 12.0–15.0)
MCH: 31.4 pg (ref 26.0–34.0)
MCHC: 33.2 g/dL (ref 30.0–36.0)
MCV: 94.5 fL (ref 80.0–100.0)
Platelets: 157 10*3/uL (ref 150–400)
RBC: 4.01 MIL/uL (ref 3.87–5.11)
RDW: 13.1 % (ref 11.5–15.5)
WBC: 5.2 10*3/uL (ref 4.0–10.5)
nRBC: 0 % (ref 0.0–0.2)

## 2021-04-16 LAB — TROPONIN I (HIGH SENSITIVITY)
Troponin I (High Sensitivity): 4 ng/L (ref <18)
Troponin I (High Sensitivity): 4 ng/L (ref <18)

## 2021-04-16 LAB — HEPATIC FUNCTION PANEL
ALT: 19 U/L (ref 0–44)
AST: 22 U/L (ref 15–41)
Albumin: 3.9 g/dL (ref 3.5–5.0)
Alkaline Phosphatase: 60 U/L (ref 38–126)
Bilirubin, Direct: 0.1 mg/dL (ref 0.0–0.2)
Indirect Bilirubin: 0.5 mg/dL (ref 0.3–0.9)
Total Bilirubin: 0.6 mg/dL (ref 0.3–1.2)
Total Protein: 7 g/dL (ref 6.5–8.1)

## 2021-04-16 LAB — LIPASE, BLOOD: Lipase: 59 U/L — ABNORMAL HIGH (ref 11–51)

## 2021-04-16 MED ORDER — AMLODIPINE BESYLATE 5 MG PO TABS
5.0000 mg | ORAL_TABLET | Freq: Once | ORAL | Status: AC
  Administered 2021-04-16: 5 mg via ORAL
  Filled 2021-04-16: qty 1

## 2021-04-16 MED ORDER — AMLODIPINE BESYLATE 5 MG PO TABS
5.0000 mg | ORAL_TABLET | Freq: Every day | ORAL | 0 refills | Status: DC
Start: 2021-04-16 — End: 2021-07-13

## 2021-04-16 NOTE — ED Provider Notes (Signed)
[facility] EMERGENCY DEPARTMENT Provider Note   CSN: [id number] Arrival date & time: 04/16/21  1547     History  Chief Complaint  Patient presents with   Chest Pain    [name] is a 81 y.o. female.  History of hypertension, atrial fibrillation. Patient presents to the emergency department on referral of urgent care provider earlier today.  Apparently her blood pressure has been elevated the past 3 days.  She checks it every day in the morning and systolic BP has been ranging between 180 and 200.  She normally has blood pressures ranging from 154-008 systolic.  She is on lisinopril and metoprolol daily and has good compliance with this.  She did take these this morning.  Patient also says that last night around 9 PM, she developed chest pain that was in the center of her chest/epigastric region.  This only lasted until she fell asleep so that was about 1 hour and resolved on its own.  When she woke up this morning she felt normal until around noon when she started experiencing left shoulder pain.  This lasted for about an hour and went away on its own.  She is completely asymptomatic at this time.  She is mostly concerned about her elevated blood pressure. She states that the urgent care provider saw concerning changes on her EKG and that she should be evaluated in the emergency department.   Chest Pain Associated symptoms: back pain    HPI: A 81 year old patient with a history of hypertension presents for evaluation of chest pain. Initial onset of pain was more than 6 hours ago. The patient's chest pain is well-localized and is not worse with exertion. The patient's chest pain is middle- or left-sided, is not described as heaviness/pressure/tightness, is not sharp and does not radiate to the arms/jaw/neck. The patient does not complain of nausea and denies diaphoresis. The patient has no history of stroke, has no history of peripheral artery disease, has not smoked in the past 90 days,  denies any history of treated diabetes, has no relevant family history of coronary artery disease (first degree relative at less than age 69), has no history of hypercholesterolemia and does not have an elevated BMI (>=30).   Home Medications Prior to Admission medications   Medication Sig Start Date End Date Taking? Authorizing Provider  amLODipine (NORVASC) 5 MG tablet Take 1 tablet (5 mg total) by mouth daily. 04/16/21 05/16/21 Yes Nelia Rogoff, Adora Fridge, PA-C  aspirin EC 81 MG tablet Take 81 mg by mouth daily.    [provider]  famotidine (PEPCID) 20 MG tablet Take 20 mg by mouth at bedtime. 12/08/18   [provider]  ipratropium (ATROVENT) 0.06 % nasal spray Place 2 sprays into both nostrils 2 (two) times daily.    [provider]  levothyroxine (SYNTHROID) 75 MCG tablet Take by mouth. 07/12/20   [provider]  lisinopril (ZESTRIL) 40 MG tablet Take 40 mg by mouth daily.  06/25/18   [provider]  metoprolol tartrate (LOPRESSOR) 25 MG tablet Take 1 tablet (25 mg total) by mouth 2 (two) times daily. 01/05/14   Lendon Colonel, NP  montelukast (SINGULAIR) 10 MG tablet Take 1 tablet by mouth at bedtime. 03/18/18   [provider]  Multiple Vitamins-Minerals (HAIR/SKIN/NAILS PO) Take 1 tablet by mouth every morning.     [provider]  Omega-3 Fatty Acids (OMEGA-3 2100 PO) Take 1 capsule by mouth every morning.     [provider]  omeprazole (PRILOSEC) 20 MG capsule Take 20 mg by mouth 2 (two) times daily before a meal.  11/13/16   [provider]  potassium chloride (K-DUR) 10 MEQ tablet Take 10 mEq by mouth 2 (two) times daily.     [provider]  zinc gluconate 50 MG tablet Take 50 mg by mouth daily.    [provider]      Allergies    Penicillins and Sulfonamide derivatives    Review of Systems   Review of Systems  Cardiovascular:  Positive for chest pain.       Elevated BP   Musculoskeletal:  Positive for back pain.  All other systems reviewed and are negative.  Physical Exam Updated Vital Signs BP (!) 190/93    Pulse 71    Temp 97.7 F (36.5 C) (Oral)    Resp 20    Ht 5\' 1"  (1.549 m)    Wt 65.2 kg    SpO2 99%    BMI 27.17 kg/m  Physical Exam Vitals and nursing note reviewed.  Constitutional:      General: She is not in acute distress.    Appearance: Normal appearance. She is not ill-appearing, toxic-appearing or diaphoretic.  HENT:     Head: Normocephalic and atraumatic.     Nose: No nasal deformity.     Mouth/Throat:     Lips: Pink. No lesions.     Mouth: Mucous membranes are moist. No injury, lacerations, oral lesions or angioedema.     Pharynx: Oropharynx is clear. Uvula midline. No pharyngeal swelling, oropharyngeal exudate, posterior oropharyngeal erythema or uvula swelling.  Eyes:     General: Gaze aligned appropriately. No scleral icterus.       Right eye: No discharge.        Left eye: No discharge.     Conjunctiva/sclera:     Right eye: Right conjunctiva is not injected. No exudate or hemorrhage.    Left eye: Left conjunctiva is not injected. No exudate or hemorrhage. Cardiovascular:     Rate and Rhythm: Normal rate and regular rhythm.     Pulses: Normal pulses.          Radial pulses are 2+ on the right side and 2+ on the left side.       Dorsalis pedis pulses are 2+ on the right side and 2+ on the left side.     Heart sounds: Normal heart sounds, S1 normal and S2 normal. Heart sounds not distant. No murmur heard.   No friction rub. No gallop. No S3 or S4 sounds.     Comments: Pulses equal in all ext. No reproducible anterior or posterior chest wall pain. Pulmonary:     Effort: Pulmonary effort is normal. No accessory muscle usage or respiratory distress.     Breath sounds: Normal breath sounds. No stridor. No wheezing, rhonchi or rales.  Chest:     Chest wall: No tenderness.  Abdominal:     General: Abdomen is flat. Bowel sounds are  normal. There is no distension.     Palpations: Abdomen is soft. There is no mass or pulsatile mass.     Tenderness: There is no abdominal tenderness. There is no guarding or rebound.  Musculoskeletal:     Right lower leg: No edema.     Left lower leg: No edema.  Skin:    General: Skin is warm and dry.     Coloration: Skin is not jaundiced or pale.  Findings: No bruising, erythema, lesion or rash.  Neurological:     General: No focal deficit present.     Mental Status: She is alert and oriented to person, place, and time.     GCS: GCS eye subscore is 4. GCS verbal subscore is 5. GCS motor subscore is 6.  Psychiatric:        Mood and Affect: Mood normal.        Behavior: Behavior normal. Behavior is cooperative.    ED Results / Procedures / Treatments   Labs (all labs ordered are listed, but only abnormal results are displayed) Labs Reviewed  BASIC METABOLIC PANEL - Abnormal; Notable for the following components:      Result Value   Sodium 134 (*)    Glucose, Bld 109 (*)    All other components within normal limits  LIPASE, BLOOD - Abnormal; Notable for the following components:   Lipase 59 (*)    All other components within normal limits  CBC  HEPATIC FUNCTION PANEL  TROPONIN I (HIGH SENSITIVITY)  TROPONIN I (HIGH SENSITIVITY)    EKG EKG Interpretation  Date/Time:  Sunday April 16 2021 16:02:10 EST Ventricular Rate:  63 PR Interval:  211 QRS Duration: 122 QT Interval:  400 QTC Calculation: 410 R Axis:   -61 Text Interpretation: Sinus rhythm Left bundle branch block no significant change since 2021 Confirmed by Sherwood Gambler [phone]) on 04/16/2021 4:04:01 PM  Radiology DG Chest Port 1 View  Result Date: 04/16/2021 CLINICAL DATA:  cp EXAM: PORTABLE CHEST 1 VIEW COMPARISON:  March 23, 2018 FINDINGS: The cardiomediastinal silhouette is unchanged in contour. No pleural effusion. No pneumothorax. LEFT basilar platelike opacity. Status post cholecystectomy. Status  post median sternotomy. IMPRESSION: LEFT basilar atelectasis. Electronically Signed   By: Valentino Saxon M.D.   On: 04/16/2021 17:07    Procedures Procedures  Cardiac monitoring in place while in ED  Medications Ordered in ED Medications  amLODipine (NORVASC) tablet 5 mg (5 mg Oral Given 04/16/21 1730)    ED Course/ Medical Decision Making/ A&P Clinical Course as of 04/16/21 2222  Sun Apr 16, 2021  1830 Repeat trop pending, likely dispo on increased BP medication [GL]    Clinical Course User Index [GL] Miah Boye, Adora Fridge, PA-C   HEAR Score: 3                       Medical Decision Making Problems Addressed: Hypertensive urgency: acute illness or injury  Amount and/or Complexity of Data Reviewed Independent Historian:     Details: ind historian External Data Reviewed: labs, radiology, ECG and notes. Labs: ordered. Decision-making details documented in ED Course. Radiology: ordered and independent interpretation performed. Decision-making details documented in ED Course. ECG/medicine tests: ordered and independent interpretation performed. Decision-making details documented in ED Course.  Risk Prescription drug management.   This is a 81 y.o. female with a PMH of hypertension, atrial fibrillation who presents to the ED with high blood pressure over the past three days. She had a transient episode of chest pain at rest last night that resolved without intervention. She felt normally until this afternoon when she started feeling a left sided upper back pain. She went to urgent care at that time because she was concerned since her BP has also been elevated. The symptoms had resolved prior to being evaluated. The Urgent care provider sent her to the ED because they saw concerning EKG changes and wanted her to be evaluated in ED.  Initial Impression: asymptomatic at this time, doubt ACS. Sounds like hypertensive urgency. Presentation is not consistent with dissection. Patient has low  risk Wells score and presentation not suggestive of PE. I will obtain chest pain labs and imaging to r/o other emergent causes and evaluate for end organ damage.   I personally reviewed all laboratory work and imaging. Abnormal results outlined below.  CBC normal. BMP with mild hyponatremia to 134, otherwise normal. Lipase is mildly elevated but similar to baseline. Hepatic function panel normal. Troponin negative x 2. CXR reveals possible left basilar atelectasis but no other concerning findings. EKG was unchanged from prior read that we have on file.  Events/Interventions: Gave dose of Amlodipine in ED  Impression: No evidence of end organ damage. Hypertensive Urgency. No need to rapidly treat BP. Will discharge home with prescription of Amlodipine. Plan to have patient follow up in office for reevaluation of BP.  I have seen and evaluated this patient in conjunction with my attending physician who agrees and has made changes to the plan accordingly.  Portions of this note were generated with [employer]. Dictation errors may occur despite best attempts at proofreading.  Final Clinical Impression(s) / ED Diagnoses Final diagnoses:  Hypertensive urgency    Rx / DC Orders ED Discharge Orders          Ordered    amLODipine (NORVASC) 5 MG tablet  Daily        04/16/21 1909              Sheila Oats 04/16/21 2222    Sherwood Gambler, MD 04/18/21 1724

## 2021-04-16 NOTE — Discharge Instructions (Signed)
I have added a medication called Amlodipine to your medications. You should take this once daily in the morning. Please follow up with your PCP to have your BP rechecked and have your medications evaluated.

## 2021-04-16 NOTE — ED Triage Notes (Signed)
Pt to the ED status post chest pain last night that radiates to her back.  Pt states the chest pain has subsided but the hypertension and back pain persists.

## 2021-04-17 DIAGNOSIS — M5416 Radiculopathy, lumbar region: Secondary | ICD-10-CM | POA: Diagnosis not present

## 2021-04-20 DIAGNOSIS — R079 Chest pain, unspecified: Secondary | ICD-10-CM | POA: Diagnosis not present

## 2021-04-25 DIAGNOSIS — C07 Malignant neoplasm of parotid gland: Secondary | ICD-10-CM | POA: Diagnosis not present

## 2021-04-25 DIAGNOSIS — I10 Essential (primary) hypertension: Secondary | ICD-10-CM | POA: Diagnosis not present

## 2021-04-25 DIAGNOSIS — N3946 Mixed incontinence: Secondary | ICD-10-CM | POA: Diagnosis not present

## 2021-04-27 DIAGNOSIS — Z961 Presence of intraocular lens: Secondary | ICD-10-CM | POA: Diagnosis not present

## 2021-04-27 DIAGNOSIS — H26492 Other secondary cataract, left eye: Secondary | ICD-10-CM | POA: Diagnosis not present

## 2021-04-27 DIAGNOSIS — H04123 Dry eye syndrome of bilateral lacrimal glands: Secondary | ICD-10-CM | POA: Diagnosis not present

## 2021-04-27 DIAGNOSIS — H40013 Open angle with borderline findings, low risk, bilateral: Secondary | ICD-10-CM | POA: Diagnosis not present

## 2021-04-28 ENCOUNTER — Encounter (HOSPITAL_COMMUNITY): Payer: Self-pay

## 2021-04-28 ENCOUNTER — Other Ambulatory Visit: Payer: Self-pay

## 2021-04-28 ENCOUNTER — Emergency Department (HOSPITAL_COMMUNITY): Payer: Medicare HMO

## 2021-04-28 ENCOUNTER — Emergency Department (HOSPITAL_COMMUNITY)
Admission: EM | Admit: 2021-04-28 | Discharge: 2021-04-28 | Disposition: A | Discharge status: Home / Self Care | Payer: Medicare HMO | Attending: Emergency Medicine | Admitting: Emergency Medicine

## 2021-04-28 DIAGNOSIS — R55 Syncope and collapse: Secondary | ICD-10-CM | POA: Insufficient documentation

## 2021-04-28 DIAGNOSIS — I4891 Unspecified atrial fibrillation: Secondary | ICD-10-CM | POA: Insufficient documentation

## 2021-04-28 DIAGNOSIS — Z043 Encounter for examination and observation following other accident: Secondary | ICD-10-CM | POA: Diagnosis not present

## 2021-04-28 DIAGNOSIS — Z79899 Other long term (current) drug therapy: Secondary | ICD-10-CM | POA: Diagnosis not present

## 2021-04-28 DIAGNOSIS — E039 Hypothyroidism, unspecified: Secondary | ICD-10-CM | POA: Diagnosis not present

## 2021-04-28 DIAGNOSIS — I10 Essential (primary) hypertension: Secondary | ICD-10-CM | POA: Diagnosis not present

## 2021-04-28 DIAGNOSIS — Z7982 Long term (current) use of aspirin: Secondary | ICD-10-CM | POA: Diagnosis not present

## 2021-04-28 LAB — CBC WITH DIFFERENTIAL/PLATELET
Abs Immature Granulocytes: 0.05 10*3/uL (ref 0.00–0.07)
Basophils Absolute: 0.1 10*3/uL (ref 0.0–0.1)
Basophils Relative: 1 %
Eosinophils Absolute: 0 10*3/uL (ref 0.0–0.5)
Eosinophils Relative: 0 %
HCT: 40.7 % (ref 36.0–46.0)
Hemoglobin: 13.2 g/dL (ref 12.0–15.0)
Immature Granulocytes: 1 %
Lymphocytes Relative: 12 %
Lymphs Abs: 1.1 10*3/uL (ref 0.7–4.0)
MCH: 30.7 pg (ref 26.0–34.0)
MCHC: 32.4 g/dL (ref 30.0–36.0)
MCV: 94.7 fL (ref 80.0–100.0)
Monocytes Absolute: 0.6 10*3/uL (ref 0.1–1.0)
Monocytes Relative: 6 %
Neutro Abs: 7.5 10*3/uL (ref 1.7–7.7)
Neutrophils Relative %: 80 %
Platelets: 160 10*3/uL (ref 150–400)
RBC: 4.3 MIL/uL (ref 3.87–5.11)
RDW: 13 % (ref 11.5–15.5)
WBC: 9.3 10*3/uL (ref 4.0–10.5)
nRBC: 0 % (ref 0.0–0.2)

## 2021-04-28 LAB — COMPREHENSIVE METABOLIC PANEL
ALT: 20 U/L (ref 0–44)
AST: 18 U/L (ref 15–41)
Albumin: 3.9 g/dL (ref 3.5–5.0)
Alkaline Phosphatase: 65 U/L (ref 38–126)
Anion gap: 8 (ref 5–15)
BUN: 26 mg/dL — ABNORMAL HIGH (ref 8–23)
CO2: 23 mmol/L (ref 22–32)
Calcium: 9.1 mg/dL (ref 8.9–10.3)
Chloride: 106 mmol/L (ref 98–111)
Creatinine, Ser: 0.82 mg/dL (ref 0.44–1.00)
GFR, Estimated: >60 mL/min (ref >60)
Glucose, Bld: 126 mg/dL — ABNORMAL HIGH (ref 70–99)
Potassium: 4.1 mmol/L (ref 3.5–5.1)
Sodium: 137 mmol/L (ref 135–145)
Total Bilirubin: 1.2 mg/dL (ref 0.3–1.2)
Total Protein: 7.2 g/dL (ref 6.5–8.1)

## 2021-04-28 LAB — TROPONIN I (HIGH SENSITIVITY)
Troponin I (High Sensitivity): 3 ng/L (ref <18)
Troponin I (High Sensitivity): 3 ng/L (ref <18)

## 2021-04-28 LAB — CBG MONITORING, ED: Glucose-Capillary: 96 mg/dL (ref 70–99)

## 2021-04-28 LAB — TSH: TSH: 2.333 u[IU]/mL (ref 0.350–4.500)

## 2021-04-28 NOTE — ED Notes (Signed)
Patient transported to CT 

## 2021-04-28 NOTE — ED Notes (Signed)
Pt ambulated to restroom without difficulties.  

## 2021-04-28 NOTE — ED Provider Notes (Signed)
[facility] Provider Note   CSN: [id number] Arrival date & time: 04/28/21  1223     History  Chief Complaint  Patient presents with   Loss of Consciousness    [name] is a 81 y.o. female.   Loss of Consciousness Associated symptoms: no chest pain, no dizziness, no headaches, no nausea, no shortness of breath, no vomiting and no weakness       [name] is a 81 y.o. female with past medical history of hypertension, paroxysmal atrial fibrillation, hypothyroidism who presents to the Emergency Department complaining of syncopal episode that occurred this morning while standing in the kitchen.  She states that she was talking on the phone when she suddenly lost her hearing and "blacked out for a few seconds"  She recalls the events stating that she laid in the floor for several minutes before allowing her husband to help her stand.  She recalls feeling dizzy at the time, but denies headache and visual changes and no longer feels dizzy.  She took her lisinopril and amlodipine this morning prior to the syncopal episode.  She took her metoprolol approximately 30 minutes after the event.  She also checked her blood pressure at the time and states her systolic was 782.  She had one episode of loose stool.  She was started on amlodipine at the end of January.  She feels that her syncopal episode could be related to a drop in her blood pressure although she states she has never had this happen before.  She denies any chest pain.  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   Does not currently see a cardiologist or take any anticoagulant.  She states that she feels back to her baseline at this time.  She denies any chest pain or shortness of breath.    Home Medications Prior to Admission medications    Medication Sig Start Date End Date Taking? Authorizing Provider  amLODipine (NORVASC) 5 MG tablet Take 1 tablet (5 mg total) by mouth daily. 04/16/21 05/16/21  Loeffler, Adora Fridge, PA-C  aspirin EC 81 MG tablet Take 81 mg by mouth daily.    [provider]  famotidine (PEPCID) 20 MG tablet Take 20 mg by mouth at bedtime. 12/08/18   [provider]  ipratropium (ATROVENT) 0.06 % nasal spray Place 2 sprays into both nostrils 2 (two) times daily.    [provider]  levothyroxine (SYNTHROID) 75 MCG tablet Take by mouth. 07/12/20   [provider]  lisinopril (ZESTRIL) 40 MG tablet Take 40 mg by mouth daily.  06/25/18   [provider]  metoprolol tartrate (LOPRESSOR) 25 MG tablet Take 1 tablet (25 mg total) by mouth 2 (two) times daily. 01/05/14   Lendon Colonel, NP  montelukast (SINGULAIR) 10 MG tablet Take 1 tablet by mouth at bedtime. 03/18/18   [provider]  Multiple Vitamins-Minerals (HAIR/SKIN/NAILS PO) Take 1 tablet by mouth every morning.     [provider]  Omega-3 Fatty Acids (OMEGA-3 2100 PO) Take 1 capsule by mouth every morning.     [provider]  omeprazole (PRILOSEC) 20 MG capsule Take 20 mg by mouth 2 (two) times daily before a meal.  11/13/16   [provider]  potassium chloride (K-DUR) 10 MEQ tablet Take 10 mEq by  mouth 2 (two) times daily.     [provider]  zinc gluconate 50 MG tablet Take 50 mg by mouth daily.    [provider]      Allergies    Penicillins, Sulfur, and Sulfonamide derivatives    Review of Systems   Review of Systems  Respiratory:  Negative for shortness of breath.   Cardiovascular:  Positive for syncope. Negative for chest pain.  Gastrointestinal:  Negative for abdominal pain, nausea and vomiting.  Neurological:  Positive for syncope. Negative for dizziness, speech difficulty, weakness, numbness and headaches.  All other systems reviewed and are  negative.  Physical Exam Updated Vital Signs BP (!) 144/73    Pulse 72    Temp 97.6 F (36.4 C) (Oral)    Resp 10    Ht 5\' 1"  (1.549 m)    Wt 65.2 kg    SpO2 100%    BMI 27.16 kg/m  Physical Exam Vitals and nursing note reviewed.  Constitutional:      General: She is not in acute distress.    Appearance: Normal appearance. She is not ill-appearing.  HENT:     Head: Atraumatic.     Mouth/Throat:     Mouth: Mucous membranes are moist.  Eyes:     Extraocular Movements: Extraocular movements intact.     Conjunctiva/sclera: Conjunctivae normal.  Cardiovascular:     Rate and Rhythm: Normal rate and regular rhythm.     Pulses: Normal pulses.  Pulmonary:     Effort: Pulmonary effort is normal.     Breath sounds: Normal breath sounds.  Abdominal:     Palpations: Abdomen is soft.     Tenderness: There is no abdominal tenderness.  Musculoskeletal:     Cervical back: No tenderness.     Right lower leg: No edema.     Left lower leg: No edema.  Skin:    General: Skin is warm.     Capillary Refill: Capillary refill takes less than 2 seconds.     Findings: No rash.  Neurological:     General: No focal deficit present.     Mental Status: She is alert.     GCS: GCS eye subscore is 4. GCS verbal subscore is 5. GCS motor subscore is 6.     Sensory: Sensation is intact. No sensory deficit.     Motor: Motor function is intact. No weakness.     Coordination: Coordination normal.     Gait: Gait is intact.     Comments: CN II through XII intact.  Speech clear.  No facial droop, no pronator drift.  Ambulates with steady gait    ED Results / Procedures / Treatments   Labs (all labs ordered are listed, but only abnormal results are displayed) Labs Reviewed  COMPREHENSIVE METABOLIC PANEL - Abnormal; Notable for the following components:      Result Value   Glucose, Bld 126 (*)    BUN 26 (*)    All other components within normal limits  CBC WITH DIFFERENTIAL/PLATELET  TSH  CBG MONITORING,  ED  CBG MONITORING, ED  TROPONIN I (HIGH SENSITIVITY)  TROPONIN I (HIGH SENSITIVITY)    EKG EKG Interpretation  Date/Time:  Friday April 28 2021 17:47:51 EST Ventricular Rate:  75 PR Interval:  212 QRS Duration: 128 QT Interval:  406 QTC Calculation: 454 R Axis:   -59 Text Interpretation: Sinus rhythm Borderline prolonged PR interval Left bundle branch block Confirmed by Noemi Chapel [phone]) on 04/28/2021 6:41:37  PM  Radiology DG Chest 2 View  Result Date: 04/28/2021 CLINICAL DATA:  Status post fall, found on floor. EXAM: CHEST - 2 VIEW COMPARISON:  04/16/2021 FINDINGS: No focal consolidation. No pleural effusion or pneumothorax. Heart and mediastinal contours are unremarkable. No acute osseous abnormality. Plate and screw fixation of the sternum. IMPRESSION: No active cardiopulmonary disease. Electronically Signed   By: Kathreen Devoid M.D.   On: 04/28/2021 13:29   CT Head Wo Contrast  Result Date: 04/28/2021 CLINICAL DATA:  Syncope, fall EXAM: CT HEAD WITHOUT CONTRAST TECHNIQUE: Contiguous axial images were obtained from the base of the skull through the vertex without intravenous contrast. RADIATION DOSE REDUCTION: This exam was performed according to the departmental dose-optimization program which includes automated exposure control, adjustment of the mA and/or kV according to patient size and/or use of iterative reconstruction technique. COMPARISON:  04/08/2013 FINDINGS: Brain: No acute intracranial findings are seen. Cortical sulci are prominent. There is no focal edema or mass effect. Vascular: Unremarkable. Skull: No fracture is seen. Sinuses/Orbits: Unremarkable. Other: None IMPRESSION: No acute intracranial findings are seen in noncontrast CT brain. Electronically Signed   By: Elmer Picker M.D.   On: 04/28/2021 17:32    Procedures Procedures    Medications Ordered in ED Medications - No data to display  ED Course/ Medical Decision Making/ A&P                            Medical Decision Making Patient here for evaluation of a syncopal episode that occurred earlier today.  No preceding events.  States that she was talking on the telephone when she passed out.  Episode witnessed by her spouse.  She recalls the events.  No head injury or LOC.  Was started on new antihypertensive medication on 04/15/2021.   On exam, vital signs reviewed.  Patient well-appearing nontoxic.  States that she is feeling better and back to her baseline.  No focal neurodeficits on my exam.  Amount and/or Complexity of Data Reviewed Independent Historian: spouse    Details: Spouse states no loss of consciousness no confusion and syncopal episode reported is brief. External Data Reviewed: labs.    Details: . Labs: ordered.    Details: Labs interpreted by me, no evidence of leukocytosis.  Hemoglobin unremarkable.  Chemistry show blood sugar of 126.  BUN 26 with serum creatinine of 0.82.  Delta troponin unchanged.  EKG shows sinus rhythm with prolonged PR interval and left bundle branch block Radiology: ordered.    Details: Imaging shows chest x-ray without evidence of cardiopulmonary process.  CT of the head was obtained and no radiographic evidence of no acute intracranial findings.  I have reviewed radiology imaging and agree with radiology interpretation. ECG/medicine tests: ordered. Decision-making details documented in ED Course.   Patient here for evaluation of syncopal episode that occurred earlier today.  States that she is feeling better and now back to her neurologic baseline.  She is ambulated in the department with steady gait.  No focal neurodeficits.  He denies any preceding events.  No chest pain or palpitations.  No dyspnea.  Etiology of patient's syncopal event is unclear.  This may have been related to the recent addition of amlodipine.  Doubtful of cardiac event.  No orthostatic hypotension.  CT of the head reassuring.  Will observe patient in the department for  complications.  On recheck, patient resting comfortably.  Denies any additional symptoms.  She has ambulated in the  department without difficulty.  I feel patient is appropriate for discharge home.  Given questionable history of paroxysmal A-fib feel that she would benefit from seeing cardiology.  She is agreeable to this plan.  Strict return precautions were discussed.          Final Clinical Impression(s) / ED Diagnoses Final diagnoses:  Syncope    Rx / DC Orders ED Discharge Orders     None         Bufford Lope 04/28/21 2309    Noemi Chapel, MD 04/29/21 1020

## 2021-04-28 NOTE — ED Notes (Signed)
Pt OOB to bedside commode.

## 2021-04-28 NOTE — ED Triage Notes (Signed)
Pt report she had a syncopal episode this morning while standing in kitchen. Pt fell on to left side onto floor. Pt reports she loc was for a few minutes and she laid on floor for a few minutes and then husband was able to help her up. Pt had bp med change two weeks ago.

## 2021-04-28 NOTE — Discharge Instructions (Addendum)
As discussed, you may call the cardiology group listed on Monday morning to arrange a follow-up appointment.  Someone from the office may also call you to schedule the appointment.  Continue to monitor your blood pressure and take your medications daily as directed.  Return to the emergency department for any new or worsening symptoms.

## 2021-05-08 DIAGNOSIS — M5416 Radiculopathy, lumbar region: Secondary | ICD-10-CM | POA: Diagnosis not present

## 2021-05-08 DIAGNOSIS — M48061 Spinal stenosis, lumbar region without neurogenic claudication: Secondary | ICD-10-CM | POA: Diagnosis not present

## 2021-05-12 ENCOUNTER — Other Ambulatory Visit: Payer: Self-pay

## 2021-05-12 ENCOUNTER — Telehealth: Payer: Self-pay | Admitting: Cardiology

## 2021-05-12 ENCOUNTER — Ambulatory Visit: Payer: Medicare HMO | Admitting: Cardiology

## 2021-05-12 ENCOUNTER — Encounter: Payer: Self-pay | Admitting: Cardiology

## 2021-05-12 VITALS — BP 137/81 | HR 64 | Ht 62.0 in | Wt 146.6 lb

## 2021-05-12 DIAGNOSIS — R55 Syncope and collapse: Secondary | ICD-10-CM

## 2021-05-12 DIAGNOSIS — I4891 Unspecified atrial fibrillation: Secondary | ICD-10-CM

## 2021-05-12 NOTE — Telephone Encounter (Signed)
PERCERT:  30 day monitor-Proventice dx: syncope

## 2021-05-12 NOTE — Patient Instructions (Signed)
Medication Instructions:  Your physician has recommended you make the following change in your medication:  Stop Nifedipine Continue other medications as directed  Labwork: none  Testing/Procedures: Your physician has recommended that you wear an event monitor. Event monitors are medical devices that record the hearts electrical activity. Doctors most often [place] these monitors to diagnose arrhythmias. Arrhythmias are problems with the speed or rhythm of the heartbeat. The monitor is a small, portable device. You can wear one while you do your normal daily activities. This is usually used to diagnose what is causing palpitations/syncope (passing out).   Follow-Up: Your physician recommends that you schedule a follow-up appointment in: 2 months  Any Other Special Instructions Will Be Listed Below (If Applicable).  If you need a refill on your cardiac medications before your next appointment, please call your pharmacy.

## 2021-05-12 NOTE — Progress Notes (Signed)
Clinical Summary Ms. Stipe is a 81 y.o.female seen today for follow up of the following medical problems.    1.Syncope - seen in ER 04/28/2021   - was in kitchen at counter on the phone. No prior issues day leading up - lost her hearing, felt dizzy and fell to floor. +LOC. Next thing she remembers family was trying  -felt like she needed to have a bowel movement, bowel incontinence - ER visit week prior for HTN. Started on norvasc    - ct head no acute process - ekg sr lbbb, prior EKG SR with first degree av block, LBBB -orthostatics vitals negative here - water x 5-6 glasses.     Prior records   2. Parox afib - from Dr Earline Mayotte notes thought to have been one isolated episode that occurred in the setting of high physical activity, excessive heat, and being on prednisone several years ago. Was followed clinically with no clear evidence of recurrence - during stress test 12/2013 recurrent episode of afib during exercise stress test - no recent palpitations - we discussed anticoag at last visit however with her upcoming surgeries she elected to stay on ASA.      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   HTN (hypertension)    Low TSH level 01/17/2010   Neuromuscular disorder (HCC)    Paroxysmal atrial fibrillation (HCC)    normal echiocardiogram and stress nuclear; low TSH with normal T3 and T4    PONV (postoperative nausea and vomiting)      Allergies  Allergen Reactions   Penicillins Other (See Comments)    Has patient had a PCN reaction causing immediate rash, facial/tongue/throat swelling, SOB or lightheadedness with hypotension: No Has patient had a PCN reaction causing severe rash involving mucus membranes or  skin necrosis: No Has patient had a PCN reaction that required hospitalization: No Has patient had a PCN reaction occurring within the last 10 years: No If all of the above answers are "NO", then may proceed with Cephalosporin use.    Unknown "weird tingling feeling in legs" Has patient had a PCN reaction causing immediate rash, facial/tongue/throat swelling, SOB or lightheadedness with hypotension: No Has patient had a PCN reaction causing severe rash involving mucus membranes or skin necrosis: No Has patient had a PCN reaction that required hospitalization: No Has patient had a PCN reaction occurring within the last 10 years: No If all of the above answers are "NO", then may proceed with Cephalosporin use.    Unknown "weird tingling feeling in legs" Has patient had a PCN reaction causing immediate rash, facial/tongue/throat swelling, SOB or lightheadedness with hypotension: No Has patient had a PCN reaction causing severe rash involving mucus membranes or skin necrosis: No Has patient had a PCN reaction that required hospitalization: No Has patient had a PCN reaction occurring within the last 10 years: No If all of the above answers are "NO", then may proceed with Cephalosporin use.    Unknown "weird tingling feeling in legs"   Sulfur Other (See Comments)   Sulfonamide Derivatives Other (See Comments)    Leg Pain     Current Outpatient Medications  Medication Sig Dispense Refill   amLODipine (NORVASC) 5 MG  tablet Take 1 tablet (5 mg total) by mouth daily. 30 tablet 0   aspirin EC 81 MG tablet Take 81 mg by mouth daily.     famotidine (PEPCID) 20 MG tablet Take 20 mg by mouth at bedtime.     ipratropium (ATROVENT) 0.06 % nasal spray Place 2 sprays into both nostrils 2 (two) times daily.     levothyroxine (SYNTHROID) 75 MCG tablet Take by mouth.     lisinopril (ZESTRIL) 40 MG tablet Take 40 mg by mouth daily.      metoprolol tartrate (LOPRESSOR) 25 MG tablet Take 1 tablet (25  mg total) by mouth 2 (two) times daily. 180 tablet 1   montelukast (SINGULAIR) 10 MG tablet Take 1 tablet by mouth at bedtime.     Multiple Vitamins-Minerals (HAIR/SKIN/NAILS PO) Take 1 tablet by mouth every morning.      Omega-3 Fatty Acids (OMEGA-3 2100 PO) Take 1 capsule by mouth every morning.      omeprazole (PRILOSEC) 20 MG capsule Take 20 mg by mouth 2 (two) times daily before a meal.   3   potassium chloride (K-DUR) 10 MEQ tablet Take 10 mEq by mouth 2 (two) times daily.      zinc gluconate 50 MG tablet Take 50 mg by mouth daily.     No current facility-administered medications for this visit.     Past Surgical History:  Procedure Laterality Date   ABDOMINAL HYSTERECTOMY     w/o oophorectomy   APPENDECTOMY     BUNIONECTOMY     bilateral   CHOLECYSTECTOMY     colonoscopy  03/09/15   COLONOSCOPY N/A 03/09/2015   Procedure: COLONOSCOPY;  Surgeon: Rogene Houston, MD;  Location: [facility];  Service: Endoscopy;  Laterality: N/A;  830   ESOPHAGOGASTRODUODENOSCOPY  12/27/2011   Procedure: ESOPHAGOGASTRODUODENOSCOPY (EGD);  Surgeon: Rogene Houston, MD;  Location: [facility];  Service: Endoscopy;  Laterality: N/A;  250   HEMORRHOID SURGERY N/A 06/20/2015   Procedure: HEMORRHOIDECTOMY;  Surgeon: Aviva Signs, MD;  Location: [facility];  Service: General;  Laterality: N/A;   PAROTIDECTOMY Right 11/16/2014   Procedure: RIGHT PAROTIDECTOMY;  Surgeon: Leta Baptist, MD;  Location: [facility];  Service: ENT;  Laterality: Right;   RESECTION OF MEDIASTINAL MASS N/A 10/01/2014   Procedure: RESECTION OF MEDIASTINAL MASS;  Surgeon: Grace Isaac, MD;  Location: [facility];  Service: Thoracic;  Laterality: N/A;   STERNOTOMY N/A 10/01/2014   Procedure: STERNOTOMY;  Surgeon: Grace Isaac, MD;  Location: [facility];  Service: Thoracic;  Laterality: N/A;   TONSILLECTOMY     TONSILLECTOMY     VEIN LIGATION AND STRIPPING       Allergies  Allergen Reactions   Penicillins Other (See  Comments)    Has patient had a PCN reaction causing immediate rash, facial/tongue/throat swelling, SOB or lightheadedness with hypotension: No Has patient had a PCN reaction causing severe rash involving mucus membranes or skin necrosis: No Has patient had a PCN reaction that required hospitalization: No Has patient had a PCN reaction occurring within the last 10 years: No If all of the above answers are "NO", then may proceed with Cephalosporin use.    Unknown "weird tingling feeling in legs" Has patient had a PCN reaction causing immediate rash, facial/tongue/throat swelling, SOB or lightheadedness with hypotension: No Has patient had a PCN reaction causing severe rash involving mucus membranes or skin necrosis: No Has patient had a PCN reaction that required hospitalization: No Has  patient had a PCN reaction occurring within the last 10 years: No If all of the above answers are "NO", then may proceed with Cephalosporin use.    Unknown "weird tingling feeling in legs" Has patient had a PCN reaction causing immediate rash, facial/tongue/throat swelling, SOB or lightheadedness with hypotension: No Has patient had a PCN reaction causing severe rash involving mucus membranes or skin necrosis: No Has patient had a PCN reaction that required hospitalization: No Has patient had a PCN reaction occurring within the last 10 years: No If all of the above answers are "NO", then may proceed with Cephalosporin use.    Unknown "weird tingling feeling in legs"   Sulfur Other (See Comments)   Sulfonamide Derivatives Other (See Comments)    Leg Pain      Family History  Problem Relation Age of Onset   Heart disease Father    Hypertension Father    Diabetes Mother    Hypertension Mother      Social History Ms. Volante reports that she has never smoked. She has never used smokeless tobacco. Ms. Almanzar reports current alcohol use.   Review of Systems CONSTITUTIONAL: No weight loss,  fever, chills, weakness or fatigue.  HEENT: Eyes: No visual loss, blurred vision, double vision or yellow sclerae.No hearing loss, sneezing, congestion, runny nose or sore throat.  SKIN: No rash or itching.  CARDIOVASCULAR: per hpi RESPIRATORY: No shortness of breath, cough or sputum.  GASTROINTESTINAL: No anorexia, nausea, vomiting or diarrhea. No abdominal pain or blood.  GENITOURINARY: No burning on urination, no polyuria NEUROLOGICAL: No headache, dizziness, syncope, paralysis, ataxia, numbness or tingling in the extremities. No change in bowel or bladder control.  MUSCULOSKELETAL: No muscle, back pain, joint pain or stiffness.  LYMPHATICS: No enlarged nodes. No history of splenectomy.  PSYCHIATRIC: No history of depression or anxiety.  ENDOCRINOLOGIC: No reports of sweating, cold or heat intolerance. No polyuria or polydipsia.  Marland Kitchen   Physical Examination Today's Vitals   05/12/21 1432 05/12/21 1433 05/12/21 1434 05/12/21 1436  BP: 130/77 125/75 122/75 137/81  Pulse: 60 65 64 64  SpO2:  98%    Weight:      Height:       Body mass index is 26.81 kg/m.  Gen: resting comfortably, no acute distress HEENT: no scleral icterus, pupils equal round and reactive, no palptable cervical adenopathy,  CV: RRR, no m/r/g no jvd Resp: Clear to auscultation bilaterally GI: abdomen is soft, non-tender, non-distended, normal bowel sounds, no hepatosplenomegaly MSK: extremities are warm, no edema.  Skin: warm, no rash Neuro:  no focal deficits Psych: appropriate affect   Diagnostic Studies  12/2013 MPI IMPRESSION: 1. Probable soft tissue attenuation affecting the inferior/inferolateral wall without definitive evidence of ischemia.   2. No focal LV wall motion abnormalities.   3. Left ventricular ejection fraction 65%   4. Transient atrial fibrillation noted during exercise, associated with chest discomfort as well as PVCs versus aberrently conducted beats.   5. Low-risk stress test  findings*.   Assessment and Plan   1. Syncope - unclear etiolgy - she does have LBBB and 1st degree av block on EKG. WIll plan for 30 day monitor - other possible cause is vasovagal, did have strong sense to defecate after coming to.    2. Afib - had refuses anticoag in the past - f/u monitor results, perhaps discus again pending afib burden.    Arnoldo Lenis, M.D.

## 2021-05-22 DIAGNOSIS — R55 Syncope and collapse: Secondary | ICD-10-CM | POA: Diagnosis not present

## 2021-05-24 ENCOUNTER — Ambulatory Visit (INDEPENDENT_AMBULATORY_CARE_PROVIDER_SITE_OTHER): Payer: Medicare HMO

## 2021-05-24 DIAGNOSIS — R55 Syncope and collapse: Secondary | ICD-10-CM

## 2021-06-07 DIAGNOSIS — M48061 Spinal stenosis, lumbar region without neurogenic claudication: Secondary | ICD-10-CM | POA: Diagnosis not present

## 2021-06-07 DIAGNOSIS — M5136 Other intervertebral disc degeneration, lumbar region: Secondary | ICD-10-CM | POA: Diagnosis not present

## 2021-06-07 DIAGNOSIS — M4726 Other spondylosis with radiculopathy, lumbar region: Secondary | ICD-10-CM | POA: Diagnosis not present

## 2021-07-03 DIAGNOSIS — M48061 Spinal stenosis, lumbar region without neurogenic claudication: Secondary | ICD-10-CM | POA: Diagnosis not present

## 2021-07-03 DIAGNOSIS — M5416 Radiculopathy, lumbar region: Secondary | ICD-10-CM | POA: Diagnosis not present

## 2021-07-13 ENCOUNTER — Encounter: Payer: Self-pay | Admitting: Cardiology

## 2021-07-13 ENCOUNTER — Ambulatory Visit: Payer: Medicare HMO | Admitting: Cardiology

## 2021-07-13 VITALS — BP 118/78 | HR 63 | Ht 62.0 in | Wt 149.0 lb

## 2021-07-13 DIAGNOSIS — I10 Essential (primary) hypertension: Secondary | ICD-10-CM | POA: Diagnosis not present

## 2021-07-13 DIAGNOSIS — R55 Syncope and collapse: Secondary | ICD-10-CM | POA: Diagnosis not present

## 2021-07-13 DIAGNOSIS — I4891 Unspecified atrial fibrillation: Secondary | ICD-10-CM

## 2021-07-13 NOTE — Progress Notes (Signed)
? ? ? ?Clinical Summary ?[name] is a 81 y.o.female seen today for follow up of the following medical problems.  ?  ?  ?1.Syncope ?- seen in ER 04/28/2021 ?  ?  ?- was in kitchen at counter on the phone. No prior issues day leading up ?- lost her hearing, felt dizzy and fell to floor. +LOC. Next thing she remembers family was trying to get her up ?-felt like she needed to have a bowel movement, bowel incontinence ?- ER visit week prior for HTN. Started on norvasc ? - ct head no acute process ?- ekg sr lbbb, prior EKG SR with first degree av block, LBBB ?-orthostatics vitals negative here ?- water x 5-6 glasses.  ?  ? ?05/2021 30 day monitor was benign.  ?- no recurrent episodes since last visit ? ? ? ?2. HTN ?- recent LE over the last few months ?- started on norvasc recently.  ?- home bp's 120s-130s/70s ?  ?  ?Prior records ?  ?2. Parox afib ?- from Dr Earline Mayotte notes thought to have been one isolated episode that occurred in the setting of high physical activity, excessive heat, and being on prednisone several years ago. Was followed clinically with no clear evidence of recurrence ?- during stress test 12/2013 recurrent episode of afib during exercise stress test ?- no recent palpitations ?- we discussed anticoag at last visit however with her upcoming surgeries she elected to stay on ASA.  ?  ?  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 Bulging lumbar disc   ? DJD (degenerative joint disease)   ? 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 HTN (hypertension)   ? Low TSH level 01/17/2010  ? Neuromuscular disorder ([place])   ? Paroxysmal atrial fibrillation (HCC)   ? normal echiocardiogram and stress nuclear; low TSH with normal T3 and T4   ? PONV (postoperative nausea and vomiting)   ? ? ? ?Allergies  ?Allergen Reactions  ? Penicillins Other (See Comments)  ?  Has  patient had a PCN reaction causing immediate rash, facial/tongue/throat swelling, SOB or lightheadedness with hypotension: No ?Has patient had a PCN reaction causing severe rash involving mucus membranes or skin necrosis: No ?Has patient had a PCN reaction that required hospitalization: No ?Has patient had a PCN reaction occurring within the last 10 years: No ?If all of the above answers are "NO", then may proceed with Cephalosporin use. ? ? ? ?Unknown "weird tingling feeling in legs" ?Has patient had a PCN reaction causing immediate rash, facial/tongue/throat swelling, SOB or lightheadedness with hypotension: No ?Has patient had a PCN reaction causing severe rash involving mucus membranes or skin necrosis: No ?Has patient had a PCN reaction that required hospitalization: No ?Has patient had a PCN reaction occurring within the last 10 years: No ?If all of the above answers are "NO", then may proceed with Cephalosporin use. ? ? ? ?Unknown "weird tingling feeling in legs" ?Has patient had a PCN reaction causing immediate rash, facial/tongue/throat swelling, SOB or lightheadedness with hypotension: No ?Has patient had a PCN reaction causing severe rash involving mucus membranes or skin necrosis: No ?Has patient had a PCN reaction that required hospitalization: No ?Has patient had a PCN reaction occurring within the last 10 years: No ?If all of the above answers are "NO", then may proceed with Cephalosporin use. ? ? ? ?  Unknown "weird tingling feeling in legs"  ? Sulfur Other (See Comments)  ? Sulfonamide Derivatives Other (See Comments)  ?  Leg Pain  ? ? ? ?Current Outpatient Medications  ?Medication Sig Dispense Refill  ? amLODipine (NORVASC) 5 MG tablet Take 1 tablet (5 mg total) by mouth daily. 30 tablet 0  ? aspirin EC 81 MG tablet Take 81 mg by mouth daily.    ? famotidine (PEPCID) 20 MG tablet Take 20 mg by mouth at bedtime.    ? gabapentin (NEURONTIN) 100 MG capsule Take 100 mg by mouth 2 (two) times daily.    ?  ipratropium (ATROVENT) 0.06 % nasal spray Place 2 sprays into both nostrils 2 (two) times daily.    ? levothyroxine (SYNTHROID) 75 MCG tablet Take 75 mcg by mouth daily before breakfast.    ? lisinopril (ZESTRIL) 40 MG tablet Take 40 mg by mouth daily.     ? metoprolol tartrate (LOPRESSOR) 25 MG tablet Take 1 tablet (25 mg total) by mouth 2 (two) times daily. 180 tablet 1  ? montelukast (SINGULAIR) 10 MG tablet Take 1 tablet by mouth at bedtime.    ? Multiple Vitamins-Minerals (HAIR/SKIN/NAILS PO) Take 1 tablet by mouth every morning.     ? Omega-3 Fatty Acids (OMEGA-3 2100 PO) Take 1 capsule by mouth every morning.     ? omeprazole (PRILOSEC) 20 MG capsule Take 20 mg by mouth 2 (two) times daily before a meal.   3  ? oxybutynin (DITROPAN) 5 MG tablet Take 5 mg by mouth daily as needed for bladder spasms.    ? Polyethyl Glycol-Propyl Glycol (SYSTANE) 0.4-0.3 % SOLN Apply 1 drop to eye as needed.    ? potassium chloride (K-DUR) 10 MEQ tablet Take 10 mEq by mouth 2 (two) times daily.     ? Probiotic Product (PROBIOTIC-10 PO) Take 1 capsule by mouth daily.    ? zinc gluconate 50 MG tablet Take 50 mg by mouth daily.    ? ?No current facility-administered medications for this visit.  ? ? ? ?Past Surgical History:  ?Procedure Laterality Date  ? ABDOMINAL HYSTERECTOMY    ? w/o oophorectomy  ? APPENDECTOMY    ? BUNIONECTOMY    ? bilateral  ? CHOLECYSTECTOMY    ? colonoscopy  03/09/15  ? COLONOSCOPY N/A 03/09/2015  ? Procedure: COLONOSCOPY;  Surgeon: Rogene Houston, MD;  [facility];  Service: Endoscopy;  Laterality: N/A;  830  ? ESOPHAGOGASTRODUODENOSCOPY  12/27/2011  ? Procedure: ESOPHAGOGASTRODUODENOSCOPY (EGD);  Surgeon: Rogene Houston, MD;  [facility];  Service: Endoscopy;  Laterality: N/A;  250  ? HEMORRHOID SURGERY N/A 06/20/2015  ? Procedure: HEMORRHOIDECTOMY;  Surgeon: Aviva Signs, MD;  Location: [facility];  Service: General;  Laterality: N/A;  ? PAROTIDECTOMY Right 11/16/2014  ? Procedure:  RIGHT PAROTIDECTOMY;  Surgeon: Leta Baptist, MD;  Location: [place];  Service: ENT;  Laterality: Right;  ? RESECTION OF MEDIASTINAL MASS N/A 10/01/2014  ? Procedure: RESECTION OF MEDIASTINAL MASS;  Surgeon: Grace Isaac, MD;  Location: [place];  Service: Thoracic;  Laterality: N/A;  ? STERNOTOMY N/A 10/01/2014  ? Procedure: STERNOTOMY;  Surgeon: Grace Isaac, MD;  Location: [place];  Service: Thoracic;  Laterality: N/A;  ? TONSILLECTOMY    ? TONSILLECTOMY    ? VEIN LIGATION AND STRIPPING    ? ? ? ?Allergies  ?Allergen Reactions  ? Penicillins Other (See Comments)  ?  Has patient had a PCN reaction  causing immediate rash, facial/tongue/throat swelling, SOB or lightheadedness with hypotension: No ?Has patient had a PCN reaction causing severe rash involving mucus membranes or skin necrosis: No ?Has patient had a PCN reaction that required hospitalization: No ?Has patient had a PCN reaction occurring within the last 10 years: No ?If all of the above answers are "NO", then may proceed with Cephalosporin use. ? ? ? ?Unknown "weird tingling feeling in legs" ?Has patient had a PCN reaction causing immediate rash, facial/tongue/throat swelling, SOB or lightheadedness with hypotension: No ?Has patient had a PCN reaction causing severe rash involving mucus membranes or skin necrosis: No ?Has patient had a PCN reaction that required hospitalization: No ?Has patient had a PCN reaction occurring within the last 10 years: No ?If all of the above answers are "NO", then may proceed with Cephalosporin use. ? ? ? ?Unknown "weird tingling feeling in legs" ?Has patient had a PCN reaction causing immediate rash, facial/tongue/throat swelling, SOB or lightheadedness with hypotension: No ?Has patient had a PCN reaction causing severe rash involving mucus membranes or skin necrosis: No ?Has patient had a PCN reaction that required hospitalization: No ?Has patient had a PCN reaction occurring within the last 10 years:  No ?If all of the above answers are "NO", then may proceed with Cephalosporin use. ? ? ? ?Unknown "weird tingling feeling in legs"  ? Sulfur Other (See Comments)  ? Sulfonamide Derivatives Other (See Comments)

## 2021-07-13 NOTE — Patient Instructions (Addendum)
Medication Instructions:  ?Stop Norvasc (Amlodipine) ?Continue all other medications.    ? ?Labwork: ?none ? ?Testing/Procedures: ?none ? ?Follow-Up: ?6 months  ? ?Any Other Special Instructions Will Be Listed Below (If Applicable). ?Please call the office / mychart message in 1 week with update on BP  ? ?If you need a refill on your cardiac medications before your next appointment, please call your pharmacy. ? ?

## 2021-07-26 DIAGNOSIS — M461 Sacroiliitis, not elsewhere classified: Secondary | ICD-10-CM | POA: Diagnosis not present

## 2021-08-21 DIAGNOSIS — K219 Gastro-esophageal reflux disease without esophagitis: Secondary | ICD-10-CM | POA: Diagnosis not present

## 2021-08-21 DIAGNOSIS — R070 Pain in throat: Secondary | ICD-10-CM | POA: Diagnosis not present

## 2021-08-22 DIAGNOSIS — D239 Other benign neoplasm of skin, unspecified: Secondary | ICD-10-CM | POA: Diagnosis not present

## 2021-08-22 DIAGNOSIS — C44319 Basal cell carcinoma of skin of other parts of face: Secondary | ICD-10-CM | POA: Diagnosis not present

## 2021-08-22 DIAGNOSIS — Z1283 Encounter for screening for malignant neoplasm of skin: Secondary | ICD-10-CM | POA: Diagnosis not present

## 2021-08-22 DIAGNOSIS — D485 Neoplasm of uncertain behavior of skin: Secondary | ICD-10-CM | POA: Diagnosis not present

## 2021-08-29 DIAGNOSIS — M4316 Spondylolisthesis, lumbar region: Secondary | ICD-10-CM | POA: Diagnosis not present

## 2021-08-29 DIAGNOSIS — M47816 Spondylosis without myelopathy or radiculopathy, lumbar region: Secondary | ICD-10-CM | POA: Diagnosis not present

## 2021-08-29 DIAGNOSIS — M48061 Spinal stenosis, lumbar region without neurogenic claudication: Secondary | ICD-10-CM | POA: Diagnosis not present

## 2021-08-31 DIAGNOSIS — C44319 Basal cell carcinoma of skin of other parts of face: Secondary | ICD-10-CM | POA: Diagnosis not present

## 2021-09-25 DIAGNOSIS — R7302 Impaired glucose tolerance (oral): Secondary | ICD-10-CM | POA: Diagnosis not present

## 2021-09-25 DIAGNOSIS — M47816 Spondylosis without myelopathy or radiculopathy, lumbar region: Secondary | ICD-10-CM | POA: Diagnosis not present

## 2021-09-25 DIAGNOSIS — Z0000 Encounter for general adult medical examination without abnormal findings: Secondary | ICD-10-CM | POA: Diagnosis not present

## 2021-09-25 DIAGNOSIS — Z136 Encounter for screening for cardiovascular disorders: Secondary | ICD-10-CM | POA: Diagnosis not present

## 2021-09-25 DIAGNOSIS — Z13220 Encounter for screening for lipoid disorders: Secondary | ICD-10-CM | POA: Diagnosis not present

## 2021-09-25 DIAGNOSIS — M48061 Spinal stenosis, lumbar region without neurogenic claudication: Secondary | ICD-10-CM | POA: Diagnosis not present

## 2021-09-25 DIAGNOSIS — E039 Hypothyroidism, unspecified: Secondary | ICD-10-CM | POA: Diagnosis not present

## 2021-10-18 DIAGNOSIS — M5459 Other low back pain: Secondary | ICD-10-CM | POA: Diagnosis not present

## 2021-10-18 DIAGNOSIS — M47816 Spondylosis without myelopathy or radiculopathy, lumbar region: Secondary | ICD-10-CM | POA: Diagnosis not present

## 2021-11-24 DIAGNOSIS — M47816 Spondylosis without myelopathy or radiculopathy, lumbar region: Secondary | ICD-10-CM | POA: Diagnosis not present

## 2021-12-27 DIAGNOSIS — M47816 Spondylosis without myelopathy or radiculopathy, lumbar region: Secondary | ICD-10-CM | POA: Diagnosis not present

## 2021-12-29 DIAGNOSIS — M47816 Spondylosis without myelopathy or radiculopathy, lumbar region: Secondary | ICD-10-CM | POA: Diagnosis not present

## 2022-01-15 DIAGNOSIS — L570 Actinic keratosis: Secondary | ICD-10-CM | POA: Diagnosis not present

## 2022-01-15 DIAGNOSIS — D239 Other benign neoplasm of skin, unspecified: Secondary | ICD-10-CM | POA: Diagnosis not present

## 2022-01-15 DIAGNOSIS — Z1283 Encounter for screening for malignant neoplasm of skin: Secondary | ICD-10-CM | POA: Diagnosis not present

## 2022-01-15 DIAGNOSIS — Z85828 Personal history of other malignant neoplasm of skin: Secondary | ICD-10-CM | POA: Diagnosis not present

## 2022-01-23 ENCOUNTER — Ambulatory Visit: Payer: Medicare HMO | Attending: Cardiology | Admitting: Cardiology

## 2022-01-23 VITALS — BP 124/80 | HR 60 | Ht 62.0 in | Wt 146.4 lb

## 2022-01-23 DIAGNOSIS — I10 Essential (primary) hypertension: Secondary | ICD-10-CM | POA: Diagnosis not present

## 2022-01-23 DIAGNOSIS — R55 Syncope and collapse: Secondary | ICD-10-CM

## 2022-01-23 NOTE — Patient Instructions (Addendum)

## 2022-01-23 NOTE — Progress Notes (Signed)
Clinical Summary Ms. Yerkes is a 81 y.o.female seen today for follow up of the following medical problems.      1.Syncope - seen in ER 04/28/2021     - was in kitchen at counter on the phone. No prior issues day leading up - lost her hearing, felt dizzy and fell to floor. +LOC. Next thing she remembers family was trying to get her up -felt like she needed to have a bowel movement, bowel incontinence - ER visit week prior for HTN. Started on norvasc  - ct head no acute process - ekg sr lbbb, prior EKG SR with first degree av block, LBBB -orthostatics vitals negative here - water x 5-6 glasses.      05/2021 30 day monitor was benign.  - no recurrent episodes since last visit   - no recent symptoms.        2. HTN - recent LE over the last few months - started on norvasc recently.    - home bp's 120-130s/60s-80s - last visit she had noted some leg swelling, we had talked about holding norvasc for a period however she has continued takign and denies ongoign issues with edema  3. Aortic atherosclerosis -she is on ASA 81    Prior records   2. Parox afib - from Dr Earline Mayotte notes thought to have been one isolated episode that occurred in the setting of high physical activity, excessive heat, and being on prednisone several years ago. Was followed clinically with no clear evidence of recurrence - during stress test 12/2013 recurrent episode of afib during exercise stress test - no recent palpitations - we discussed anticoag at last visit however with her upcoming surgeries she elected to stay on ASA.   - no recent palpitations.      2. 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   HTN (hypertension)    Low TSH level 01/17/2010    Neuromuscular disorder (HCC)    Paroxysmal atrial fibrillation (HCC)    normal echiocardiogram and stress nuclear; low TSH with normal T3 and T4    PONV (postoperative nausea and vomiting)      Allergies  Allergen Reactions   Penicillins Other (See Comments)    Has patient had a PCN reaction causing immediate rash, facial/tongue/throat swelling, SOB or lightheadedness with hypotension: No Has patient had a PCN reaction causing severe rash involving mucus membranes or skin necrosis: No Has patient had a PCN reaction that required hospitalization: No Has patient had a PCN reaction occurring within the last 10 years: No If all of the above answers are "NO", then may proceed with Cephalosporin use.    Unknown "weird tingling feeling in legs" Has patient had a PCN reaction causing immediate rash, facial/tongue/throat swelling, SOB or lightheadedness with hypotension: No Has patient had a PCN reaction causing severe rash involving mucus membranes or skin necrosis: No Has patient had a PCN reaction that required hospitalization: No Has patient had a PCN reaction occurring within the last 10 years: No If all of the above answers are "NO", then may proceed with Cephalosporin use.    Unknown "weird tingling feeling in legs" Has patient had a PCN reaction causing immediate rash, facial/tongue/throat swelling, SOB or  lightheadedness with hypotension: No Has patient had a PCN reaction causing severe rash involving mucus membranes or skin necrosis: No Has patient had a PCN reaction that required hospitalization: No Has patient had a PCN reaction occurring within the last 10 years: No If all of the above answers are "NO", then may proceed with Cephalosporin use.    Unknown "weird tingling feeling in legs"   Sulfur Other (See Comments)   Sulfonamide Derivatives Other (See Comments)    Leg Pain     Current Outpatient Medications  Medication Sig Dispense Refill   aspirin EC 81 MG tablet  Take 81 mg by mouth daily.     famotidine (PEPCID) 20 MG tablet Take 20 mg by mouth at bedtime.     gabapentin (NEURONTIN) 100 MG capsule Take 100 mg by mouth 2 (two) times daily.     ipratropium (ATROVENT) 0.06 % nasal spray Place 2 sprays into both nostrils 2 (two) times daily.     levothyroxine (SYNTHROID) 75 MCG tablet Take 75 mcg by mouth daily before breakfast.     lisinopril (ZESTRIL) 40 MG tablet Take 40 mg by mouth daily.      metoprolol tartrate (LOPRESSOR) 25 MG tablet Take 1 tablet (25 mg total) by mouth 2 (two) times daily. 180 tablet 1   montelukast (SINGULAIR) 10 MG tablet Take 1 tablet by mouth at bedtime.     Multiple Vitamins-Minerals (HAIR/SKIN/NAILS PO) Take 1 tablet by mouth every morning.      Omega-3 Fatty Acids (OMEGA-3 2100 PO) Take 1 capsule by mouth every morning.      omeprazole (PRILOSEC) 20 MG capsule Take 20 mg by mouth 2 (two) times daily before a meal.   3   oxybutynin (DITROPAN) 5 MG tablet Take 5 mg by mouth daily as needed for bladder spasms.     Polyethyl Glycol-Propyl Glycol (SYSTANE) 0.4-0.3 % SOLN Apply 1 drop to eye as needed.     potassium chloride (K-DUR) 10 MEQ tablet Take 10 mEq by mouth 2 (two) times daily.      Probiotic Product (PROBIOTIC-10 PO) Take 1 capsule by mouth daily.     zinc gluconate 50 MG tablet Take 50 mg by mouth daily.     No current facility-administered medications for this visit.     Past Surgical History:  Procedure Laterality Date   ABDOMINAL HYSTERECTOMY     w/o oophorectomy   APPENDECTOMY     BUNIONECTOMY     bilateral   CHOLECYSTECTOMY     colonoscopy  03/09/15   COLONOSCOPY N/A 03/09/2015   Procedure: COLONOSCOPY;  Surgeon: Rogene Houston, MD;  [facility];  Service: Endoscopy;  Laterality: N/A;  830   ESOPHAGOGASTRODUODENOSCOPY  12/27/2011   Procedure: ESOPHAGOGASTRODUODENOSCOPY (EGD);  Surgeon: Rogene Houston, MD;  [facility];  Service: Endoscopy;  Laterality: N/A;  250   HEMORRHOID  SURGERY N/A 06/20/2015   Procedure: HEMORRHOIDECTOMY;  Surgeon: Aviva Signs, MD;  [facility];  Service: General;  Laterality: N/A;   PAROTIDECTOMY Right 11/16/2014   Procedure: RIGHT PAROTIDECTOMY;  Surgeon: Leta Baptist, MD;  Location: [place];  Service: ENT;  Laterality: Right;   RESECTION OF MEDIASTINAL MASS N/A 10/01/2014   Procedure: RESECTION OF MEDIASTINAL MASS;  Surgeon: Grace Isaac, MD;  Location: [place];  Service: Thoracic;  Laterality: N/A;   STERNOTOMY N/A 10/01/2014   Procedure: STERNOTOMY;  Surgeon: Grace Isaac, MD;  Location: [place];  Service: Thoracic;  Laterality: N/A;  TONSILLECTOMY     TONSILLECTOMY     VEIN LIGATION AND STRIPPING       Allergies  Allergen Reactions   Penicillins Other (See Comments)    Has patient had a PCN reaction causing immediate rash, facial/tongue/throat swelling, SOB or lightheadedness with hypotension: No Has patient had a PCN reaction causing severe rash involving mucus membranes or skin necrosis: No Has patient had a PCN reaction that required hospitalization: No Has patient had a PCN reaction occurring within the last 10 years: No If all of the above answers are "NO", then may proceed with Cephalosporin use.    Unknown "weird tingling feeling in legs" Has patient had a PCN reaction causing immediate rash, facial/tongue/throat swelling, SOB or lightheadedness with hypotension: No Has patient had a PCN reaction causing severe rash involving mucus membranes or skin necrosis: No Has patient had a PCN reaction that required hospitalization: No Has patient had a PCN reaction occurring within the last 10 years: No If all of the above answers are "NO", then may proceed with Cephalosporin use.    Unknown "weird tingling feeling in legs" Has patient had a PCN reaction causing immediate rash, facial/tongue/throat swelling, SOB or lightheadedness with hypotension: No Has patient had a PCN reaction causing severe rash  involving mucus membranes or skin necrosis: No Has patient had a PCN reaction that required hospitalization: No Has patient had a PCN reaction occurring within the last 10 years: No If all of the above answers are "NO", then may proceed with Cephalosporin use.    Unknown "weird tingling feeling in legs"   Sulfur Other (See Comments)   Sulfonamide Derivatives Other (See Comments)    Leg Pain      Family History  Problem Relation Age of Onset   Heart disease Father    Hypertension Father    Diabetes Mother    Hypertension Mother      Social History Ms. Nield reports that she has never smoked. She has never used smokeless tobacco. Ms. Tuch reports current alcohol use.   Review of Systems CONSTITUTIONAL: No weight loss, fever, chills, weakness or fatigue.  HEENT: Eyes: No visual loss, blurred vision, double vision or yellow sclerae.No hearing loss, sneezing, congestion, runny nose or sore throat.  SKIN: No rash or itching.  CARDIOVASCULAR: per hpi RESPIRATORY: No shortness of breath, cough or sputum.  GASTROINTESTINAL: No anorexia, nausea, vomiting or diarrhea. No abdominal pain or blood.  GENITOURINARY: No burning on urination, no polyuria NEUROLOGICAL: No headache, dizziness, syncope, paralysis, ataxia, numbness or tingling in the extremities. No change in bowel or bladder control.  MUSCULOSKELETAL: No muscle, back pain, joint pain or stiffness.  LYMPHATICS: No enlarged nodes. No history of splenectomy.  PSYCHIATRIC: No history of depression or anxiety.  ENDOCRINOLOGIC: No reports of sweating, cold or heat intolerance. No polyuria or polydipsia.  Marland Kitchen   Physical Examination Today's Vitals   01/23/22 1354  BP: 124/80  Pulse: 60  SpO2: 98%  Weight: 146 lb 6.4 oz (66.4 kg)  Height: '5\' 2"'$  (1.575 m)   Body mass index is 26.78 kg/m.;  Gen: resting comfortably, no acute distress HEENT: no scleral icterus, pupils equal round and reactive, no palptable cervical  adenopathy,  CV: RRR, no m/r/ gno jvd Resp: Clear to auscultation bilaterally GI: abdomen is soft, non-tender, non-distended, normal bowel sounds, no hepatosplenomegaly MSK: extremities are warm, no edema.  Skin: warm, no rash Neuro:  no focal deficits Psych: appropriate affect   Diagnostic Studies  12/2013 MPI IMPRESSION: 1.  Probable soft tissue attenuation affecting the inferior/inferolateral wall without definitive evidence of ischemia.   2. No focal LV wall motion abnormalities.   3. Left ventricular ejection fraction 65%   4. Transient atrial fibrillation noted during exercise, associated with chest discomfort as well as PVCs versus aberrently conducted beats.   5. Low-risk stress test findings*.     Assessment and Plan   1. Syncope - unclear etiolgy - she does have LBBB and 1st degree av block on EKG.  - 30 day monitor was benign - no recurrences, continue to monitor at this time.       2. HTN - at goal, continue current meds   F/u 1 year     Arnoldo Lenis, M.D.

## 2022-01-26 DIAGNOSIS — M5416 Radiculopathy, lumbar region: Secondary | ICD-10-CM | POA: Diagnosis not present

## 2022-01-26 DIAGNOSIS — M48061 Spinal stenosis, lumbar region without neurogenic claudication: Secondary | ICD-10-CM | POA: Diagnosis not present

## 2022-01-26 DIAGNOSIS — M4316 Spondylolisthesis, lumbar region: Secondary | ICD-10-CM | POA: Diagnosis not present

## 2022-01-26 DIAGNOSIS — M47816 Spondylosis without myelopathy or radiculopathy, lumbar region: Secondary | ICD-10-CM | POA: Diagnosis not present

## 2022-01-27 ENCOUNTER — Encounter (INDEPENDENT_AMBULATORY_CARE_PROVIDER_SITE_OTHER): Payer: Self-pay | Admitting: Gastroenterology

## 2022-02-20 DIAGNOSIS — R490 Dysphonia: Secondary | ICD-10-CM | POA: Diagnosis not present

## 2022-02-20 DIAGNOSIS — K219 Gastro-esophageal reflux disease without esophagitis: Secondary | ICD-10-CM | POA: Diagnosis not present

## 2022-02-20 DIAGNOSIS — H903 Sensorineural hearing loss, bilateral: Secondary | ICD-10-CM | POA: Diagnosis not present

## 2022-02-23 DIAGNOSIS — M5416 Radiculopathy, lumbar region: Secondary | ICD-10-CM | POA: Diagnosis not present

## 2022-02-28 ENCOUNTER — Other Ambulatory Visit (HOSPITAL_COMMUNITY): Payer: Self-pay | Admitting: Family Medicine

## 2022-02-28 DIAGNOSIS — Z1231 Encounter for screening mammogram for malignant neoplasm of breast: Secondary | ICD-10-CM

## 2022-03-16 DIAGNOSIS — M47816 Spondylosis without myelopathy or radiculopathy, lumbar region: Secondary | ICD-10-CM | POA: Diagnosis not present

## 2022-03-16 DIAGNOSIS — M5416 Radiculopathy, lumbar region: Secondary | ICD-10-CM | POA: Diagnosis not present

## 2022-04-02 ENCOUNTER — Ambulatory Visit (HOSPITAL_COMMUNITY)
Admission: RE | Admit: 2022-04-02 | Discharge: 2022-04-02 | Disposition: A | Discharge status: Home / Self Care | Payer: Medicare HMO | Source: Ambulatory Visit | Attending: Family Medicine | Admitting: Family Medicine

## 2022-04-02 DIAGNOSIS — Z1231 Encounter for screening mammogram for malignant neoplasm of breast: Secondary | ICD-10-CM | POA: Insufficient documentation

## 2022-04-03 ENCOUNTER — Inpatient Hospital Stay: Payer: Medicare HMO | Attending: Hematology

## 2022-04-03 DIAGNOSIS — Z8509 Personal history of malignant neoplasm of other digestive organs: Secondary | ICD-10-CM | POA: Diagnosis present

## 2022-04-03 DIAGNOSIS — Z08 Encounter for follow-up examination after completed treatment for malignant neoplasm: Secondary | ICD-10-CM | POA: Diagnosis present

## 2022-04-03 DIAGNOSIS — E039 Hypothyroidism, unspecified: Secondary | ICD-10-CM | POA: Diagnosis not present

## 2022-04-03 DIAGNOSIS — C07 Malignant neoplasm of parotid gland: Secondary | ICD-10-CM

## 2022-04-03 LAB — CBC WITH DIFFERENTIAL/PLATELET
Abs Immature Granulocytes: 0.01 10*3/uL (ref 0.00–0.07)
Basophils Absolute: 0.1 10*3/uL (ref 0.0–0.1)
Basophils Relative: 1 %
Eosinophils Absolute: 0.1 10*3/uL (ref 0.0–0.5)
Eosinophils Relative: 1 %
HCT: 37.3 % (ref 36.0–46.0)
Hemoglobin: 12.5 g/dL (ref 12.0–15.0)
Immature Granulocytes: 0 %
Lymphocytes Relative: 24 %
Lymphs Abs: 1.2 10*3/uL (ref 0.7–4.0)
MCH: 32 pg (ref 26.0–34.0)
MCHC: 33.5 g/dL (ref 30.0–36.0)
MCV: 95.4 fL (ref 80.0–100.0)
Monocytes Absolute: 0.5 10*3/uL (ref 0.1–1.0)
Monocytes Relative: 10 %
Neutro Abs: 3.1 10*3/uL (ref 1.7–7.7)
Neutrophils Relative %: 64 %
Platelets: 180 10*3/uL (ref 150–400)
RBC: 3.91 MIL/uL (ref 3.87–5.11)
RDW: 12.5 % (ref 11.5–15.5)
WBC: 4.9 10*3/uL (ref 4.0–10.5)
nRBC: 0 % (ref 0.0–0.2)

## 2022-04-03 LAB — COMPREHENSIVE METABOLIC PANEL
ALT: 15 U/L (ref 0–44)
AST: 18 U/L (ref 15–41)
Albumin: 3.6 g/dL (ref 3.5–5.0)
Alkaline Phosphatase: 60 U/L (ref 38–126)
Anion gap: 8 (ref 5–15)
BUN: 16 mg/dL (ref 8–23)
CO2: 25 mmol/L (ref 22–32)
Calcium: 8.6 mg/dL — ABNORMAL LOW (ref 8.9–10.3)
Chloride: 104 mmol/L (ref 98–111)
Creatinine, Ser: 0.76 mg/dL (ref 0.44–1.00)
GFR, Estimated: >60 mL/min (ref >60)
Glucose, Bld: 110 mg/dL — ABNORMAL HIGH (ref 70–99)
Potassium: 3.4 mmol/L — ABNORMAL LOW (ref 3.5–5.1)
Sodium: 137 mmol/L (ref 135–145)
Total Bilirubin: 0.7 mg/dL (ref 0.3–1.2)
Total Protein: 6.7 g/dL (ref 6.5–8.1)

## 2022-04-03 LAB — TSH: TSH: 2.586 u[IU]/mL (ref 0.350–4.500)

## 2022-04-10 ENCOUNTER — Inpatient Hospital Stay: Payer: Medicare HMO | Admitting: Hematology

## 2022-04-10 ENCOUNTER — Encounter: Payer: Self-pay | Admitting: Hematology

## 2022-04-10 VITALS — BP 148/81 | HR 68 | Temp 98.0°F | Resp 18 | Wt 149.9 lb

## 2022-04-10 DIAGNOSIS — Z08 Encounter for follow-up examination after completed treatment for malignant neoplasm: Secondary | ICD-10-CM | POA: Diagnosis not present

## 2022-04-10 DIAGNOSIS — C07 Malignant neoplasm of parotid gland: Secondary | ICD-10-CM

## 2022-04-10 NOTE — Patient Instructions (Signed)
[facility] at [facility] Discharge Instructions   You were seen and examined today by Dr. Delton Coombes.  He discussed with you continuing follow up with your primary care doctor.   See the dermatologist for the spot on your right ear.  We will see you back in our clinic as necessary. Otherwise, you do not need to follow up here.    Thank you for choosing [place] at [facility] to provide your oncology and hematology care.  To afford each patient quality time with our provider, please arrive at least 15 minutes before your scheduled appointment time.   If you have a lab appointment with the [facility] please come in thru the Main Entrance and check in at the main information desk.  You need to re-schedule your appointment should you arrive 10 or more minutes late.  We strive to give you quality time with our providers, and arriving late affects you and other patients whose appointments are after yours.  Also, if you no show three or more times for appointments you may be dismissed from the clinic at the providers discretion.     Again, thank you for choosing [facility].  Our hope is that these requests will decrease the amount of time that you wait before being seen by our physicians.       _____________________________________________________________  Should you have questions after your visit to [facility], please contact our office at [phone] and follow the prompts.  Our office hours are 8:00 a.m. and 4:30 p.m. Monday - Friday.  Please note that voicemails left after 4:00 p.m. may not be returned until the following business day.  We are closed weekends and major holidays.  You do have access to a nurse 24-7, just call the main number to the clinic [phone] and do not press any options, hold on the line and a nurse will answer the phone.    For prescription refill requests, have your pharmacy contact  our office and allow 72 hours.    Due to Covid, you will need to wear a mask upon entering the hospital. If you do not have a mask, a mask will be given to you at the Main Entrance upon arrival. For doctor visits, patients may have 1 support person age 33 or older with them. For treatment visits, patients can not have anyone with them due to social distancing guidelines and our immunocompromised population.

## 2022-04-10 NOTE — Progress Notes (Signed)
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 Parotid surgical resection on 11/16/2014. 2. Adjuvant radiation therapy from 12/30/2014 to 02/09/2015.  NGS Results: not done  CURRENT THERAPY: surveillance  BRIEF ONCOLOGIC HISTORY:  Oncology History  Malignant tumor of parotid gland ([place])  08/24/2014 Imaging   CT neck: 19 mm mass in superficial right parotid consistent with neoplasm.  Partially visualized anterior mediastinal mass, at least 7 cm; appearance suggests thyroid nodule, but has no thyroid continuity.    08/27/2014 Imaging   CT chest: 9.6 x 3.8 x 6.1 cm lobulated, enhancing anterior mediastinal mass.    10/01/2014 Surgery   Resection of mediastinal mass/sternotomy Serv[facility] Snare): No malignancy; benign thyroid tissue.   11/16/2014 Initial Diagnosis   Malignant tumor of parotid gland ([facility] [place])   11/16/2014 Surgery   Right parotidectomy with facial nerve dissection & preservation ([place]): Adenocarcinoma, intermediate to focally high grade, nonspecific intercalated duct type. Tumor measures 1.8 cm. No perineural/angiolymphatic invasion. Negative margins.    11/16/2014 Pathologic Stage   pT1, pNx   12/07/2014 Imaging   PET scan: No evidence of metastatic disease or suspecious metabolic activity. Interval resection of right parotid tumor without suspicious residual activity. Interval resection of anterior mediastinal mass. Stable 78m LUL lung nodule.    12/09/2014 Miscellaneous   No recommendation for adjuvant chemotherapy ([place]).    12/30/2014 - 02/09/2015 Radiation Therapy   Completed adjuvant IMRT (Isidore [facility]. Parotid bed, 60 Gy.  No nodal echelon treatment.      CANCER STAGING:  Cancer Staging  No matching staging information was found for the patient.  INTERVAL HISTORY:  [name] a 82y.o. female, seen for follow-up of right parotid gland cancer.  Denies any new onset pains.  Reported some skin thickening on the right ear cartilage.  REVIEW OF SYSTEMS:  Review of Systems  Constitutional:  Negative for appetite change and fatigue.  All other systems reviewed and are negative.   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   HTN (hypertension)    Low TSH level 01/17/2010   Neuromuscular disorder (HCC)    Paroxysmal atrial fibrillation (HCC)    normal echiocardiogram and stress nuclear; low TSH with normal T3 and T4    PONV (postoperative nausea and vomiting)    Past Surgical History:  Procedure Laterality Date   ABDOMINAL HYSTERECTOMY     w/o oophorectomy   APPENDECTOMY     BUNIONECTOMY     bilateral   CHOLECYSTECTOMY     colonoscopy  03/09/15   COLONOSCOPY N/A 03/09/2015   Procedure: COLONOSCOPY;  Surgeon: NRogene Houston MD;  [facility];  Service: Endoscopy;  Laterality: N/A;  830   ESOPHAGOGASTRODUODENOSCOPY  12/27/2011   Procedure: ESOPHAGOGASTRODUODENOSCOPY (EGD);  Surgeon: NRogene Houston MD;  [facility]: [facility];  Service: Endoscopy;  Laterality: N/A;  250   HEMORRHOID SURGERY N/A 06/20/2015   Procedure: HEMORRHOIDECTOMY;  Surgeon: MAviva Signs MD;  Location: [facility];  Service: General;  Laterality: N/A;   PAROTIDECTOMY Right 11/16/2014   Procedure: RIGHT PAROTIDECTOMY;  Surgeon: SLeta Baptist MD;  [facility]  Service: ENT;  Laterality: Right;  RESECTION OF MEDIASTINAL MASS N/A 10/01/2014   Procedure: RESECTION OF MEDIASTINAL MASS;  Surgeon: Grace Isaac, MD;  Location: [facility];  Service: Thoracic;  Laterality: N/A;   STERNOTOMY N/A 10/01/2014   Procedure: STERNOTOMY;  Surgeon: Grace Isaac, MD;  Location: [facility];  Service: Thoracic;  Laterality: N/A;    TONSILLECTOMY     TONSILLECTOMY     VEIN LIGATION AND STRIPPING      SOCIAL HISTORY:  Social History   Socioeconomic History   Marital status: Widowed    Spouse name: Not on file   Number of children: Not on file   Years of education: Not on file   Highest education level: Not on file  Occupational History   Not on file  Tobacco Use   Smoking status: Never   Smokeless tobacco: Never   Tobacco comments:    does not smoke  Vaping Use   Vaping Use: Never used  Substance and Sexual Activity   Alcohol use: Yes    Alcohol/week: 0.0 standard drinks of alcohol    Comment: 1 glass of wine at a time just socially   Drug use: No   Sexual activity: Not on file  Other Topics Concern   Not on file  Social History Narrative   Married, employment - Network engineer. No caffeine. 86 yr education    Social Determinants of Radio broadcast assistant Strain: Not on file  Food Insecurity: Not on file  Transportation Needs: Not on file  Physical Activity: Not on file  Stress: Not on file  Social Connections: Not on file  Intimate Partner Violence: Not on file    FAMILY HISTORY:  Family History  Problem Relation Age of Onset   Heart disease Father    Hypertension Father    Diabetes Mother    Hypertension Mother     CURRENT MEDICATIONS:  Current Outpatient Medications  Medication Sig Dispense Refill   amLODipine (NORVASC) 5 MG tablet Take 5 mg by mouth daily.     aspirin EC 81 MG tablet Take 81 mg by mouth daily.     famotidine (PEPCID) 20 MG tablet Take 20 mg by mouth at bedtime.     ipratropium (ATROVENT) 0.06 % nasal spray Place 2 sprays into both nostrils 2 (two) times daily.     levothyroxine (SYNTHROID) 75 MCG tablet Take 75 mcg by mouth daily before breakfast.     lisinopril (ZESTRIL) 40 MG tablet Take 40 mg by mouth daily.      metoprolol tartrate (LOPRESSOR) 25 MG tablet Take 1 tablet (25 mg total) by mouth 2 (two) times daily. 180 tablet 1   montelukast (SINGULAIR) 10 MG  tablet Take 1 tablet by mouth at bedtime.     Multiple Vitamins-Minerals (HAIR/SKIN/NAILS PO) Take 1 tablet by mouth every morning.      Omega-3 Fatty Acids (OMEGA-3 2100 PO) Take 1 capsule by mouth every morning.      omeprazole (PRILOSEC) 20 MG capsule Take 20 mg by mouth 2 (two) times daily before a meal.   3   Polyethyl Glycol-Propyl Glycol (SYSTANE) 0.4-0.3 % SOLN Apply 1 drop to eye as needed.     Probiotic Product (PROBIOTIC-10 PO) Take 1 capsule by mouth daily.     zinc gluconate 50 MG tablet Take 50 mg by mouth daily.     oxybutynin (DITROPAN) 5 MG tablet Take 5 mg by mouth daily as needed for bladder spasms. (Patient not taking: Reported on 04/10/2022)  No current facility-administered medications for this visit.    ALLERGIES:  Allergies  Allergen Reactions   Penicillins Other (See Comments)    Has patient had a PCN reaction causing immediate rash, facial/tongue/throat swelling, SOB or lightheadedness with hypotension: No Has patient had a PCN reaction causing severe rash involving mucus membranes or skin necrosis: No Has patient had a PCN reaction that required hospitalization: No Has patient had a PCN reaction occurring within the last 10 years: No If all of the above answers are "NO", then may proceed with Cephalosporin use.    Unknown "weird tingling feeling in legs" Has patient had a PCN reaction causing immediate rash, facial/tongue/throat swelling, SOB or lightheadedness with hypotension: No Has patient had a PCN reaction causing severe rash involving mucus membranes or skin necrosis: No Has patient had a PCN reaction that required hospitalization: No Has patient had a PCN reaction occurring within the last 10 years: No If all of the above answers are "NO", then may proceed with Cephalosporin use.    Unknown "weird tingling feeling in legs" Has patient had a PCN reaction causing immediate rash, facial/tongue/throat swelling, SOB or lightheadedness with  hypotension: No Has patient had a PCN reaction causing severe rash involving mucus membranes or skin necrosis: No Has patient had a PCN reaction that required hospitalization: No Has patient had a PCN reaction occurring within the last 10 years: No If all of the above answers are "NO", then may proceed with Cephalosporin use.    Unknown "weird tingling feeling in legs"   Sulfur Other (See Comments)   Sulfonamide Derivatives Other (See Comments)    Leg Pain    PHYSICAL EXAM:  Performance status (ECOG): 1 - Symptomatic but completely ambulatory  Vitals:   04/10/22 1132  BP: (!) 148/81  Pulse: 68  Resp: 18  Temp: 98 F (36.7 C)  SpO2: 100%   Wt Readings from Last 3 Encounters:  04/10/22 149 lb 14.6 oz (68 kg)  01/23/22 146 lb 6.4 oz (66.4 kg)  07/13/21 149 lb (67.6 kg)   Physical Exam Vitals reviewed.  Constitutional:      Appearance: Normal appearance.  Neck:     Comments: R parotidectomy site within normal limits Cardiovascular:     Rate and Rhythm: Normal rate and regular rhythm.     Pulses: Normal pulses.     Heart sounds: Normal heart sounds.  Pulmonary:     Effort: Pulmonary effort is normal.     Breath sounds: Normal breath sounds.  Musculoskeletal:     Right lower leg: Edema present.     Left lower leg: No edema.  Neurological:     General: No focal deficit present.     Mental Status: She is alert and oriented to person, place, and time.  Psychiatric:        Mood and Affect: Mood normal.        Behavior: Behavior normal.     LABORATORY DATA:  I have reviewed the labs as listed.     Latest Ref Rng & Units 04/03/2022   10:39 AM 04/28/2021    1:08 PM 04/16/2021    4:19 PM  CBC  WBC 4.0 - 10.5 K/uL 4.9  9.3  5.2   Hemoglobin 12.0 - 15.0 g/dL 12.5  13.2  12.6   Hematocrit 36.0 - 46.0 % 37.3  40.7  37.9   Platelets 150 - 400 K/uL 180  160  157       Latest Ref Rng & Units  04/03/2022   10:39 AM 04/28/2021    1:08 PM 04/16/2021    4:19 PM  CMP  Glucose  70 - 99 mg/dL 110  126  109   BUN 8 - 23 mg/dL '16  26  20   '$ Creatinine 0.44 - 1.00 mg/dL 0.76  0.82  0.82   Sodium 135 - 145 mmol/L 137  137  134   Potassium 3.5 - 5.1 mmol/L 3.4  4.1  3.8   Chloride 98 - 111 mmol/L 104  106  104   CO2 22 - 32 mmol/L '25  23  25   '$ Calcium 8.9 - 10.3 mg/dL 8.6  9.1  8.9   Total Protein 6.5 - 8.1 g/dL 6.7  7.2  7.0   Total Bilirubin 0.3 - 1.2 mg/dL 0.7  1.2  0.6   Alkaline Phos 38 - 126 U/L 60  65  60   AST 15 - 41 U/L '18  18  22   '$ ALT 0 - 44 U/L '15  20  19     '$ DIAGNOSTIC IMAGING:  I have independently reviewed the scans and discussed with the patient. MM 3D SCREEN BREAST BILATERAL  Result Date: 04/03/2022 CLINICAL DATA:  Screening. EXAM: DIGITAL SCREENING BILATERAL MAMMOGRAM WITH TOMOSYNTHESIS AND CAD TECHNIQUE: Bilateral screening digital craniocaudal and mediolateral oblique mammograms were obtained. Bilateral screening digital breast tomosynthesis was performed. The images were evaluated with computer-aided detection. COMPARISON:  Previous exam(s). ACR Breast Density Category b: There are scattered areas of fibroglandular density. FINDINGS: There are no findings suspicious for malignancy. IMPRESSION: No mammographic evidence of malignancy. A result letter of this screening mammogram will be mailed directly to the patient. RECOMMENDATION: Screening mammogram in one year. (Code:SM-B-01Y) BI-RADS CATEGORY  1: Negative. Electronically Signed   By: Abelardo Diesel M.D.   On: 04/03/2022 10:17     ASSESSMENT:  1.  Right parotid adenocarcinoma: - Status post surgical resection in August 2016 followed by adjuvant radiation therapy 12/30/2014 through 02/09/2015. - CT of the soft tissue neck on 07/18/2018 shows postop parotidectomy on the right for tumor resection with no recurrent mass or adenopathy in the neck. - CT of the chest with contrast showed stable subcentimeter pulmonary nodules (5 mm nodule in the left upper lobe and 4 mm nodule in the central right lower  lobe) consistent with benign postinflammatory etiology. -CT of the neck on 02/26/2020 showed stable postsurgical changes of the right parotid gland without evidence of recurrence.   PLAN:  1.  Right parotid adenocarcinoma: - Physical examination did not reveal any palpable neck adenopathy.  Right parotidectomy site is within normal limits. - Right ear cartilage: There is mild erythema and scaling.  She was told to contact dermatology. - She is more than 5 years out of her cancer treatment.  I have recommended follow-up as needed.   2.  Hypothyroidism: - Continue Synthroid.  TSH is normal on the current labs.   Orders placed this encounter:  No orders of the defined types were placed in this encounter.    Derek Jack, MD [place] [phone]

## 2022-10-18 ENCOUNTER — Emergency Department (HOSPITAL_COMMUNITY): Payer: Medicare HMO

## 2022-10-18 ENCOUNTER — Other Ambulatory Visit: Payer: Self-pay

## 2022-10-18 ENCOUNTER — Encounter (HOSPITAL_COMMUNITY): Payer: Self-pay | Admitting: Emergency Medicine

## 2022-10-18 ENCOUNTER — Emergency Department (HOSPITAL_COMMUNITY)
Admission: EM | Admit: 2022-10-18 | Discharge: 2022-10-19 | Disposition: A | Discharge status: Home / Self Care | Payer: Medicare HMO | Attending: Emergency Medicine | Admitting: Emergency Medicine

## 2022-10-18 DIAGNOSIS — Z79899 Other long term (current) drug therapy: Secondary | ICD-10-CM | POA: Insufficient documentation

## 2022-10-18 DIAGNOSIS — Z7982 Long term (current) use of aspirin: Secondary | ICD-10-CM | POA: Diagnosis not present

## 2022-10-18 DIAGNOSIS — I10 Essential (primary) hypertension: Secondary | ICD-10-CM | POA: Diagnosis not present

## 2022-10-18 DIAGNOSIS — R079 Chest pain, unspecified: Secondary | ICD-10-CM

## 2022-10-18 DIAGNOSIS — R0789 Other chest pain: Secondary | ICD-10-CM | POA: Diagnosis present

## 2022-10-18 LAB — BASIC METABOLIC PANEL
Anion gap: 9 (ref 5–15)
BUN: 18 mg/dL (ref 8–23)
CO2: 23 mmol/L (ref 22–32)
Calcium: 8.9 mg/dL (ref 8.9–10.3)
Chloride: 104 mmol/L (ref 98–111)
Creatinine, Ser: 0.69 mg/dL (ref 0.44–1.00)
GFR, Estimated: >60 mL/min (ref >60)
Glucose, Bld: 111 mg/dL — ABNORMAL HIGH (ref 70–99)
Potassium: 3.3 mmol/L — ABNORMAL LOW (ref 3.5–5.1)
Sodium: 136 mmol/L (ref 135–145)

## 2022-10-18 LAB — CBC
HCT: 41.5 % (ref 36.0–46.0)
Hemoglobin: 13.9 g/dL (ref 12.0–15.0)
MCH: 31.2 pg (ref 26.0–34.0)
MCHC: 33.5 g/dL (ref 30.0–36.0)
MCV: 93.3 fL (ref 80.0–100.0)
Platelets: 170 10*3/uL (ref 150–400)
RBC: 4.45 MIL/uL (ref 3.87–5.11)
RDW: 12.9 % (ref 11.5–15.5)
WBC: 5.5 10*3/uL (ref 4.0–10.5)
nRBC: 0 % (ref 0.0–0.2)

## 2022-10-18 LAB — TROPONIN I (HIGH SENSITIVITY)
Troponin I (High Sensitivity): 4 ng/L (ref <18)
Troponin I (High Sensitivity): 4 ng/L (ref <18)

## 2022-10-18 MED ORDER — LABETALOL HCL 5 MG/ML IV SOLN
10.0000 mg | Freq: Once | INTRAVENOUS | Status: AC
  Administered 2022-10-19: 10 mg via INTRAVENOUS
  Filled 2022-10-18: qty 4

## 2022-10-18 NOTE — ED Provider Notes (Signed)
[facility] EMERGENCY DEPARTMENT AT [facility] Provider Note   CSN: [id number] Arrival date & time: 10/18/22  1941     History  Chief Complaint  Patient presents with   Chest Pain    [name] is a 82 y.o. female.  Patient presents to the emergency department for evaluation of chest pain.  Patient reports that the pain came on this afternoon while she was at home watching the Olympics.  She had a sharp pain that quickly became just an comfortable feeling in the left chest.  She was not exerting herself at the time.  Symptoms lasted about 30 minutes and then got better.  She did not have any shortness of breath, nausea, diaphoresis.  No recurrence of the pain.  She did, however, check her blood pressure around that time and it was elevated and has been persistently elevated since.  She took her normal medications this morning, she missed her evening medications because she was here.       Home Medications Prior to Admission medications   Medication Sig Start Date End Date Taking? Authorizing Provider  amLODipine (NORVASC) 5 MG tablet Take 5 mg by mouth daily.    [provider]  aspirin EC 81 MG tablet Take 81 mg by mouth daily.    [provider]  famotidine (PEPCID) 20 MG tablet Take 20 mg by mouth at bedtime. 12/08/18   [provider]  ipratropium (ATROVENT) 0.06 % nasal spray Place 2 sprays into both nostrils 2 (two) times daily.    [provider]  levothyroxine (SYNTHROID) 75 MCG tablet Take 75 mcg by mouth daily before breakfast. 07/12/20   [provider]  lisinopril (ZESTRIL) 40 MG tablet Take 40 mg by mouth daily.  06/25/18   [provider]  metoprolol tartrate (LOPRESSOR) 25 MG tablet Take 1 tablet (25 mg total) by mouth 2 (two) times daily. 01/05/14   Jodelle Gross, NP  montelukast (SINGULAIR) 10 MG tablet Take 1 tablet by mouth at bedtime. 03/18/18   [provider]  Multiple  Vitamins-Minerals (HAIR/SKIN/NAILS PO) Take 1 tablet by mouth every morning.     [provider]  Omega-3 Fatty Acids (OMEGA-3 2100 PO) Take 1 capsule by mouth every morning.     [provider]  omeprazole (PRILOSEC) 20 MG capsule Take 20 mg by mouth 2 (two) times daily before a meal.  11/13/16   [provider]  oxybutynin (DITROPAN) 5 MG tablet Take 5 mg by mouth daily as needed for bladder spasms. Patient not taking: Reported on 04/10/2022    [provider]  Polyethyl Glycol-Propyl Glycol (SYSTANE) 0.4-0.3 % SOLN Apply 1 drop to eye as needed.    [provider]  Probiotic Product (PROBIOTIC-10 PO) Take 1 capsule by mouth daily.    [provider]  zinc gluconate 50 MG tablet Take 50 mg by mouth daily.    [provider]      Allergies    Penicillins, Sulfur, and Sulfonamide derivatives    Review of Systems   Review of Systems  Physical Exam Updated Vital Signs BP (!) 190/88   Pulse 68   Temp 98.6 F (37 C) (Oral)   Resp 12   Ht 5\' 2"  (1.575 m)   Wt 65.8 kg   SpO2 100%   BMI 26.52 kg/m  Physical Exam Vitals and nursing note reviewed.  Constitutional:      General: She is not in acute distress.  Appearance: She is well-developed.  HENT:     Head: Normocephalic and atraumatic.     Mouth/Throat:     Mouth: Mucous membranes are moist.  Eyes:     General: Vision grossly intact. Gaze aligned appropriately.     Extraocular Movements: Extraocular movements intact.     Conjunctiva/sclera: Conjunctivae normal.  Cardiovascular:     Rate and Rhythm: Normal rate and regular rhythm.     Pulses: Normal pulses.     Heart sounds: Normal heart sounds, S1 normal and S2 normal. No murmur heard.    No friction rub. No gallop.  Pulmonary:     Effort: Pulmonary effort is normal. No respiratory distress.     Breath sounds: Normal breath sounds.  Abdominal:     General: Bowel sounds are normal.     Palpations: Abdomen is  soft.     Tenderness: There is no abdominal tenderness. There is no guarding or rebound.     Hernia: No hernia is present.  Musculoskeletal:        General: No swelling.     Cervical back: Full passive range of motion without pain, normal range of motion and neck supple. No spinous process tenderness or muscular tenderness. Normal range of motion.     Right lower leg: No edema.     Left lower leg: No edema.  Skin:    General: Skin is warm and dry.     Capillary Refill: Capillary refill takes less than 2 seconds.     Findings: No ecchymosis, erythema, rash or wound.  Neurological:     General: No focal deficit present.     Mental Status: She is alert and oriented to person, place, and time.     GCS: GCS eye subscore is 4. GCS verbal subscore is 5. GCS motor subscore is 6.     Cranial Nerves: Cranial nerves 2-12 are intact.     Sensory: Sensation is intact.     Motor: Motor function is intact.     Coordination: Coordination is intact.  Psychiatric:        Attention and Perception: Attention normal.        Mood and Affect: Mood normal.        Speech: Speech normal.        Behavior: Behavior normal.     ED Results / Procedures / Treatments   Labs (all labs ordered are listed, but only abnormal results are displayed) Labs Reviewed  BASIC METABOLIC PANEL - Abnormal; Notable for the following components:      Result Value   Potassium 3.3 (*)    Glucose, Bld 111 (*)    All other components within normal limits  CBC  TROPONIN I (HIGH SENSITIVITY)  TROPONIN I (HIGH SENSITIVITY)    EKG None  Radiology DG Chest 2 View  Result Date: 10/18/2022 CLINICAL DATA:  Chest pain EXAM: CHEST - 2 VIEW COMPARISON:  X-ray 04/28/2021 and older FINDINGS: No consolidation, pneumothorax or effusion. Hyperinflation. Mild degenerative changes of the spine on lateral view. Normal cardiopericardial silhouette. Sternal hardware. IMPRESSION: No acute cardiopulmonary disease Electronically Signed   By:  Karen Kays M.D.   On: 10/18/2022 20:09    Procedures Procedures    Medications Ordered in ED Medications  labetalol (NORMODYNE) injection 10 mg (10 mg Intravenous Given 10/19/22 0011)    ED Course/ Medical Decision Making/ A&P  Medical Decision Making Amount and/or Complexity of Data Reviewed External Data Reviewed: labs, ECG and notes. Labs: ordered. Decision-making details documented in ED Course. Radiology: ordered and independent interpretation performed. Decision-making details documented in ED Course. ECG/medicine tests: ordered and independent interpretation performed. Decision-making details documented in ED Course.  Risk Prescription drug management.   Differential Diagnosis considered includes, but not limited to: STEMI; NSTEMI; myocarditis; pericarditis; pulmonary embolism; aortic dissection; pneumothorax; pneumonia; gastritis; musculoskeletal pain  Presents with atypical chest pain.  Patient experiencing pain on the left side of her chest earlier today that resolved and has not reoccurred.  Vital signs are unremarkable other than persistently elevated blood pressure.  She did miss her nighttime dose of metoprolol.  EKG unremarkable, troponins negative.  Patient given labetalol, has had significant improvement in her blood pressures.  She does not require hospitalization, will follow-up with primary care for repeat blood pressure check.  Continue her current medications.  Given return precautions.        Final Clinical Impression(s) / ED Diagnoses Final diagnoses:  Chest pain, unspecified type  Primary hypertension    Rx / DC Orders ED Discharge Orders     None         , Canary Brim, MD 10/19/22 0121

## 2022-10-18 NOTE — ED Triage Notes (Signed)
Patient c/o left sided chest pain that radiates to back since 2pm. Patient describes the pain as sharp. Patient states she also took her bp at home and it was 189/109. Hx of hypertension - no missed doses of medication. Denies shob.

## 2023-02-26 ENCOUNTER — Encounter (INDEPENDENT_AMBULATORY_CARE_PROVIDER_SITE_OTHER): Payer: Self-pay

## 2023-02-26 ENCOUNTER — Ambulatory Visit (INDEPENDENT_AMBULATORY_CARE_PROVIDER_SITE_OTHER): Payer: Medicare HMO

## 2023-02-26 VITALS — Ht 62.0 in | Wt 145.0 lb

## 2023-02-26 DIAGNOSIS — K219 Gastro-esophageal reflux disease without esophagitis: Secondary | ICD-10-CM | POA: Diagnosis not present

## 2023-02-26 DIAGNOSIS — R490 Dysphonia: Secondary | ICD-10-CM | POA: Diagnosis not present

## 2023-02-27 DIAGNOSIS — R490 Dysphonia: Secondary | ICD-10-CM | POA: Insufficient documentation

## 2023-02-27 NOTE — Progress Notes (Signed)
Patient ID: [name], female   DOB: 1941/01/11, 82 y.o.   MRN: [id number]  Follow-up: Chronic hoarseness, laryngopharyngeal reflux  HPI: The patient is an 82 year old female who returns today for her follow-up evaluation.  The patient was previously seen for chronic hoarseness and laryngopharyngeal reflux.  She was previously treated with omeprazole and famotidine.  At her last visit 6 months ago, she was noted to have moderate posterior laryngeal edema.  She was continue on her reflux treatment regimen.  The patient returns today reporting significant improvement in her chronic hoarseness.  Her primary care physician has stopped the omeprazole.  She is currently using famotidine twice daily.  She has a new complaint today of intermittent swallowing difficulty.  She has been symptomatic for the past 6 weeks.  She denies any odynophagia or chest pain.  Exam: General: Communicates without difficulty, well nourished, no acute distress. Head: Normocephalic, no evidence injury, no tenderness, facial buttresses intact without stepoff. Face/sinus: No tenderness to palpation and percussion. Facial movement is normal and symmetric. Eyes: PERRL, EOMI. No scleral icterus, conjunctivae clear. Neuro: CN II exam reveals vision grossly intact.  No nystagmus at any point of gaze. Ears: Auricles well formed without lesions.  Ear canals are intact without mass or lesion.  No erythema or edema is appreciated.  The TMs are intact without fluid. Nose: External evaluation reveals normal support and skin without lesions.  Dorsum is intact.  Anterior rhinoscopy reveals congested mucosa over anterior aspect of inferior turbinates and intact septum.  No purulence noted. Oral:  Oral cavity and oropharynx are intact, symmetric, without erythema or edema.  Mucosa is moist without lesions. Neck: Full range of motion without pain.  There is no significant lymphadenopathy.  No masses palpable.  Thyroid bed within normal limits to  palpation.  Parotid glands and submandibular glands equal bilaterally without mass.  Trachea is midline. Neuro:  CN 2-12 grossly intact.   Assessment: 1.  The patient's chronic hoarseness and laryngopharyngeal reflux have clinically improved with the reflux treatment regimen.  She is currently on famotidine twice daily. 2.  New complaint of intermittent dysphagia for the past 6 weeks.  Plan: 1.  Continue with her current reflux treatment regimen. 2.  Referral to Dr. Ashok Croon for evaluation and treatment of her intermittent dysphagia. 3.  The patient is encouraged to call with any questions or concerns.

## 2023-03-21 ENCOUNTER — Encounter: Payer: Self-pay | Admitting: Cardiology

## 2023-04-01 ENCOUNTER — Encounter (INDEPENDENT_AMBULATORY_CARE_PROVIDER_SITE_OTHER): Payer: Self-pay | Admitting: Otolaryngology

## 2023-04-01 ENCOUNTER — Ambulatory Visit (INDEPENDENT_AMBULATORY_CARE_PROVIDER_SITE_OTHER): Payer: Medicare HMO | Admitting: Otolaryngology

## 2023-04-01 VITALS — BP 195/90 | HR 76 | Ht 62.0 in | Wt 145.0 lb

## 2023-04-01 DIAGNOSIS — Z9889 Other specified postprocedural states: Secondary | ICD-10-CM

## 2023-04-01 DIAGNOSIS — C07 Malignant neoplasm of parotid gland: Secondary | ICD-10-CM

## 2023-04-01 DIAGNOSIS — Z923 Personal history of irradiation: Secondary | ICD-10-CM

## 2023-04-01 DIAGNOSIS — R1310 Dysphagia, unspecified: Secondary | ICD-10-CM | POA: Diagnosis not present

## 2023-04-01 NOTE — Progress Notes (Signed)
 ENT CONSULT:  Reason for Consult: dysphagia    HPI: Discussed the use of AI scribe software for clinical note transcription with the patient, who gave verbal consent to proceed.  History of Present Illness   The patient is an 29 yoF, with a history of adenocarcinoma of the right parotid gland, presents with intermittent dysphagia. The right parotid tumor surgery was done in 2016 by Dr Karis, with negative margins, followed by a course of radiation therapy. The patient reports that the dysphagia is not constant but occurs at times, primarily with solid foods. They describe a sensation of food not fully passing down the esophagus, giving a feeling of residual matter. The patient denies any modifications to their diet due to this issue and reports no recent weight loss. They also mention occasional choking on their own saliva.      Records Reviewed:  Oncology visit note 04/10/22 PRIOR THERAPY:  1. Parotid surgical resection on 11/16/2014. 2. Adjuvant radiation therapy from 12/30/2014 to 02/09/2015.   NGS Results: not done   CURRENT THERAPY: surveillance   BRIEF ONCOLOGIC HISTORY:     Oncology History  Malignant tumor of parotid gland (HCC)  08/24/2014 Imaging    CT neck: 19 mm mass in superficial right parotid consistent with neoplasm.  Partially visualized anterior mediastinal mass, at least 7 cm; appearance suggests thyroid  nodule, but has no thyroid  continuity.     08/27/2014 Imaging    CT chest: 9.6 x 3.8 x 6.1 cm lobulated, enhancing anterior mediastinal mass.     10/01/2014 Surgery    Resection of mediastinal mass/sternotomy Bobby): No malignancy; benign thyroid  tissue.    11/16/2014 Initial Diagnosis    Malignant tumor of parotid gland (HCC)    11/16/2014 Surgery    Right parotidectomy with facial nerve dissection & preservation ([facility]): Adenocarcinoma, intermediate to focally high grade, nonspecific intercalated duct type. Tumor measures 1.8 cm. No perineural/angiolymphatic  invasion. Negative margins.     11/16/2014 Pathologic Stage    pT1, pNx    12/07/2014 Imaging    PET scan: No evidence of metastatic disease or suspecious metabolic activity. Interval resection of right parotid tumor without suspicious residual activity. Interval resection of anterior mediastinal mass. Stable 5mm LUL lung nodule.        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   HTN (hypertension)    Low TSH level 01/17/2010   Neuromuscular disorder (HCC)    Paroxysmal atrial fibrillation (HCC)    normal echiocardiogram and stress nuclear; low TSH with normal T3 and T4    PONV (postoperative nausea and vomiting)     Past Surgical History:  Procedure Laterality Date   ABDOMINAL HYSTERECTOMY     w/o oophorectomy   APPENDECTOMY     BUNIONECTOMY     bilateral   CHOLECYSTECTOMY     colonoscopy  03/09/15   COLONOSCOPY N/A 03/09/2015   Procedure: COLONOSCOPY;  Surgeon: Claudis RAYMOND Rivet, MD;  Location: [facility];  Service: Endoscopy;  Laterality: N/A;  830   ESOPHAGOGASTRODUODENOSCOPY  12/27/2011   Procedure: ESOPHAGOGASTRODUODENOSCOPY (EGD);  Surgeon: Claudis RAYMOND Rivet, MD;  Location: [facility];  Service: Endoscopy;  Laterality: N/A;  250   HEMORRHOID SURGERY N/A 06/20/2015   Procedure: HEMORRHOIDECTOMY;  Surgeon: Oneil Budge, MD;  Location: [facility];  Service: General;  Laterality: N/A;   PAROTIDECTOMY Right 11/16/2014   Procedure: RIGHT PAROTIDECTOMY;  Surgeon: Daniel Moccasin, MD;  Location: [facility];  Service: ENT;  Laterality: Right;   RESECTION OF MEDIASTINAL MASS N/A 10/01/2014   Procedure: RESECTION OF MEDIASTINAL MASS;  Surgeon: Dallas KATHEE Jude, MD;  Location: [facility] OR;  Service: Thoracic;  Laterality: N/A;   STERNOTOMY N/A 10/01/2014   Procedure: STERNOTOMY;  Surgeon: Dallas KATHEE Jude, MD;  Location: [facility] OR;  Service: Thoracic;  Laterality: N/A;    TONSILLECTOMY     TONSILLECTOMY     VEIN LIGATION AND STRIPPING      Family History  Problem Relation Age of Onset   Heart disease Father    Hypertension Father    Diabetes Mother    Hypertension Mother     Social History:  reports that she has never smoked. She has never used smokeless tobacco. She reports current alcohol use. She reports that she does not use drugs.  Allergies:  Allergies  Allergen Reactions   Penicillins Other (See Comments)    Has patient had a PCN reaction causing immediate rash, facial/tongue/throat swelling, SOB or lightheadedness with hypotension: No Has patient had a PCN reaction causing severe rash involving mucus membranes or skin necrosis: No Has patient had a PCN reaction that required hospitalization: No Has patient had a PCN reaction occurring within the last 10 years: No If all of the above answers are NO, then may proceed with Cephalosporin use.    Unknown weird tingling feeling in legs Has patient had a PCN reaction causing immediate rash, facial/tongue/throat swelling, SOB or lightheadedness with hypotension: No Has patient had a PCN reaction causing severe rash involving mucus membranes or skin necrosis: No Has patient had a PCN reaction that required hospitalization: No Has patient had a PCN reaction occurring within the last 10 years: No If all of the above answers are NO, then may proceed with Cephalosporin use.    Unknown weird tingling feeling in legs Has patient had a PCN reaction causing immediate rash, facial/tongue/throat swelling, SOB or lightheadedness with hypotension: No Has patient had a PCN reaction causing severe rash involving mucus membranes or skin necrosis: No Has patient had a PCN reaction that required hospitalization: No Has patient had a PCN reaction occurring within the last 10 years: No If all of the above answers are NO, then may proceed with Cephalosporin use.    Unknown weird tingling feeling in  legs   Sulfa Antibiotics Hives   Sulfur Other (See Comments)   Sulfonamide Derivatives Other (See Comments)    Leg Pain    Medications: I have reviewed the patient's current medications.  The PMH, PSH, Medications, Allergies, and SH were reviewed and updated.  ROS: Constitutional: Negative for fever, weight loss and weight gain. Cardiovascular: Negative for chest pain and dyspnea on exertion. Respiratory: Is not experiencing shortness of breath at rest. Gastrointestinal: Negative for nausea and vomiting. Neurological: Negative for headaches. Psychiatric: The patient is not nervous/anxious  Blood pressure (!) 195/90, pulse 76, height 5' 2 (1.575 m), weight 145 lb (65.8 kg), SpO2 97%.  PHYSICAL EXAM:  Exam: General: Well-developed, well-nourished Respiratory Respiratory effort: Equal inspiration and expiration without stridor Cardiovascular Peripheral Vascular: Warm extremities with equal color/perfusion Eyes: No nystagmus with equal extraocular motion bilaterally Neuro/Psych/Balance: Patient oriented to person, place, and time; Appropriate mood and affect; Gait is intact with no imbalance; Cranial nerves I-XII are intact Head and Face Inspection: Normocephalic and atraumatic without mass or lesion Palpation: Facial skeleton intact without bony stepoffs Salivary Glands: No mass or tenderness Facial Strength:  Facial motility symmetric and full bilaterally ENT Pinna: External ear intact and fully developed External canal: Canal is patent with intact skin Tympanic Membrane: Clear and mobile External Nose: No scar or anatomic deformity Internal Nose: Septum is deviated to the left. No polyp, or purulence. Mucosal edema and erythema present.  Bilateral inferior turbinate hypertrophy.  Lips, Teeth, and gums: Mucosa and teeth intact and viable TMJ: No pain to palpation with full mobility Oral cavity/oropharynx: No erythema or exudate, no lesions present Nasopharynx: No mass or  lesion with intact mucosa Hypopharynx: Intact mucosa without pooling of secretions Larynx Glottic: Full true vocal cord mobility without lesion or mass Supraglottic: Normal appearing epiglottis and AE folds Interarytenoid Space: Moderate pachydermia&edema Subglottic Space: Patent without lesion or edema Neck Neck and Trachea: Midline trachea without mass or lesion Thyroid : No mass or nodularity Lymphatics: No lymphadenopathy  Procedure: Preoperative diagnosis: dysphagia   Postoperative diagnosis:   Same  Procedure: Flexible fiberoptic laryngoscopy  Surgeon: Elena Larry, MD  Anesthesia: Topical lidocaine  and Afrin Complications: None Condition is stable throughout exam  Indications and consent:  The patient presents to the clinic with Indirect laryngoscopy view was incomplete. Thus it was recommended that they undergo a flexible fiberoptic laryngoscopy. All of the risks, benefits, and potential complications were reviewed with the patient preoperatively and verbal informed consent was obtained.  Procedure: The patient was seated upright in the clinic. Topical lidocaine  and Afrin were applied to the nasal cavity. After adequate anesthesia had occurred, I then proceeded to pass the flexible telescope into the nasal cavity. The nasal cavity was patent without rhinorrhea or polyp. The nasopharynx was also patent without mass or lesion. The base of tongue was visualized and was normal. There were no signs of pooling of secretions in the piriform sinuses. The true vocal folds were mobile bilaterally. There were no signs of glottic or supraglottic mucosal lesion or mass. There was moderate interarytenoid pachydermia and post cricoid edema. The telescope was then slowly withdrawn and the patient tolerated the procedure throughout.  Studies Reviewed: CT Neck 02/26/2020 FINDINGS: Pharynx and larynx: Normal. No mass or swelling.   Salivary glands: Stable postsurgical changes of the right  parotid gland with small residual parenchyma and no evidence of masslike enhancement to suggest recurrent disease. The stylomastoid foramen remains with normal size.   The left parotid gland, bilateral submandibular glands and sublingual regions appear normal.   Thyroid : Normal.   Lymph nodes: None enlarged or abnormal density.   Vascular: Negative.   Limited intracranial: Negative.   Visualized orbits: Negative.   Mastoids and visualized paranasal sinuses: Clear.   Skeleton: Postsurgical changes from sternotomy. Degenerative changes of the cervical spine with C3-4 anterolisthesis. Bilateral temporomandibular joint degenerative changes, more pronounced on the right.   Upper chest: Negative.   Other: None.   IMPRESSION: Stable postsurgical changes of the right parotid gland without evidence of recurrent disease.  CT chest 07/18/2018 IMPRESSION: Stable sub-cm pulmonary nodules, consistent with benign postinflammatory etiology. No active disease within the thorax.    Surgical pathology 11/16/2014   Assessment/Plan: Encounter Diagnoses  Name Primary?   Dysphagia, unspecified type Yes   Malignant tumor of parotid gland (HCC)    H/O superficial parotidectomy    History of radiation to head and neck region     Assessment and Plan    Dysphagia Intermittent difficulty swallowing, primarily with solid foods. No weight loss reported. No concerning findings on scope exam today. Likely age-related decline in swallowing function, but further evaluation needed  to rule out other causes and determine if sx are due to oropharyngeal vs esophageal dysphagia. Explained that a swallow study will help identify if there are any obstructions or strictures in the esophagus.  - Order swallow study MBS esophagram - Maintain current diet until swallow study results are available  Adenocarcinoma of the right Parotid Gland Diagnosed and treated in 2016 with surgical resection and radiation  therapy. No recurrence noted on recent examination. - Continue routine follow-up with oncology as scheduled   Follow-up - Schedule follow-up appointment after swallow study results are available  General Health Maintenance None - No additional health maintenance recommendations at this time.     Thank you for allowing me to participate in the care of this patient. Please do not hesitate to contact me with any questions or concerns.   Elena Larry, MD Otolaryngology [facility] Phone: [phone] Fax: [phone]    04/01/2023, 7:25 PM

## 2023-04-02 ENCOUNTER — Other Ambulatory Visit (HOSPITAL_COMMUNITY): Payer: Self-pay | Admitting: *Deleted

## 2023-04-02 DIAGNOSIS — R1310 Dysphagia, unspecified: Secondary | ICD-10-CM

## 2023-04-08 ENCOUNTER — Other Ambulatory Visit: Payer: Self-pay

## 2023-04-08 ENCOUNTER — Emergency Department (HOSPITAL_COMMUNITY)
Admission: EM | Admit: 2023-04-08 | Discharge: 2023-04-08 | Disposition: A | Discharge status: Home / Self Care | Payer: Medicare HMO | Attending: Emergency Medicine | Admitting: Emergency Medicine

## 2023-04-08 ENCOUNTER — Encounter (HOSPITAL_COMMUNITY): Payer: Self-pay

## 2023-04-08 ENCOUNTER — Emergency Department (HOSPITAL_COMMUNITY): Payer: Medicare HMO

## 2023-04-08 DIAGNOSIS — Z7982 Long term (current) use of aspirin: Secondary | ICD-10-CM | POA: Diagnosis not present

## 2023-04-08 DIAGNOSIS — R079 Chest pain, unspecified: Secondary | ICD-10-CM | POA: Diagnosis present

## 2023-04-08 DIAGNOSIS — I10 Essential (primary) hypertension: Secondary | ICD-10-CM | POA: Diagnosis not present

## 2023-04-08 DIAGNOSIS — R030 Elevated blood-pressure reading, without diagnosis of hypertension: Secondary | ICD-10-CM

## 2023-04-08 DIAGNOSIS — Z79899 Other long term (current) drug therapy: Secondary | ICD-10-CM | POA: Diagnosis not present

## 2023-04-08 LAB — BASIC METABOLIC PANEL
Anion gap: 9 (ref 5–15)
BUN: 16 mg/dL (ref 8–23)
CO2: 22 mmol/L (ref 22–32)
Calcium: 8.9 mg/dL (ref 8.9–10.3)
Chloride: 104 mmol/L (ref 98–111)
Creatinine, Ser: 0.68 mg/dL (ref 0.44–1.00)
GFR, Estimated: >60 mL/min (ref >60)
Glucose, Bld: 106 mg/dL — ABNORMAL HIGH (ref 70–99)
Potassium: 3.5 mmol/L (ref 3.5–5.1)
Sodium: 135 mmol/L (ref 135–145)

## 2023-04-08 LAB — TROPONIN I (HIGH SENSITIVITY)
Troponin I (High Sensitivity): 4 ng/L (ref <18)
Troponin I (High Sensitivity): 4 ng/L (ref <18)

## 2023-04-08 LAB — CBC WITH DIFFERENTIAL/PLATELET
Abs Immature Granulocytes: 0.01 10*3/uL (ref 0.00–0.07)
Basophils Absolute: 0.1 10*3/uL (ref 0.0–0.1)
Basophils Relative: 1 %
Eosinophils Absolute: 0.1 10*3/uL (ref 0.0–0.5)
Eosinophils Relative: 1 %
HCT: 40.5 % (ref 36.0–46.0)
Hemoglobin: 14 g/dL (ref 12.0–15.0)
Immature Granulocytes: 0 %
Lymphocytes Relative: 31 %
Lymphs Abs: 1.5 10*3/uL (ref 0.7–4.0)
MCH: 31.7 pg (ref 26.0–34.0)
MCHC: 34.6 g/dL (ref 30.0–36.0)
MCV: 91.8 fL (ref 80.0–100.0)
Monocytes Absolute: 0.5 10*3/uL (ref 0.1–1.0)
Monocytes Relative: 9 %
Neutro Abs: 2.9 10*3/uL (ref 1.7–7.7)
Neutrophils Relative %: 58 %
Platelets: 166 10*3/uL (ref 150–400)
RBC: 4.41 MIL/uL (ref 3.87–5.11)
RDW: 12.8 % (ref 11.5–15.5)
WBC: 5 10*3/uL (ref 4.0–10.5)
nRBC: 0 % (ref 0.0–0.2)

## 2023-04-08 MED ORDER — METOPROLOL TARTRATE 25 MG PO TABS
25.0000 mg | ORAL_TABLET | Freq: Once | ORAL | Status: AC
  Administered 2023-04-08: 25 mg via ORAL
  Filled 2023-04-08: qty 1

## 2023-04-08 NOTE — ED Notes (Signed)
Pt given cracker and peanut butter at this time.

## 2023-04-08 NOTE — ED Triage Notes (Signed)
Pt arrived via pOV from home c/o chest pain that began last night. Pt endorses numb sensation that radiates to her back. Pt reports elevated BP readings last night and this morning, and reports she did take her BP meds as prescribed.

## 2023-04-08 NOTE — Discharge Instructions (Signed)
Contact a health care provider if you: Think you are having a reaction to a medicine you are taking. Have headaches that keep coming back (recurring). Feel dizzy. Have swelling in your ankles. Have trouble with your vision. Get help right away if you: Develop a severe headache or confusion. Have unusual weakness or numbness. Feel faint. Have severe pain in your chest or abdomen. Vomit repeatedly. Have trouble breathing. These symptoms may be an emergency. Get help right away. Call 911. Do not wait to see if the symptoms will go away. Do not drive yourself to the hospital. 

## 2023-04-08 NOTE — ED Notes (Signed)
Pt ambulated to the restroom.

## 2023-04-08 NOTE — ED Provider Notes (Signed)
[place] EMERGENCY DEPARTMENT AT [facility] Provider Note   CSN: [id number] Arrival date & time: 04/08/23  0848     History  Chief Complaint  Patient presents with   Chest Pain    [name] is a 83 y.o. female.  Presents emergency department with a chief complaint of hypertension.  Patient has a past medical history of hypertension, questionable atrial fibrillation 10 years ago but she is not on any anticoagulation.  Patient reports her blood pressure at home was 180 systolic last night.  She takes 3 blood pressure medications including amlodipine, lisinopril, and metoprolol.  She took them last night before going to bed and when she woke up this morning her systolic pressure was 160.  Patient also began feeling pain described as "numbness" in her back and left arm.  She denies chest pain, nausea, vomiting, diaphoresis or shortness of breath and has no known history of coronary artery disease.   Chest Pain      Home Medications Prior to Admission medications   Medication Sig Start Date End Date Taking? Authorizing Provider  amLODipine (NORVASC) 5 MG tablet Take 5 mg by mouth daily.   Yes [provider]  aspirin EC 81 MG tablet Take 81 mg by mouth daily.   Yes [provider]  famotidine (PEPCID) 20 MG tablet Take 20 mg by mouth 2 (two) times daily. 12/08/18  Yes [provider]  ipratropium (ATROVENT) 0.06 % nasal spray Place 2 sprays into both nostrils 2 (two) times daily.   Yes [provider]  levothyroxine (SYNTHROID) 75 MCG tablet Take 75 mcg by mouth daily before breakfast. 07/12/20  Yes [provider]  lisinopril (ZESTRIL) 40 MG tablet Take 40 mg by mouth daily.  06/25/18  Yes [provider]  metoprolol tartrate (LOPRESSOR) 25 MG tablet Take 1 tablet by mouth 2 (two) times daily. 03/29/23  Yes [provider]  montelukast (SINGULAIR) 10 MG tablet Take 1 tablet by mouth at bedtime. 03/18/18  Yes  [provider]  Multiple Vitamins-Minerals (HAIR/SKIN/NAILS PO) Take 1 tablet by mouth every morning.    Yes [provider]  Omega-3 Fatty Acids (OMEGA-3 2100 PO) Take 1 capsule by mouth every morning.    Yes [provider]  Polyethyl Glycol-Propyl Glycol (SYSTANE) 0.4-0.3 % SOLN Apply 1 drop to eye as needed.   Yes [provider]  Probiotic Product (PROBIOTIC-10 PO) Take 1 capsule by mouth daily.   Yes [provider]  senna-docusate (SENOKOT-S) 8.6-50 MG tablet Take 1 tablet by mouth daily. 09/26/22 09/26/23 Yes [provider]  zinc gluconate 50 MG tablet Take 50 mg by mouth daily.   Yes [provider]      Allergies    Penicillins, Sulfa antibiotics, Sulfur, and Sulfonamide derivatives    Review of Systems   Review of Systems  Cardiovascular:  Positive for chest pain.    Physical Exam Updated Vital Signs BP (!) 150/67   Pulse 63   Temp 97.8 F (36.6 C) (Oral)   Resp 13   Ht 5\' 2"  (1.575 m)   Wt 65.7 kg   SpO2 96%   BMI 26.49 kg/m  Physical Exam Physical Exam  Nursing note and vitals reviewed. Constitutional: She is oriented to person, place, and time. She appears well-developed and well-nourished. No distress.  HENT:  Head: Normocephalic and atraumatic.  Eyes: Conjunctivae normal and EOM are normal. Pupils are equal, round, and reactive to light. No scleral icterus.  Neck: Normal range of motion.  Cardiovascular: Normal rate, regular rhythm and normal heart sounds.  Exam reveals no gallop and no friction rub.   No murmur heard. Pulmonary/Chest: Effort normal and breath sounds normal. No respiratory distress.  Abdominal: Soft. Bowel sounds are normal. She exhibits no distension and no mass. There is no tenderness. There is no guarding.  Neurological: She is alert and oriented to person, place, and time.  Skin: Skin is warm and dry. She is not diaphoretic.   ED Results / Procedures / Treatments    Labs (all labs ordered are listed, but only abnormal results are displayed) Labs Reviewed  BASIC METABOLIC PANEL - Abnormal; Notable for the following components:      Result Value   Glucose, Bld 106 (*)    All other components within normal limits  CBC WITH DIFFERENTIAL/PLATELET  TROPONIN I (HIGH SENSITIVITY)  TROPONIN I (HIGH SENSITIVITY)    EKG EKG Interpretation Date/Time:  Monday April 08 2023 09:11:01 EST Ventricular Rate:  69 PR Interval:  197 QRS Duration:  132 QT Interval:  443 QTC Calculation: 475 R Axis:   -66  Text Interpretation: Sinus rhythm Left bundle branch block no significant change since Aug 2024 Confirmed by Pricilla Loveless [phone]) on 04/08/2023 9:14:18 AM  Radiology DG Chest 2 View Result Date: 04/08/2023 CLINICAL DATA:  Chest pain since last night. Numb sensation radiating into the back. EXAM: CHEST - 2 VIEW COMPARISON:  Radiographs 10/18/2022 and 04/28/2021. FINDINGS: The heart size and mediastinal contours are stable status post median sternotomy. There is mild central airway thickening with mildly increased left basilar atelectasis. No edema, confluent airspace disease, pleural effusion or pneumothorax. The bones appear unchanged. Multiple telemetry leads overlie the chest. IMPRESSION: Mild central airway thickening with mildly increased left basilar atelectasis. No evidence of pneumonia or edema. Electronically Signed   By: Carey Bullocks M.D.   On: 04/08/2023 10:05    Procedures Procedures    Medications Ordered in ED Medications  metoprolol tartrate (LOPRESSOR) tablet 25 mg (25 mg Oral Given 04/08/23 1208)    ED Course/ Medical Decision Making/ A&P Clinical Course as of 04/08/23 1535  Mon Apr 08, 2023  1028 Troponin I (High Sensitivity) [AH]  1028 Basic metabolic panel(!) [AH]  1028 CBC with Differential [AH]  1028 DG Chest 2 View [AH]  1028 I personally visualized and interpreted the images using our PACS system. Acute findings include:   No acute findings on CXR   [AH]  1029 ED ECG REPORT  Rate: 67  Rhythm: sinus rhythm  QRS Axis: normal  Intervals: normal  ST/T Wave abnormalities: normal  Conduction Disutrbances:left bundle branch block [AH]  1534 Troponin I (High Sensitivity) [AH]  1534 Basic metabolic panel(!) [AH]  1534 CBC with Differential [AH]    Clinical Course User Index [AH] Arthor Captain, PA-C                                 Medical Decision Making Amount and/or Complexity of Data Reviewed Labs: ordered. Decision-making details documented in ED Course. Radiology: ordered. Decision-making details documented in ED Course.  Risk Prescription drug management.   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 The complaint involves an extensive differential diagnosis and treatment  options and also carries with it a high risk of complications and morbidity.   The differential diagnosis includes The emergent differential diagnosis of chest pain includes: Acute coronary syndrome, pericarditis, aortic dissection, pulmonary embolism, tension pneumothorax, pneumonia, retrosternal mass, thoracic radiculopathy, and esophageal rupture.     Additional Tests and treatment considered: I considered PE and dissection study however patient is very low risk for either of these. Additional history obtained: Additional history obtained from family Records reviewed Care Everywhere/External Records  Reassessment and review (also see workup area): Lab Tests: I Ordered, and personally interpreted labs.  The pertinent results include: Labs within normal limits, troponin negative x 2 with  Imaging Studies: I ordered and independently visualized and interpreted the following  imaging X-ray of chest   which showed no findings The interpretation of the imaging was limited to assessing for emergent pathology, for which purpose it was ordered.    Medicines ordered and prescription drug management: I ordered the following medications 25  mg Metroprolol  for HTN ( patient did not take this normal part of her medications today  I considered this additional medications: Increasing dose of her medicine or adding additional medications however given the fact that she did not actually take her entire dose of medications I think that this would potentially cause detrimental hypotension in this patient.   Reevaluation of the patient after these medicines showed that the patient    improved  Social Determinants of Health: SDOH Screenings   Food Insecurity: No Food Insecurity (09/26/2022)   Received from [facility]  Transportation Needs: No Transportation Needs (09/26/2022)   Received from [facility]  Utilities: Not At Risk (05/22/2022)   Received from [facility]  Financial Resource Strain: Low Risk  (09/26/2022)   Received from [facility]  Physical Activity: Unknown (05/22/2022)   Received from [facility]  Social Connections: Socially Integrated (05/22/2022)   Received from [facility]  Stress: No Stress Concern Present (09/26/2022)   Received from [facility]  Tobacco Use: Low Risk  (04/08/2023)  Health Literacy: Low Risk  (03/28/2022)   Received from [facility], [facility]     Cardiac Monitoring: The patient was maintained on a cardiac monitor.  I personally viewed and interpreted the cardiac monitor which showed an underlying rhythm of:  sinus rhythm  Complexity of problems addressed: Patient's presentation is most consistent with  exacerbation of chronic illness During patient's assessment  Disposition: After consideration of the diagnostic results and the patient's response to treatment,  I feel that the patent would benefit from  discharge with outpatient follow-up .     MDM: Patient here with concern for hypertension.  Patient did go to a church buffet yesterday which may have elevated her blood pressures given the unknown amount of sodium in the food.  Patient also did not take her full dose of medications today.  She does not appear to have ACS and appears appropriate for discharge at this time with a moderate risk heart score.       Final Clinical Impression(s) / ED Diagnoses Final diagnoses:  Elevated blood pressure reading    Rx / DC Orders ED Discharge Orders     None         Arthor Captain, PA-C 04/08/23 1539    Pricilla Loveless, MD 04/11/23 [phone]

## 2023-04-26 ENCOUNTER — Other Ambulatory Visit (HOSPITAL_COMMUNITY): Payer: Self-pay | Admitting: Family Medicine

## 2023-04-26 DIAGNOSIS — Z1231 Encounter for screening mammogram for malignant neoplasm of breast: Secondary | ICD-10-CM

## 2023-05-01 ENCOUNTER — Encounter (HOSPITAL_COMMUNITY): Payer: Medicare HMO

## 2023-05-01 ENCOUNTER — Ambulatory Visit (HOSPITAL_COMMUNITY): Payer: Medicare HMO

## 2023-05-08 ENCOUNTER — Ambulatory Visit (HOSPITAL_COMMUNITY): Payer: Medicare HMO

## 2023-05-16 ENCOUNTER — Ambulatory Visit (HOSPITAL_COMMUNITY)
Admission: RE | Admit: 2023-05-16 | Discharge: 2023-05-16 | Disposition: A | Discharge status: Home / Self Care | Payer: Medicare HMO | Source: Ambulatory Visit | Attending: Family Medicine | Admitting: Family Medicine

## 2023-05-16 ENCOUNTER — Encounter (HOSPITAL_COMMUNITY): Payer: Self-pay

## 2023-05-16 DIAGNOSIS — Z1231 Encounter for screening mammogram for malignant neoplasm of breast: Secondary | ICD-10-CM | POA: Diagnosis present

## 2023-05-21 ENCOUNTER — Ambulatory Visit (HOSPITAL_COMMUNITY)
Admission: RE | Admit: 2023-05-21 | Discharge: 2023-05-21 | Disposition: A | Discharge status: Home / Self Care | Payer: Medicare HMO | Source: Ambulatory Visit | Attending: Otolaryngology

## 2023-05-21 ENCOUNTER — Ambulatory Visit (HOSPITAL_COMMUNITY)
Admission: RE | Admit: 2023-05-21 | Discharge: 2023-05-21 | Disposition: A | Discharge status: Home / Self Care | Payer: Medicare HMO | Source: Ambulatory Visit | Attending: Otolaryngology | Admitting: Otolaryngology

## 2023-05-21 DIAGNOSIS — R1310 Dysphagia, unspecified: Secondary | ICD-10-CM | POA: Insufficient documentation

## 2023-05-21 DIAGNOSIS — K224 Dyskinesia of esophagus: Secondary | ICD-10-CM | POA: Diagnosis not present

## 2023-05-27 ENCOUNTER — Encounter (INDEPENDENT_AMBULATORY_CARE_PROVIDER_SITE_OTHER): Payer: Self-pay | Admitting: Otolaryngology

## 2023-05-27 ENCOUNTER — Ambulatory Visit (INDEPENDENT_AMBULATORY_CARE_PROVIDER_SITE_OTHER): Payer: Medicare HMO | Admitting: Otolaryngology

## 2023-05-27 VITALS — BP 163/93 | HR 83

## 2023-05-27 DIAGNOSIS — Z923 Personal history of irradiation: Secondary | ICD-10-CM

## 2023-05-27 DIAGNOSIS — R1310 Dysphagia, unspecified: Secondary | ICD-10-CM | POA: Diagnosis not present

## 2023-05-27 DIAGNOSIS — K224 Dyskinesia of esophagus: Secondary | ICD-10-CM

## 2023-05-27 DIAGNOSIS — K219 Gastro-esophageal reflux disease without esophagitis: Secondary | ICD-10-CM

## 2023-05-27 DIAGNOSIS — Z9889 Other specified postprocedural states: Secondary | ICD-10-CM

## 2023-05-27 DIAGNOSIS — C07 Malignant neoplasm of parotid gland: Secondary | ICD-10-CM

## 2023-05-27 NOTE — Progress Notes (Unsigned)
 ENT Progress Note:   Update 05/28/2023  Discussed the use of AI scribe software for clinical note transcription with the patient, who gave verbal consent to proceed.  History of Present Illness   The patient presents for f/u after swallow study. Hx of R parotidectomy 2016 by Dr Suszanne Conners f/b XRT.   She has experienced swallowing difficulties, which have recently shown improvement. She is not on any reflux medications and does not recall being prescribed any for this issue.  She recalls being advised at the hospital to drink more liquids after eating to prevent food from getting stuck.    Records Reviewed:  Initial Evaluation Reason for Consult: dysphagia    HPI: Discussed the use of AI scribe software for clinical note transcription with the patient, who gave verbal consent to proceed.  History of Present Illness   The patient is an 6 yoF, with a history of adenocarcinoma of the right parotid gland, presents with intermittent dysphagia. The right parotid tumor surgery was done in 2016 by Dr Suszanne Conners, with negative margins, followed by a course of radiation therapy. The patient reports that the dysphagia is not constant but occurs at times, primarily with solid foods. They describe a sensation of food not fully passing down the esophagus, giving a feeling of residual matter. The patient denies any modifications to their diet due to this issue and reports no recent weight loss. They also mention occasional choking on their own saliva.      Records Reviewed:  Oncology visit note 04/10/22 PRIOR THERAPY:  1. Parotid surgical resection on 11/16/2014. 2. Adjuvant radiation therapy from 12/30/2014 to 02/09/2015.   NGS Results: not done   CURRENT THERAPY: surveillance   BRIEF ONCOLOGIC HISTORY:     Oncology History  Malignant tumor of parotid gland (HCC)  08/24/2014 Imaging    CT neck: 19 mm mass in superficial right parotid consistent with neoplasm.  Partially visualized anterior mediastinal  mass, at least 7 cm; appearance suggests thyroid nodule, but has no thyroid continuity.     08/27/2014 Imaging    CT chest: 9.6 x 3.8 x 6.1 cm lobulated, enhancing anterior mediastinal mass.     10/01/2014 Surgery    Resection of mediastinal mass/sternotomy Tyrone Sage): No malignancy; benign thyroid tissue.    11/16/2014 Initial Diagnosis    Malignant tumor of parotid gland (HCC)    11/16/2014 Surgery    Right parotidectomy with facial nerve dissection & preservation ([facility]): Adenocarcinoma, intermediate to focally high grade, nonspecific intercalated duct type. Tumor measures 1.8 cm. No perineural/angiolymphatic invasion. Negative margins.     11/16/2014 Pathologic Stage    pT1, pNx    12/07/2014 Imaging    PET scan: No evidence of metastatic disease or suspecious metabolic activity. Interval resection of right parotid tumor without suspicious residual activity. Interval resection of anterior mediastinal mass. Stable 5mm LUL lung nodule.        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   HTN (hypertension)    Low TSH level 01/17/2010   Neuromuscular disorder (HCC)    Paroxysmal atrial fibrillation (HCC)    normal echiocardiogram and stress nuclear; low TSH with normal T3 and T4    PONV (postoperative nausea and vomiting)     Past Surgical History:  Procedure Laterality Date   ABDOMINAL HYSTERECTOMY     w/o oophorectomy   APPENDECTOMY     BUNIONECTOMY  bilateral   CHOLECYSTECTOMY     colonoscopy  03/09/15   COLONOSCOPY N/A 03/09/2015   Procedure: COLONOSCOPY;  Surgeon: Malissa Hippo, MD;  Location: [facility];  Service: Endoscopy;  Laterality: N/A;  830   ESOPHAGOGASTRODUODENOSCOPY  12/27/2011   Procedure: ESOPHAGOGASTRODUODENOSCOPY (EGD);  Surgeon: Malissa Hippo, MD;  Location: [facility];  Service: Endoscopy;  Laterality: N/A;  250   HEMORRHOID SURGERY N/A  06/20/2015   Procedure: HEMORRHOIDECTOMY;  Surgeon: Franky Macho, MD;  Location: [facility];  Service: General;  Laterality: N/A;   PAROTIDECTOMY Right 11/16/2014   Procedure: RIGHT PAROTIDECTOMY;  Surgeon: Newman Pies, MD;  Location: [facility];  Service: ENT;  Laterality: Right;   RESECTION OF MEDIASTINAL MASS N/A 10/01/2014   Procedure: RESECTION OF MEDIASTINAL MASS;  Surgeon: Delight Ovens, MD;  Location: [facility] OR;  Service: Thoracic;  Laterality: N/A;   STERNOTOMY N/A 10/01/2014   Procedure: STERNOTOMY;  Surgeon: Delight Ovens, MD;  Location: [facility] OR;  Service: Thoracic;  Laterality: N/A;   TONSILLECTOMY     TONSILLECTOMY     VEIN LIGATION AND STRIPPING      Family History  Problem Relation Age of Onset   Heart disease Father    Hypertension Father    Diabetes Mother    Hypertension Mother     Social History:  reports that she has never smoked. She has never used smokeless tobacco. She reports current alcohol use. She reports that she does not use drugs.  Allergies:  Allergies  Allergen Reactions   Penicillins Other (See Comments)    Has patient had a PCN reaction causing immediate rash, facial/tongue/throat swelling, SOB or lightheadedness with hypotension: No Has patient had a PCN reaction causing severe rash involving mucus membranes or skin necrosis: No Has patient had a PCN reaction that required hospitalization: No Has patient had a PCN reaction occurring within the last 10 years: No If all of the above answers are "NO", then may proceed with Cephalosporin use.    Unknown "weird tingling feeling in legs" Has patient had a PCN reaction causing immediate rash, facial/tongue/throat swelling, SOB or lightheadedness with hypotension: No Has patient had a PCN reaction causing severe rash involving mucus membranes or skin necrosis: No Has patient had a PCN reaction that required hospitalization: No Has patient had a PCN reaction occurring within the last 10 years:  No If all of the above answers are "NO", then may proceed with Cephalosporin use.    Unknown "weird tingling feeling in legs" Has patient had a PCN reaction causing immediate rash, facial/tongue/throat swelling, SOB or lightheadedness with hypotension: No Has patient had a PCN reaction causing severe rash involving mucus membranes or skin necrosis: No Has patient had a PCN reaction that required hospitalization: No Has patient had a PCN reaction occurring within the last 10 years: No If all of the above answers are "NO", then may proceed with Cephalosporin use.    Unknown "weird tingling feeling in legs"   Sulfa Antibiotics Hives   Sulfur Other (See Comments)   Sulfonamide Derivatives Other (See Comments)    Leg Pain    Medications: I have reviewed the patient's current medications.  The PMH, PSH, Medications, Allergies, and SH were reviewed and updated.  ROS: Constitutional: Negative for fever, weight loss and weight gain. Cardiovascular: Negative for chest pain and dyspnea on exertion. Respiratory: Is not experiencing shortness of breath at rest. Gastrointestinal: Negative for nausea and vomiting. Neurological: Negative for headaches. Psychiatric: The patient  is not nervous/anxious  Blood pressure (!) 163/93, pulse 83, SpO2 97%.  PHYSICAL EXAM:  Exam: General: Well-developed, well-nourished Respiratory Respiratory effort: Equal inspiration and expiration without stridor Cardiovascular Peripheral Vascular: Warm extremities with equal color/perfusion Eyes: No nystagmus with equal extraocular motion bilaterally Neuro/Psych/Balance: Patient oriented to person, place, and time; Appropriate mood and affect; Gait is intact with no imbalance; Cranial nerves I-XII are intact Head and Face Inspection: Normocephalic and atraumatic without mass or lesion Facial Strength: Facial motility symmetric and full bilaterally ENT Pinna: External ear intact and fully developed External  canal: Canal is patent with intact skin Tympanic Membrane: Clear and mobile External Nose: No scar or anatomic deformity.  Lips, Teeth, and gums: Mucosa and teeth intact and viable Neck Neck and Trachea: Midline trachea without mass or lesion Thyroid: No mass or nodularity Lymphatics: No lymphadenopathy  Studies Reviewed: CT Neck 02/26/2020 FINDINGS: Pharynx and larynx: Normal. No mass or swelling.   Salivary glands: Stable postsurgical changes of the right parotid gland with small residual parenchyma and no evidence of masslike enhancement to suggest recurrent disease. The stylomastoid foramen remains with normal size.   The left parotid gland, bilateral submandibular glands and sublingual regions appear normal.   Thyroid: Normal.   Lymph nodes: None enlarged or abnormal density.   Vascular: Negative.   Limited intracranial: Negative.   Visualized orbits: Negative.   Mastoids and visualized paranasal sinuses: Clear.   Skeleton: Postsurgical changes from sternotomy. Degenerative changes of the cervical spine with C3-4 anterolisthesis. Bilateral temporomandibular joint degenerative changes, more pronounced on the right.   Upper chest: Negative.   Other: None.   IMPRESSION: Stable postsurgical changes of the right parotid gland without evidence of recurrent disease.  CT chest 07/18/2018 IMPRESSION: Stable sub-cm pulmonary nodules, consistent with benign postinflammatory etiology. No active disease within the thorax.    Surgical pathology 11/16/2014   MBS - no oropharyngeal dysphagia   Esophagram 05/21/23 FINDINGS: Swallowing: Appears normal. No vestibular penetration or aspiration seen.   Pharynx: Unremarkable.   Esophagus: Normal appearance.   Esophageal motility: Moderate dysmotility with tertiary contractions.   Hiatal Hernia: None.   Gastroesophageal reflux: None visualized.   Ingested 13mm barium tablet: Passed normally.   Other: None.    IMPRESSION: Moderate dysmotility with tertiary contractions.  Assessment/Plan: Encounter Diagnoses  Name Primary?   Esophageal dysmotility Yes   Dysphagia, unspecified type    H/O superficial parotidectomy    Malignant tumor of parotid gland (HCC)    Gastroesophageal reflux disease, unspecified whether esophagitis present    History of radiation to head and neck region      Assessment and Plan    Dysphagia Intermittent difficulty swallowing, primarily with solid foods. No weight loss reported. No concerning findings on scope exam today. Likely age-related decline in swallowing function, but further evaluation needed to rule out other causes and determine if sx are due to oropharyngeal vs esophageal dysphagia. Explained that a swallow study will help identify if there are any obstructions or strictures in the esophagus.  - Order swallow study MBS esophagram - Maintain current diet until swallow study results are available  Adenocarcinoma of the right Parotid Gland Diagnosed and treated in 2016 with surgical resection and radiation therapy. No recurrence noted on recent examination. - Continue routine follow-up with oncology as scheduled   Follow-up - Schedule follow-up appointment after swallow study results are available  General Health Maintenance None - No additional health maintenance recommendations at this time.     Update 05/27/2023 Assessment  and Plan    Dysphagia  Esophageal Dysmotility on esophaagram Moderate esophageal dysmotility identified on esophagram, characterized by inefficient peristalsis. No stricture or anatomical abnormalities noted. Discussed further evaluation with a GI specialist, including potential upper endoscopy to assess for eosinophilic esophagitis other etiologies of her sx. MBS done and was reportedly normal. Patient's concern about potential esophageal damage and family history influenced the decision for further evaluation (relative with hx of  cancer). - Refer to GI specialist for further evaluation and management possible EGD if indicated  - Consider upper endoscopy to assess for eosinophilic esophagitis   She reports improvement in swallowing difficulties. Modified barium swallow test showed no abnormalities. Advised increased fluid intake post-meals to aid swallowing.  - she will return if swallowing difficulties return/will consider swallow therapy in the future   Follow-up - GI specialist from [facility] to contact for appointment scheduling - Defer decision on reflux medications to GI specialist post-assessment.         Ashok Croon, MD Otolaryngology [facility] Phone: [phone] Fax: [phone]    05/28/2023, 12:33 PM

## 2023-05-30 ENCOUNTER — Encounter: Payer: Self-pay | Admitting: Gastroenterology

## 2023-05-30 ENCOUNTER — Ambulatory Visit: Payer: Medicare HMO

## 2023-07-01 ENCOUNTER — Encounter: Payer: Self-pay | Admitting: *Deleted

## 2023-07-02 ENCOUNTER — Ambulatory Visit: Payer: Medicare HMO | Attending: Cardiology | Admitting: Cardiology

## 2023-07-02 ENCOUNTER — Encounter: Payer: Self-pay | Admitting: Cardiology

## 2023-07-02 VITALS — BP 132/76 | HR 72 | Ht 62.0 in | Wt 163.4 lb

## 2023-07-02 DIAGNOSIS — I10 Essential (primary) hypertension: Secondary | ICD-10-CM | POA: Diagnosis not present

## 2023-07-02 DIAGNOSIS — R011 Cardiac murmur, unspecified: Secondary | ICD-10-CM | POA: Diagnosis not present

## 2023-07-02 MED ORDER — CARVEDILOL 6.25 MG PO TABS: 6.2500 mg | ORAL_TABLET | Freq: Two times a day (BID) | ORAL | 6 refills | Status: DC

## 2023-07-02 MED ORDER — AMLODIPINE BESYLATE 5 MG PO TABS: 5.0000 mg | ORAL_TABLET | Freq: Every day | ORAL | 6 refills | Status: DC

## 2023-07-02 NOTE — Progress Notes (Signed)
 Clinical Summary Ms. Vanosdol is a 83 y.o.female seen today for follow up of the following medical problems.      1.Syncope - seen in ER 04/28/2021 - was in kitchen at counter on the phone. No prior issues day leading up - lost her hearing, felt dizzy and fell to floor. +LOC. Next thing she remembers family was trying to get her up -felt like she needed to have a bowel movement, bowel incontinence - ER visit week prior for HTN. Started on norvasc  - ct head no acute process - ekg sr lbbb, prior EKG SR with first degree av block, LBBB -orthostatics vitals negative here - water x 5-6 glasses.   05/2021 30 day monitor was benign.   No recurrent issues.      2. HTN - she started takign norvasc in the AM and lisinopril in the evening per pcp to try to even out bp's throughout the day - home bp's 130s/70-80s - has had some recent LE, norvasc was just increased last week coinciding when the leg swelling started to progress     3. Aortic atherosclerosis -she is on ASA 81        4. Parox afib - from Dr Langston Masker notes thought to have been one isolated episode that occurred in the setting of high physical activity, excessive heat, and being on prednisone several years ago. Was followed clinically with no clear evidence of recurrence - during stress test 12/2013 recurrent episode of afib during exercise stress test - no recent palpitations - we discussed anticoag at last visit however with her upcoming surgeries she elected to stay on ASA.  05/2021 30 day monitor was benign.  -very limited episodes, has not been committed to anticoag - no recent palpitations.    5. 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 Arm numbness - ER visit Jan 2025 with neck and arm numbness - no recurrence Past Medical History:  Diagnosis Date   Bulging lumbar disc    DJD (degenerative joint disease)     of lumbosacral spine with a history of sciatica   HTN (hypertension)    Low TSH level 01/17/2010   Neuromuscular disorder (HCC)    Paroxysmal atrial fibrillation (HCC)    normal echiocardiogram and stress nuclear; low TSH with normal T3 and T4    PONV (postoperative nausea and vomiting)      Allergies  Allergen Reactions   Penicillins Other (See Comments)    Has patient had a PCN reaction causing immediate rash, facial/tongue/throat swelling, SOB or lightheadedness with hypotension: No Has patient had a PCN reaction causing severe rash involving mucus membranes or skin necrosis: No Has patient had a PCN reaction that required hospitalization: No Has patient had a PCN reaction occurring within the last 10 years: No If all of the above answers are "NO", then may proceed with Cephalosporin use.    Unknown "weird tingling feeling in legs" Has patient had a PCN reaction causing immediate rash, facial/tongue/throat swelling, SOB or lightheadedness with hypotension: No Has patient had a PCN reaction causing severe rash involving mucus membranes or skin necrosis: No Has patient had a PCN reaction that required hospitalization: No Has patient had a PCN reaction occurring within the last 10 years: No If all of the above answers are "NO", then may proceed with Cephalosporin use.    Unknown "weird tingling feeling  in legs" Has patient had a PCN reaction causing immediate rash, facial/tongue/throat swelling, SOB or lightheadedness with hypotension: No Has patient had a PCN reaction causing severe rash involving mucus membranes or skin necrosis: No Has patient had a PCN reaction that required hospitalization: No Has patient had a PCN reaction occurring within the last 10 years: No If all of the above answers are "NO", then may proceed with Cephalosporin use.    Unknown "weird tingling feeling in legs"   Sulfa Antibiotics Hives   Sulfur Other (See Comments)   Sulfonamide Derivatives  Other (See Comments)    Leg Pain     Current Outpatient Medications  Medication Sig Dispense Refill   amLODipine (NORVASC) 5 MG tablet Take 5 mg by mouth daily.     aspirin EC 81 MG tablet Take 81 mg by mouth daily.     famotidine (PEPCID) 20 MG tablet Take 20 mg by mouth 2 (two) times daily.     ipratropium (ATROVENT) 0.06 % nasal spray Place 2 sprays into both nostrils 2 (two) times daily.     levothyroxine (SYNTHROID) 75 MCG tablet Take 75 mcg by mouth daily before breakfast.     lisinopril (ZESTRIL) 40 MG tablet Take 40 mg by mouth daily.      metoprolol tartrate (LOPRESSOR) 25 MG tablet Take 1 tablet by mouth 2 (two) times daily.     montelukast (SINGULAIR) 10 MG tablet Take 1 tablet by mouth at bedtime.     Multiple Vitamins-Minerals (HAIR/SKIN/NAILS PO) Take 1 tablet by mouth every morning.      Omega-3 Fatty Acids (OMEGA-3 2100 PO) Take 1 capsule by mouth every morning.      Polyethyl Glycol-Propyl Glycol (SYSTANE) 0.4-0.3 % SOLN Apply 1 drop to eye as needed.     Probiotic Product (PROBIOTIC-10 PO) Take 1 capsule by mouth daily.     senna-docusate (SENOKOT-S) 8.6-50 MG tablet Take 1 tablet by mouth daily.     zinc gluconate 50 MG tablet Take 50 mg by mouth daily.     No current facility-administered medications for this visit.     Past Surgical History:  Procedure Laterality Date   ABDOMINAL HYSTERECTOMY     w/o oophorectomy   APPENDECTOMY     BUNIONECTOMY     bilateral   CHOLECYSTECTOMY     colonoscopy  03/09/15   COLONOSCOPY N/A 03/09/2015   Procedure: COLONOSCOPY;  Surgeon: Ruby Corporal, MD;  [facility];  Service: Endoscopy;  Laterality: N/A;  830   ESOPHAGOGASTRODUODENOSCOPY  12/27/2011   Procedure: ESOPHAGOGASTRODUODENOSCOPY (EGD);  Surgeon: Ruby Corporal, MD;  [facility];  Service: Endoscopy;  Laterality: N/A;  250   HEMORRHOID SURGERY N/A 06/20/2015   Procedure: HEMORRHOIDECTOMY;  Surgeon: Alanda Allegra, MD;  Location: [facility];   Service: General;  Laterality: N/A;   PAROTIDECTOMY Right 11/16/2014   Procedure: RIGHT PAROTIDECTOMY;  Surgeon: Reynold Caves, MD;  Location: [facility];  Service: ENT;  Laterality: Right;   RESECTION OF MEDIASTINAL MASS N/A 10/01/2014   Procedure: RESECTION OF MEDIASTINAL MASS;  Surgeon: Norita Beauvais, MD;  Location: [facility] OR;  Service: Thoracic;  Laterality: N/A;   STERNOTOMY N/A 10/01/2014   Procedure: STERNOTOMY;  Surgeon: Norita Beauvais, MD;  Location: [facility] OR;  Service: Thoracic;  Laterality: N/A;   TONSILLECTOMY     TONSILLECTOMY     VEIN LIGATION AND STRIPPING       Allergies  Allergen Reactions   Penicillins Other (See Comments)  Has patient had a PCN reaction causing immediate rash, facial/tongue/throat swelling, SOB or lightheadedness with hypotension: No Has patient had a PCN reaction causing severe rash involving mucus membranes or skin necrosis: No Has patient had a PCN reaction that required hospitalization: No Has patient had a PCN reaction occurring within the last 10 years: No If all of the above answers are "NO", then may proceed with Cephalosporin use.    Unknown "weird tingling feeling in legs" Has patient had a PCN reaction causing immediate rash, facial/tongue/throat swelling, SOB or lightheadedness with hypotension: No Has patient had a PCN reaction causing severe rash involving mucus membranes or skin necrosis: No Has patient had a PCN reaction that required hospitalization: No Has patient had a PCN reaction occurring within the last 10 years: No If all of the above answers are "NO", then may proceed with Cephalosporin use.    Unknown "weird tingling feeling in legs" Has patient had a PCN reaction causing immediate rash, facial/tongue/throat swelling, SOB or lightheadedness with hypotension: No Has patient had a PCN reaction causing severe rash involving mucus membranes or skin necrosis: No Has patient had a PCN reaction that required  hospitalization: No Has patient had a PCN reaction occurring within the last 10 years: No If all of the above answers are "NO", then may proceed with Cephalosporin use.    Unknown "weird tingling feeling in legs"   Sulfa Antibiotics Hives   Sulfur Other (See Comments)   Sulfonamide Derivatives Other (See Comments)    Leg Pain      Family History  Problem Relation Age of Onset   Heart disease Father    Hypertension Father    Diabetes Mother    Hypertension Mother      Social History Ms. Tal reports that she has never smoked. She has never used smokeless tobacco. Ms. Mclamb reports current alcohol use.    Physical Examination Today's Vitals   07/02/23 1339  BP: 132/76  Pulse: 72  SpO2: 97%  Weight: 163 lb 6.4 oz (74.1 kg)  Height: 5\' 2"  (1.575 m)   Body mass index is 29.89 kg/m.  Gen: resting comfortably, no acute distress HEENT: no scleral icterus, pupils equal round and reactive, no palptable cervical adenopathy,  CV: RRR, 2/6 systolic murmur rusb, no jvd Resp: Clear to auscultation bilaterally GI: abdomen is soft, non-tender, non-distended, normal bowel sounds, no hepatosplenomegaly MSK: extremities are warm, 1+ bilateral LE edema Skin: warm, no rash Neuro:  no focal deficits Psych: appropriate affect   Diagnostic Studies 12/2013 MPI IMPRESSION: 1. Probable soft tissue attenuation affecting the inferior/inferolateral wall without definitive evidence of ischemia.   2. No focal LV wall motion abnormalities.   3. Left ventricular ejection fraction 65%   4. Transient atrial fibrillation noted during exercise, associated with chest discomfort as well as PVCs versus aberrently conducted beats.   5. Low-risk stress test findings*.        Assessment and Plan  1. HTN - controlled however with recent increase in her norvasc to 10mg  started developing significant LE edema - lower norvac to 5mg . Continue lisinopril 40mg  daily. Change lopressor to  coreg 6.25mg  bid for more potent bp effect. She will submit bp log in next few weeks  2. Heart murmur - obtain echo      Antoine Poche, M.D.

## 2023-07-02 NOTE — Patient Instructions (Addendum)
 Medication Instructions:   Decrease Norvasc 5mg  daily Stop Lopressor (Metoprolol tart) Begin Coreg 6.25mg  twice a day  Continue all other medications.     Labwork:  none  Testing/Procedures:  Your physician has requested that you have an echocardiogram. Echocardiography is a painless test that uses sound waves to create images of your heart. It provides your doctor with information about the size and shape of your heart and how well your heart's chambers and valves are working. This procedure takes approximately one hour. There are no restrictions for this procedure. Please do NOT wear cologne, perfume, aftershave, or lotions (deodorant is allowed). Please arrive 15 minutes prior to your appointment time.  Please note: We ask at that you not bring children with you during ultrasound (echo/ vascular) testing. Due to room size and safety concerns, children are not allowed in the ultrasound rooms during exams. Our front office staff cannot provide observation of children in our lobby area while testing is being conducted. An adult accompanying a patient to their appointment will only be allowed in the ultrasound room at the discretion of the ultrasound technician under special circumstances. We apologize for any inconvenience. Office will contact with results via phone, letter or mychart.     Follow-Up:  6 months   Any Other Special Instructions Will Be Listed Below (If Applicable).  Please keep log of BP readings and drop off for provider review when come for Echo.   If you need a refill on your cardiac medications before your next appointment, please call your pharmacy.

## 2023-07-16 ENCOUNTER — Encounter: Payer: Self-pay | Admitting: Cardiology

## 2023-07-16 ENCOUNTER — Ambulatory Visit: Attending: Cardiology

## 2023-07-16 DIAGNOSIS — R011 Cardiac murmur, unspecified: Secondary | ICD-10-CM

## 2023-07-16 LAB — ECHOCARDIOGRAM COMPLETE
AR max vel: 2.22 cm2
AV Area VTI: 2.21 cm2
AV Area mean vel: 2.13 cm2
AV Mean grad: 9 mmHg
AV Peak grad: 16.5 mmHg
AV Vena cont: 0.5 cm
Ao pk vel: 2.03 m/s
Area-P 1/2: 2.84 cm2
Calc EF: 68.6 %
MV M vel: 5.18 m/s
MV Peak grad: 107.3 mmHg
MV VTI: 3.21 cm2
P 1/2 time: 740 ms
S' Lateral: 2 cm
Single Plane A2C EF: 69.5 %
Single Plane A4C EF: 66.1 %

## 2023-07-19 ENCOUNTER — Ambulatory Visit: Admitting: Gastroenterology

## 2023-07-30 ENCOUNTER — Ambulatory Visit: Payer: Self-pay

## 2023-07-30 MED ORDER — CARVEDILOL 12.5 MG PO TABS: 12.5000 mg | ORAL_TABLET | Freq: Two times a day (BID) | ORAL | 1 refills | Status: DC

## 2023-08-22 ENCOUNTER — Ambulatory Visit: Payer: Self-pay | Admitting: Cardiology

## 2023-08-27 ENCOUNTER — Ambulatory Visit: Admitting: Gastroenterology

## 2024-01-01 ENCOUNTER — Encounter (INDEPENDENT_AMBULATORY_CARE_PROVIDER_SITE_OTHER): Payer: Self-pay | Admitting: Gastroenterology

## 2024-01-03 ENCOUNTER — Ambulatory Visit: Admitting: Cardiology

## 2024-01-15 ENCOUNTER — Other Ambulatory Visit: Payer: Self-pay | Admitting: Cardiology

## 2024-02-26 ENCOUNTER — Ambulatory Visit (INDEPENDENT_AMBULATORY_CARE_PROVIDER_SITE_OTHER): Payer: Medicare HMO | Admitting: Otolaryngology

## 2024-03-09 ENCOUNTER — Ambulatory Visit: Attending: Cardiology | Admitting: Cardiology

## 2024-03-09 VITALS — BP 140/82 | HR 71 | Ht 62.0 in | Wt 164.6 lb

## 2024-03-09 DIAGNOSIS — I10 Essential (primary) hypertension: Secondary | ICD-10-CM | POA: Diagnosis not present

## 2024-03-09 DIAGNOSIS — I340 Nonrheumatic mitral (valve) insufficiency: Secondary | ICD-10-CM

## 2024-03-09 MED ORDER — CARVEDILOL 25 MG PO TABS: 25.0000 mg | ORAL_TABLET | Freq: Two times a day (BID) | ORAL | 6 refills | Status: AC

## 2024-03-09 NOTE — Progress Notes (Signed)
 "     Clinical Summary [name] is a 83 y.o.female seen today for follow up of the following medical problems.      1.Syncope - seen in ER 04/28/2021 - was in kitchen at counter on the phone. No prior issues day leading up - lost her hearing, felt dizzy and fell to floor. +LOC. Next thing she remembers family was trying to get her up -felt like she needed to have a bowel movement, bowel incontinence - ER visit week prior for HTN. Started on norvasc   - ct head no acute process - ekg sr lbbb, prior EKG SR with first degree av block, LBBB -orthostatics vitals negative here - water  x 5-6 glasses.   05/2021 30 day monitor was benign.    No recurrent issues.      2. HTN - home bp's 120s/80s - recent other provider visits 120s-130s/70s   3.Leg edema - some ongoing edema, wears compression stockings.  - unclear if related to norvasc    4. Aortic atherosclerosis -she is on ASA 81       5. Parox afib - from Dr Terre notes thought to have been one isolated episode that occurred in the setting of high physical activity, excessive heat, and being on prednisone several years ago. Was followed clinically with no clear evidence of recurrence - during stress test 12/2013 recurrent episode of afib during exercise stress test - no recent palpitations - we discussed anticoag at last visit however with her upcoming surgeries she elected to stay on ASA.  05/2021 30 day monitor was benign.  -very limited episodes, has not been committed to anticoag - no recent palpitations.    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 90 tablet 2   aspirin  EC 81 MG tablet Take 81 mg by mouth daily.     carvedilol  (COREG ) 12.5 MG tablet Take 1 tablet (12.5 mg total) by mouth 2 (two) times daily. 180 tablet 1   famotidine  (PEPCID ) 20 MG tablet Take 20 mg by mouth 2 (two) times daily.     ipratropium (ATROVENT) 0.06 % nasal spray Place 2 sprays into both nostrils 2 (two) times daily.     levothyroxine (SYNTHROID) 75 MCG tablet Take 75 mcg by mouth daily before breakfast.     lisinopril  (ZESTRIL ) 40 MG tablet Take 40 mg by mouth daily.      Multiple Vitamins-Minerals (HAIR/SKIN/NAILS PO) Take 1 tablet by mouth every morning.      Omega-3 Fatty Acids (OMEGA-3 2100 PO) Take 1 capsule by mouth every morning.      Polyethyl Glycol-Propyl Glycol (SYSTANE) 0.4-0.3 % SOLN Apply 1 drop to eye as needed.     Probiotic Product (PROBIOTIC-10 PO) Take 1 capsule by mouth daily.  zinc  gluconate 50 MG tablet Take 50 mg by mouth daily.     No current facility-administered medications for this visit.     Past Surgical History:  Procedure Laterality Date   ABDOMINAL HYSTERECTOMY     w/o oophorectomy   APPENDECTOMY     BUNIONECTOMY     bilateral   CHOLECYSTECTOMY     colonoscopy  03/09/15   COLONOSCOPY N/A 03/09/2015   Procedure: COLONOSCOPY;  Surgeon: Claudis RAYMOND Rivet, MD;  Location: [facility];  Service: Endoscopy;  Laterality: N/A;  830   ESOPHAGOGASTRODUODENOSCOPY  12/27/2011   Procedure: ESOPHAGOGASTRODUODENOSCOPY (EGD);  Surgeon: Claudis RAYMOND Rivet, MD;  Location: [facility];  Service: Endoscopy;  Laterality: N/A;  250   HEMORRHOID SURGERY N/A 06/20/2015   Procedure: HEMORRHOIDECTOMY;  Surgeon: Oneil Budge, MD;  Location: [facility];   Service: General;  Laterality: N/A;   PAROTIDECTOMY Right 11/16/2014   Procedure: RIGHT PAROTIDECTOMY;  Surgeon: Daniel Moccasin, MD;  Location: [facility];  Service: ENT;  Laterality: Right;   RESECTION OF MEDIASTINAL MASS N/A 10/01/2014   Procedure: RESECTION OF MEDIASTINAL MASS;  Surgeon: Dallas KATHEE Jude, MD;  Location: [facility] OR;  Service: Thoracic;  Laterality: N/A;   STERNOTOMY N/A 10/01/2014   Procedure: STERNOTOMY;  Surgeon: Dallas KATHEE Jude, MD;  Location: [facility] OR;  Service: Thoracic;  Laterality: N/A;   TONSILLECTOMY     TONSILLECTOMY     VEIN LIGATION AND STRIPPING       Allergies[2]    Family History  Problem Relation Age of Onset   Heart disease Father    Hypertension Father    Diabetes Mother    Hypertension Mother      Social History [name] reports that she has never smoked. She has never used smokeless tobacco. [name] reports current alcohol use.    Physical Examination Today's Vitals   03/09/24 1350 03/09/24 1414  BP: (!) 160/88 (!) 140/82  Pulse: 71   Weight: 164 lb 9.6 oz (74.7 kg)   Height: 5' 2 (1.575 m)    Body mass index is 30.11 kg/m.  Gen: resting comfortably, no acute distress HEENT: no scleral icterus, pupils equal round and reactive, no palptable cervical adenopathy,  CV: RRR, no m/rg, no jvd Resp: Clear to auscultation bilaterally GI: abdomen is soft, non-tender, non-distended, normal bowel sounds, no hepatosplenomegaly MSK: extremities are warm, no edema.  Skin: warm, no rash Neuro:  no focal deficits Psych: appropriate affect   Diagnostic Studies  12/2013 MPI IMPRESSION: 1. Probable soft tissue attenuation affecting the inferior/inferolateral wall without definitive evidence of ischemia.   2. No focal LV wall motion abnormalities.   3. Left ventricular ejection fraction 65%   4. Transient atrial fibrillation noted during exercise, associated with chest discomfort as well as PVCs versus aberrently  conducted beats.   5. Low-risk stress test findings*.    06/2023 echo 1. Left ventricular ejection fraction, by estimation, is 65 to 70%. The  left ventricle has normal function. The left ventricle has no regional  wall motion abnormalities. There is moderate concentric left ventricular  hypertrophy. Left ventricular  diastolic parameters are consistent with Grade I diastolic dysfunction  (impaired relaxation).   2. Right ventricular systolic function is normal. The right ventricular  size is normal. There is normal pulmonary artery systolic pressure. The  estimated right ventricular systolic pressure is 26.2 mmHg.   3. The mitral valve is grossly normal. Mild to moderate mitral valve  regurgitation.   4. The aortic  valve is tricuspid. There is mild calcification of the  aortic valve. Aortic valve regurgitation is mild. Aortic valve  sclerosis/calcification is present, without any evidence of aortic  stenosis. Aortic regurgitation PHT measures 740 msec.  Aortic valve mean gradient measures 9.0 mmHg.   5. The inferior vena cava is normal in size with greater than 50%  respiratory variability, suggesting right atrial pressure of 3 mmHg.    Assessment and Plan   1. HTN - elevated here but home numbers and bp's at other provider visits have been at goal - unclear if norvasc  related to LE edema, will d/c. Increase coreg  to 25mg  bid.    2. Miltral regurgitation - mild to mod by recent echo, continue to monitor    Dorn PHEBE Ross, M.D.     [1]  Allergies Allergen Reactions   Penicillins Other (See Comments)    Has patient had a PCN reaction causing immediate rash, facial/tongue/throat swelling, SOB or lightheadedness with hypotension: No Has patient had a PCN reaction causing severe rash involving mucus membranes or skin necrosis: No Has patient had a PCN reaction that required hospitalization: No Has patient had a PCN reaction occurring within the last 10 years: No If  all of the above answers are NO, then may proceed with Cephalosporin use.    Unknown weird tingling feeling in legs Has patient had a PCN reaction causing immediate rash, facial/tongue/throat swelling, SOB or lightheadedness with hypotension: No Has patient had a PCN reaction causing severe rash involving mucus membranes or skin necrosis: No Has patient had a PCN reaction that required hospitalization: No Has patient had a PCN reaction occurring within the last 10 years: No If all of the above answers are NO, then may proceed with Cephalosporin use.    Unknown weird tingling feeling in legs Has patient had a PCN reaction causing immediate rash, facial/tongue/throat swelling, SOB or lightheadedness with hypotension: No Has patient had a PCN reaction causing severe rash involving mucus membranes or skin necrosis: No Has patient had a PCN reaction that required hospitalization: No Has patient had a PCN reaction occurring within the last 10 years: No If all of the above answers are NO, then may proceed with Cephalosporin use.    Unknown weird tingling feeling in legs   Sulfa Antibiotics Hives   Sulfur Other (See Comments)   Sulfonamide Derivatives Other (See Comments)    Leg Pain  [2]  Allergies Allergen Reactions   Penicillins Other (See Comments)    Has patient had a PCN reaction causing immediate rash, facial/tongue/throat swelling, SOB or lightheadedness with hypotension: No Has patient had a PCN reaction causing severe rash involving mucus membranes or skin necrosis: No Has patient had a PCN reaction that required hospitalization: No Has patient had a PCN reaction occurring within the last 10 years: No If all of the above answers are NO, then may proceed with Cephalosporin use.    Unknown weird tingling feeling in legs Has patient had a PCN reaction causing immediate rash, facial/tongue/throat swelling, SOB or lightheadedness with hypotension: No Has patient  had a PCN reaction causing severe rash involving mucus membranes or skin necrosis: No Has patient had a PCN reaction that required hospitalization: No Has patient had a PCN reaction occurring within the last 10 years: No If all of the above answers are NO, then may proceed with Cephalosporin use.    Unknown weird tingling feeling in legs Has patient had a PCN reaction causing immediate rash, facial/tongue/throat swelling, SOB or lightheadedness  with hypotension: No Has patient had a PCN reaction causing severe rash involving mucus membranes or skin necrosis: No Has patient had a PCN reaction that required hospitalization: No Has patient had a PCN reaction occurring within the last 10 years: No If all of the above answers are NO, then may proceed with Cephalosporin use.    Unknown weird tingling feeling in legs   Sulfa Antibiotics Hives   Sulfur Other (See Comments)   Sulfonamide Derivatives Other (See Comments)    Leg Pain   "

## 2024-03-09 NOTE — Patient Instructions (Signed)
 Medication Instructions:   Stop Amlodipine  (Norvasc ) Increase Coreg  to 25mg  twice a day   Continue all other medications.     Labwork:  none  Testing/Procedures:  none  Follow-Up:  6 months   Any Other Special Instructions Will Be Listed Below (If Applicable).   If you need a refill on your cardiac medications before your next appointment, please call your pharmacy.

## 2024-03-09 NOTE — Addendum Note (Signed)
 Addended by: Tieasha Larsen G on: 03/09/2024 02:26 PM   Modules accepted: Orders

## 2024-03-23 ENCOUNTER — Ambulatory Visit (INDEPENDENT_AMBULATORY_CARE_PROVIDER_SITE_OTHER): Admitting: Otolaryngology

## 2024-04-06 ENCOUNTER — Encounter (INDEPENDENT_AMBULATORY_CARE_PROVIDER_SITE_OTHER): Payer: Self-pay | Admitting: Otolaryngology

## 2024-04-06 ENCOUNTER — Ambulatory Visit (INDEPENDENT_AMBULATORY_CARE_PROVIDER_SITE_OTHER): Admitting: Otolaryngology

## 2024-04-06 VITALS — BP 134/84 | HR 63 | Ht 62.0 in | Wt 160.0 lb

## 2024-04-06 DIAGNOSIS — H6123 Impacted cerumen, bilateral: Secondary | ICD-10-CM | POA: Diagnosis not present

## 2024-04-06 DIAGNOSIS — R0982 Postnasal drip: Secondary | ICD-10-CM | POA: Insufficient documentation

## 2024-04-06 DIAGNOSIS — G500 Trigeminal neuralgia: Secondary | ICD-10-CM | POA: Diagnosis not present

## 2024-04-06 DIAGNOSIS — J310 Chronic rhinitis: Secondary | ICD-10-CM | POA: Insufficient documentation

## 2024-04-06 MED ORDER — IPRATROPIUM BROMIDE 0.06 % NA SOLN: 2.0000 | Freq: Two times a day (BID) | NASAL | 12 refills | Status: AC | PRN

## 2024-04-06 NOTE — Progress Notes (Signed)
 Patient ID: [name], female   DOB: October 26, 1940, 84 y.o.   MRN: [id number]  Follow up: Chronic postnasal drainage, chronic rhinitis New complaint: Trigeminal neuralgia  History of Present Illness [name] is an 84 year old female who returns today for follow-up evaluation.  Over the past two months, she has experienced intermittent, severe right-sided jaw pain radiating from the right jaw area superiorly. She describes the pain as extremely intense during episodes, despite her high pain tolerance. Dental evaluation by her dentist, endodontist, and dental surgeon revealed no dental pathology on examination or imaging. She was diagnosed with trigeminal neuralgia.  She reports persistent nasal drainage and uses ipratropium nasal spray for symptom control. She is currently out of ipratropium and requests a refill.  She denies any fever or visual change.  Exam: General: Communicates without difficulty, well nourished, no acute distress. Head: Normocephalic, no evidence injury, no tenderness, facial buttresses intact without stepoff. Face/sinus: No tenderness to palpation and percussion. Facial movement is normal and symmetric. Eyes: PERRL, EOMI. No scleral icterus, conjunctivae clear. Neuro: CN II exam reveals vision grossly intact.  No nystagmus at any point of gaze. Ears: Auricles well formed without lesions.  Bilateral cerumen impaction.  Nose: External evaluation reveals normal support and skin without lesions.  Dorsum is intact.  Anterior rhinoscopy reveals congested mucosa over anterior aspect of inferior turbinates and intact septum.  No purulence noted. Oral:  Oral cavity and oropharynx are intact, symmetric, without erythema or edema.  Mucosa is moist without lesions. Neck: Full range of motion without pain.  There is no significant lymphadenopathy.  No masses palpable.  Thyroid  bed within normal limits to palpation.  Right parotid incision is well-healed.  Trachea is midline. Neuro:  CN  2-12 grossly intact.   Procedure: Bilateral cerumen disimpaction Anesthesia: None Description: Under the operating microscope, the cerumen is carefully removed with a combination of cerumen currette, alligator forceps, and suction catheters.  After the cerumen is removed, the TMs are noted to be normal.  No mass, erythema, or lesions. The patient tolerated the procedure well.    Assessment & Plan Trigeminal neuralgia She experiences intermittent, severe right-sided jaw pain consistent with trigeminal neuralgia. Extensive dental evaluation by her dentist, endodontist, and dental surgeon excluded dental etiology. The pain is unlikely to be related to her prior parotid surgery in 2016, and examination revealed no evidence of recurrent parotid tumor.  Chronic rhinitis She has persistent nasal drainage and previously responded to ipratropium nasal spray. - Refilled ipratropium nasal spray.  Impacted cerumen, bilateral She had significant bilateral cerumen impaction, more pronounced on one side, likely exacerbated by hearing aid use. - Otomicroscopy with bilateral cerumen disimpaction.

## 2024-05-19 ENCOUNTER — Ambulatory Visit: Admitting: Diagnostic Neuroimaging

## 2024-08-12 ENCOUNTER — Ambulatory Visit: Admitting: Diagnostic Neuroimaging
